# Patient Record
Sex: Female | Born: 1950 | Race: Black or African American | Hispanic: No | Marital: Single | Smoking: Former smoker
Health system: Southern US, Community
[De-identification: ages and names within clinical notes are randomized; demographics above are authoritative.]

## PROBLEM LIST (undated history)

## (undated) DIAGNOSIS — K589 Irritable bowel syndrome without diarrhea: Secondary | ICD-10-CM

## (undated) DIAGNOSIS — I071 Rheumatic tricuspid insufficiency: Secondary | ICD-10-CM

## (undated) DIAGNOSIS — Z923 Personal history of irradiation: Secondary | ICD-10-CM

## (undated) DIAGNOSIS — M1990 Unspecified osteoarthritis, unspecified site: Secondary | ICD-10-CM

## (undated) DIAGNOSIS — Z803 Family history of malignant neoplasm of breast: Secondary | ICD-10-CM

## (undated) DIAGNOSIS — I340 Nonrheumatic mitral (valve) insufficiency: Secondary | ICD-10-CM

## (undated) DIAGNOSIS — K219 Gastro-esophageal reflux disease without esophagitis: Secondary | ICD-10-CM

## (undated) DIAGNOSIS — M1380 Other specified arthritis, unspecified site: Secondary | ICD-10-CM

## (undated) DIAGNOSIS — M48061 Spinal stenosis, lumbar region without neurogenic claudication: Secondary | ICD-10-CM

## (undated) DIAGNOSIS — K224 Dyskinesia of esophagus: Secondary | ICD-10-CM

## (undated) DIAGNOSIS — F419 Anxiety disorder, unspecified: Secondary | ICD-10-CM

## (undated) DIAGNOSIS — Z4651 Encounter for fitting and adjustment of gastric lap band: Secondary | ICD-10-CM

## (undated) DIAGNOSIS — G4730 Sleep apnea, unspecified: Secondary | ICD-10-CM

## (undated) DIAGNOSIS — E669 Obesity, unspecified: Secondary | ICD-10-CM

## (undated) DIAGNOSIS — R42 Dizziness and giddiness: Secondary | ICD-10-CM

## (undated) DIAGNOSIS — M47816 Spondylosis without myelopathy or radiculopathy, lumbar region: Secondary | ICD-10-CM

## (undated) DIAGNOSIS — G4733 Obstructive sleep apnea (adult) (pediatric): Secondary | ICD-10-CM

## (undated) DIAGNOSIS — H269 Unspecified cataract: Secondary | ICD-10-CM

## (undated) HISTORY — DX: Obstructive sleep apnea (adult) (pediatric): G47.33

## (undated) HISTORY — DX: Other specified arthritis, unspecified site: M13.80

## (undated) HISTORY — DX: Spinal stenosis, lumbar region without neurogenic claudication: M48.061

## (undated) HISTORY — DX: Dizziness and giddiness: R42

## (undated) HISTORY — PX: BLEPHAROPLASTY: SUR158

## (undated) HISTORY — PX: CHOLECYSTECTOMY: SHX55

## (undated) HISTORY — DX: Unspecified cataract: H26.9

## (undated) HISTORY — DX: Irritable bowel syndrome, unspecified: K58.9

## (undated) HISTORY — DX: Obesity, unspecified: E66.9

## (undated) HISTORY — PX: UMBILICAL HERNIA REPAIR: SHX196

## (undated) HISTORY — DX: Spondylosis without myelopathy or radiculopathy, lumbar region: M47.816

## (undated) HISTORY — DX: Nonrheumatic mitral (valve) insufficiency: I34.0

## (undated) HISTORY — PX: INGUINAL HERNIA REPAIR: SUR1180

## (undated) HISTORY — DX: Rheumatic tricuspid insufficiency: I07.1

## (undated) HISTORY — PX: EYE SURGERY: SHX253

## (undated) HISTORY — DX: Family history of malignant neoplasm of breast: Z80.3

## (undated) HISTORY — DX: Sleep apnea, unspecified: G47.30

## (undated) HISTORY — DX: Anxiety disorder, unspecified: F41.9

## (undated) HISTORY — PX: COSMETIC SURGERY: SHX468

## (undated) HISTORY — DX: Unspecified osteoarthritis, unspecified site: M19.90

## (undated) HISTORY — DX: Encounter for fitting and adjustment of gastric lap band: Z46.51

---
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
## 1994-12-06 HISTORY — PX: OTHER SURGICAL HISTORY: SHX169

## 2004-11-20 LAB — HM COLONOSCOPY: HM Colonoscopy: NORMAL

## 2005-12-06 HISTORY — PX: LAPAROSCOPIC GASTRIC BANDING: SHX1100

## 2006-01-14 ENCOUNTER — Encounter: Payer: Self-pay | Admitting: Family Medicine

## 2006-08-01 ENCOUNTER — Encounter: Payer: Self-pay | Admitting: Family Medicine

## 2006-09-23 ENCOUNTER — Encounter: Payer: Self-pay | Admitting: Family Medicine

## 2006-09-23 LAB — CONVERTED CEMR LAB: Pap Smear: NORMAL

## 2007-02-09 DIAGNOSIS — M48061 Spinal stenosis, lumbar region without neurogenic claudication: Secondary | ICD-10-CM

## 2007-02-09 DIAGNOSIS — M47816 Spondylosis without myelopathy or radiculopathy, lumbar region: Secondary | ICD-10-CM

## 2007-02-09 HISTORY — DX: Spinal stenosis, lumbar region without neurogenic claudication: M48.061

## 2007-02-09 HISTORY — DX: Spondylosis without myelopathy or radiculopathy, lumbar region: M47.816

## 2007-03-23 DIAGNOSIS — I071 Rheumatic tricuspid insufficiency: Secondary | ICD-10-CM

## 2007-03-23 DIAGNOSIS — I340 Nonrheumatic mitral (valve) insufficiency: Secondary | ICD-10-CM

## 2007-03-23 HISTORY — DX: Rheumatic tricuspid insufficiency: I07.1

## 2007-03-23 HISTORY — DX: Nonrheumatic mitral (valve) insufficiency: I34.0

## 2007-10-16 ENCOUNTER — Encounter: Payer: Self-pay | Admitting: Family Medicine

## 2007-10-16 LAB — CONVERTED CEMR LAB
ALT: 14 units/L
BUN: 14 mg/dL
Glucose, Bld: 82 mg/dL
Potassium: 4.2 meq/L
Sodium: 138 meq/L

## 2007-12-07 HISTORY — PX: BREAST REDUCTION SURGERY: SHX8

## 2008-03-15 ENCOUNTER — Encounter: Payer: Self-pay | Admitting: Family Medicine

## 2008-03-15 LAB — CONVERTED CEMR LAB: Pap Smear: NORMAL

## 2008-06-05 ENCOUNTER — Encounter: Payer: Self-pay | Admitting: Family Medicine

## 2008-06-05 LAB — CONVERTED CEMR LAB
ALT: 29 units/L
Glucose, Bld: 99 mg/dL
HCT: 37.8 %
LDL Cholesterol: 133 mg/dL
Potassium: 5.1 meq/L
Sodium: 145 meq/L

## 2008-12-19 ENCOUNTER — Encounter: Payer: Self-pay | Admitting: Family Medicine

## 2008-12-19 LAB — CONVERTED CEMR LAB: Hgb A1c MFr Bld: 6.6 %

## 2009-03-06 LAB — CONVERTED CEMR LAB

## 2009-04-16 ENCOUNTER — Encounter: Payer: Self-pay | Admitting: Family Medicine

## 2009-04-16 LAB — CONVERTED CEMR LAB
BUN: 15 mg/dL
Glucose, Bld: 94 mg/dL
HDL: 92 mg/dL

## 2010-07-16 ENCOUNTER — Ambulatory Visit: Payer: Self-pay | Admitting: Family Medicine

## 2010-07-16 DIAGNOSIS — R42 Dizziness and giddiness: Secondary | ICD-10-CM | POA: Insufficient documentation

## 2010-07-20 LAB — CONVERTED CEMR LAB
ALT: 14 units/L (ref 0–35)
AST: 20 units/L (ref 0–37)
Albumin: 4.3 g/dL (ref 3.5–5.2)
Alkaline Phosphatase: 80 units/L (ref 39–117)
BUN: 13 mg/dL (ref 6–23)
Bilirubin, Direct: 0.1 mg/dL (ref 0.0–0.3)
CO2: 32 meq/L (ref 19–32)
Calcium: 9.8 mg/dL (ref 8.4–10.5)
Chloride: 107 meq/L (ref 96–112)
Cholesterol: 215 mg/dL — ABNORMAL HIGH (ref 0–200)
Creatinine, Ser: 0.9 mg/dL (ref 0.4–1.2)
Direct LDL: 132.3 mg/dL
GFR calc non Af Amer: 86.1 mL/min (ref >60)
Glucose, Bld: 91 mg/dL (ref 70–99)
HDL: 76.7 mg/dL (ref >39.00)
Potassium: 4.7 meq/L (ref 3.5–5.1)
Sodium: 145 meq/L (ref 135–145)
Total Bilirubin: 0.6 mg/dL (ref 0.3–1.2)
Total CHOL/HDL Ratio: 3
Total Protein: 7.2 g/dL (ref 6.0–8.3)
Triglycerides: 71 mg/dL (ref 0.0–149.0)
VLDL: 14.2 mg/dL (ref 0.0–40.0)

## 2010-07-25 ENCOUNTER — Encounter: Admission: RE | Admit: 2010-07-25 | Discharge: 2010-07-25 | Payer: Self-pay | Admitting: Family Medicine

## 2010-07-27 ENCOUNTER — Telehealth: Payer: Self-pay | Admitting: Family Medicine

## 2010-07-30 ENCOUNTER — Encounter: Payer: Self-pay | Admitting: Family Medicine

## 2010-08-05 ENCOUNTER — Encounter (INDEPENDENT_AMBULATORY_CARE_PROVIDER_SITE_OTHER): Payer: Self-pay | Admitting: *Deleted

## 2010-08-05 ENCOUNTER — Encounter: Payer: Self-pay | Admitting: Family Medicine

## 2010-08-05 DIAGNOSIS — G4733 Obstructive sleep apnea (adult) (pediatric): Secondary | ICD-10-CM | POA: Insufficient documentation

## 2010-11-05 ENCOUNTER — Telehealth: Payer: Self-pay | Admitting: Family Medicine

## 2010-12-04 ENCOUNTER — Encounter: Payer: Self-pay | Admitting: Family Medicine

## 2010-12-09 ENCOUNTER — Encounter: Payer: Self-pay | Admitting: Family Medicine

## 2010-12-09 ENCOUNTER — Encounter (INDEPENDENT_AMBULATORY_CARE_PROVIDER_SITE_OTHER): Payer: Self-pay | Admitting: *Deleted

## 2010-12-21 ENCOUNTER — Encounter: Payer: Self-pay | Admitting: Family Medicine

## 2010-12-21 ENCOUNTER — Ambulatory Visit
Admission: RE | Admit: 2010-12-21 | Discharge: 2010-12-21 | Payer: Self-pay | Source: Home / Self Care | Attending: Family Medicine | Admitting: Family Medicine

## 2010-12-21 ENCOUNTER — Other Ambulatory Visit
Admission: RE | Admit: 2010-12-21 | Discharge: 2010-12-21 | Payer: Self-pay | Source: Home / Self Care | Admitting: Family Medicine

## 2010-12-21 ENCOUNTER — Other Ambulatory Visit: Payer: Self-pay | Admitting: Family Medicine

## 2010-12-28 ENCOUNTER — Ambulatory Visit
Admission: RE | Admit: 2010-12-28 | Discharge: 2010-12-28 | Payer: Self-pay | Source: Home / Self Care | Attending: Family Medicine | Admitting: Family Medicine

## 2010-12-28 ENCOUNTER — Other Ambulatory Visit: Payer: Self-pay | Admitting: Family Medicine

## 2010-12-28 ENCOUNTER — Encounter (INDEPENDENT_AMBULATORY_CARE_PROVIDER_SITE_OTHER): Payer: Self-pay | Admitting: *Deleted

## 2010-12-29 ENCOUNTER — Encounter (INDEPENDENT_AMBULATORY_CARE_PROVIDER_SITE_OTHER): Payer: Self-pay | Admitting: *Deleted

## 2010-12-29 LAB — FECAL OCCULT BLOOD, IMMUNOCHEMICAL: Fecal Occult Bld: NEGATIVE

## 2011-01-05 NOTE — Miscellaneous (Signed)
Clinical Lists Changes  Problems: Added new problem of OBSTRUCTIVE SLEEP APNEA (ICD-327.23) Observations: Added new observation of PAST SURG HX: 1980's   Gall Bladder 1960      Tonsils/Adenoids 1996      Tummy Tuck 2007      Lap Band 2009      Breast Reduction B inguinal hernia repair 2008      Realize Laparoscopic adjustable gastric band. (08/05/2010 16:04) Added new observation of PAST MED HX: Obesity s/p Lab band IBS Anxiety Inflammatory arthritis per rheum 02/09/2007  MRI of Lumbar Spine without IV contrast - Bilateral articular facet hypertrophy and mild foraminal     stenosis at L 4-5 06/18/2008  Lexiscan augmented myocardial perfusion study:  No scintigraphic evidence of reversible ischemia or infarct. Calculated left ventricular ejection fraction of 48% 03/23/2007  Echocardiographic Report:  Borderline LVEF 50%, without segmental wall motion abnormalities.  Mild mitral regurgitation.  Mild to moderate tricuspid regurgitation. OBSTRUCTIVE SLEEP APNEA (ICD-327.23) corrected with use of CPAP at 7 cm. of water pressure FITTING & ADJUSTMENT OF GASTRIC LAP BAND (ICD-V53.51) INTERMITTENT VERTIGO (ICD-780.4) FAMILY HISTORY BREAST CANCER 1ST DEGREE RELATIVE <50 (ICD-V16.3)   (08/05/2010 16:04) Added new observation of MAMMOGRAM: normal (09/23/2009 16:15) Added new observation of PAP SMEAR: normal (04/16/2009 16:06) Added new observation of VIT D 1,25OH: 17.6  (04/16/2009 16:04) Added new observation of CREATININE: 1.05 mg/dL (62/13/0865 78:46) Added new observation of BUN: 15 mg/dL (96/29/5284 13:24) Added new observation of BG RANDOM: 94 mg/dL (40/09/2724 36:64) Added new observation of VLDL: 9 mg/dL (40/34/7425 95:63) Added new observation of LDL: 102 mg/dL (87/56/4332 95:18) Added new observation of HDL: 92 mg/dL (84/16/6063 01:60) Added new observation of TRIGLYC TOT: 46 mg/dL (10/93/2355 73:22) Added new observation of CHOLESTEROL: 203 mg/dL (02/54/2706 23:76) Added new  observation of HGBA1C: 6.6 % (12/19/2008 16:04) Added new observation of HCT: 37.8 % (06/05/2008 16:04) Added new observation of HGB: 12.6 g/dL (28/31/5176 16:07) Added new observation of HGBA1C: 6.2 % (06/05/2008 16:04) Added new observation of PROTEIN, TOT: 7.4 g/dL (37/09/6268 48:54) Added new observation of SGPT (ALT): 29 units/L (06/05/2008 16:04) Added new observation of SGOT (AST): 26 units/L (06/05/2008 16:04) Added new observation of BUN: 13 mg/dL (62/70/3500 93:81) Added new observation of BG RANDOM: 99 mg/dL (82/99/3716 96:78) Added new observation of K SERUM: 5.1 meq/L (06/05/2008 16:04) Added new observation of NA: 145 meq/L (06/05/2008 16:04) Added new observation of VLDL: 14 mg/dL (93/81/0175 10:25) Added new observation of LDL: 133 mg/dL (85/27/7824 23:53) Added new observation of HDL: 70 mg/dL (61/44/3154 00:86) Added new observation of TRIGLYC TOT: 68 mg/dL (76/19/5093 26:71) Added new observation of CHOLESTEROL: 217 mg/dL (24/58/0998 33:82) Added new observation of PAP SMEAR: normal  (03/15/2008 16:05) Added new observation of HGBA1C: 6.1 % (10/16/2007 16:04) Added new observation of PROTEIN, TOT: 6.9 g/dL (50/53/9767 34:19) Added new observation of SGPT (ALT): 14 units/L (10/16/2007 16:04) Added new observation of SGOT (AST): 21 units/L (10/16/2007 16:04) Added new observation of CREATININE: 1.02 mg/dL (37/90/2409 73:53) Added new observation of BUN: 14 mg/dL (29/92/4268 34:19) Added new observation of BG RANDOM: 82 mg/dL (62/22/9798 92:11) Added new observation of K SERUM: 4.2 meq/L (10/16/2007 16:04) Added new observation of NA: 138 meq/L (10/16/2007 16:04) Added new observation of PAP SMEAR: normal  (09/23/2006 16:05) Added new observation of COLONOSCOPY: normal  (11/20/2004 16:18)      Preventive Care Screening  Mammogram:    Date:  09/23/2009    Results:  normal  Pap Smear:    Date:  09/23/2006  Results:  normal   Colonoscopy:    Date:   11/20/2004    Results:  normal      Past History:  Past Medical History: Obesity s/p Lab band IBS Anxiety Inflammatory arthritis per rheum 02/09/2007  MRI of Lumbar Spine without IV contrast - Bilateral articular facet hypertrophy and mild foraminal     stenosis at L 4-5 06/18/2008  Lexiscan augmented myocardial perfusion study:  No scintigraphic evidence of reversible ischemia or infarct. Calculated left ventricular ejection fraction of 48% 03/23/2007  Echocardiographic Report:  Borderline LVEF 50%, without segmental wall motion abnormalities.  Mild mitral regurgitation.  Mild to moderate tricuspid regurgitation. OBSTRUCTIVE SLEEP APNEA (ICD-327.23) corrected with use of CPAP at 7 cm. of water pressure FITTING & ADJUSTMENT OF GASTRIC LAP BAND (ICD-V53.51) INTERMITTENT VERTIGO (ICD-780.4) FAMILY HISTORY BREAST CANCER 1ST DEGREE RELATIVE <50 (ICD-V16.3)    Past Surgical History: 1980's   Gall Bladder 1960      Tonsils/Adenoids 1996      Tummy Tuck 2007      Lap Band 2009      Breast Reduction B inguinal hernia repair 2008      Realize Laparoscopic adjustable gastric band.

## 2011-01-05 NOTE — Miscellaneous (Signed)
  Clinical Lists Changes  Observations: Added new observation of PAST MED HX: Obesity s/p Lab band IBS Anxiety Inflammatory arthritis per rheum 02/09/2007  MRI of Lumbar Spine without IV contrast - Bilateral articular facet hypertrophy and mild foraminal     stenosis at L 4-5 (07/30/2010 10:30)      Past History:  Past Medical History: Obesity s/p Lab band IBS Anxiety Inflammatory arthritis per rheum 02/09/2007  MRI of Lumbar Spine without IV contrast - Bilateral articular facet hypertrophy and mild foraminal     stenosis at L 4-5

## 2011-01-05 NOTE — Letter (Signed)
Summary: Primary Care Consult Scheduled Letter  [place] [facility]  [address] [place], [place] [address]   Phone: [phone]  Fax: [phone]      08/05/2010 MRN: [id number]  [facility] [address] [place], [place]  [address]    Dear Ms. Gow,      We have scheduled an appointment for you.  At the recommendation of Dr.Duncan, we have scheduled you a consult with Dr Jearld Fenton on Friday, September 23rd 2011 at 2:50pm arrival time.  Their address is [facility] [address] [place] The office phone number is [phone].  If this appointment day and time is not convenient for you, please feel free to call the office of the doctor you are being referred to at the number listed above and reschedule the appointment.     It is important for you to keep your scheduled appointments. We are here to make sure you are given good patient care. If you have questions or you have made changes to your appointment, please notify us at  [phone]         , ask for              .    Thank you,  Patient Care Coordinator [place] at [facility]

## 2011-01-05 NOTE — Progress Notes (Signed)
  Phone Note Outgoing Call    Follow-up for Phone Call        LMOVM for patient to call back. MRI/MRA w/o source of symptoms seen.  Will d/w patient [id number] when she calls back.   Follow-up by: Crawford Givens MD,  July 27, 2010 8:59 AM  Additional Follow-up for Phone Call Additional follow up Details #1::        Pt called.  Vertigo is mostly resolved.  Ringing in ears persists.  I think it would be reasonable to have patient see ENT for their opinion.  No other work up in the meantime.  She agrees.  Additional Follow-up by: Crawford Givens MD,  July 28, 2010 8:25 AM       Impression & Recommendations:  Problem # 1:  INTERMITTENT VERTIGO (ICD-780.4)  Orders: ENT Referral (ENT)  Complete Medication List: 1)  Plaquenil 200 Mg Tabs (Hydroxychloroquine sulfate) .... Take 1 tablet by mouth once a day 2)  Hydrocodone-acetaminophen 10-325 Mg Tabs (Hydrocodone-acetaminophen) .... One tablet every 4 to 6 hours as needed 3)  Librax 2.5-5 Mg Caps (Clidinium-chlordiazepoxide) .... As needed 4)  Xanax 0.5 Mg Tabs (Alprazolam) .... As needed 5)  Vitamin D 1000 Unit Tabs (Cholecalciferol) .... Once daily 6)  Vitamin C 500 Mg Tabs (Ascorbic acid) .... Once daily 7)  Co Q-10 30 Mg Caps (Coenzyme q10) .... Once daily 8)  Folic Acid Powd (Folic acid) .... Once daily 9)  Aspir-low 81 Mg Tbec (Aspirin) .Marland Kitchen.. 1 by mouth qday

## 2011-01-05 NOTE — Progress Notes (Signed)
Summary: librax   Phone Note Refill Request Message from:  Patient on November 05, 2010 11:10 AM  Refills Requested: Medication #1:  LIBRAX 2.5-5 MG CAPS as needed [facility] [address]    Follow-up for Phone Call        please call in.  thanks.  Follow-up by: Crawford Givens MD,  November 05, 2010 11:49 AM  Additional Follow-up for Phone Call Additional follow up Details #1::        Medication phoned to pharmacy.  Additional Follow-up by: Delilah Shan CMA (AAMA),  November 05, 2010 12:04 PM    New/Updated Medications: LIBRAX 2.5-5 MG CAPS (CLIDINIUM-CHLORDIAZEPOXIDE) 1 po tid as needed Prescriptions: LIBRAX 2.5-5 MG CAPS (CLIDINIUM-CHLORDIAZEPOXIDE) 1 po tid as needed  #90 x 3   Entered and Authorized by:   Crawford Givens MD   Signed by:   Crawford Givens MD on 11/05/2010   Method used:   Telephoned to ...       [id number]       [address]       [place], [place]  [address]       Ph: [phone] or [phone]       Fax: [phone]   RxID:   [id number]

## 2011-01-05 NOTE — Assessment & Plan Note (Signed)
Summary: NEW PT TO ESTABH/   Vital Signs:  Patient profile:   60 year old female Height:      69 inches Weight:      187.75 pounds BMI:     27.83 Temp:     98.3 degrees F oral Pulse rate:   84 / minute Pulse rhythm:   regular BP sitting:   112 / 72  (left arm) Cuff size:   large  Vitals Entered By: Delilah Shan CMA  Dull) (July 16, 2010 9:27 AM) CC: New Patient to Establish   History of Present Illness: Woke up with room spinning, passed after about 3-4 hours, initial episode was 3 months ago. Temp vision change in L eye, blurry.  Vision sx resolved after a few days.  Vomited with intial episode.  Still with positional changes that induce room spinning w/o vision change now.  Persistent ear ringing at the time of the event.  Ringing in ear seems to be more on the L side.  No speech changes, no motor changes.  No prev hx.  +FH CVA.   Preventive Screening-Counseling & Management  Alcohol-Tobacco     Smoking Status: quit  Caffeine-Diet-Exercise     Does Patient Exercise: no      Drug Use:  no.    Allergies (verified): No Known Drug Allergies  Past History:  Past Medical History: Obesity s/p Lab band IBS Anxiety Inflammatory arthritis per rheum  Past Surgical History: 1980's   Gall Bladder 1960      Tonsils/Adenoids 1996      Tummy Tuck 2007      Lap Band 2009      Breast Reduction B inguinal hernia repair  Family History: Reviewed history and no changes required. Family History Breast cancer 1st degree relative <50, parents Family History of Stroke F 1st degree relative <60, parents F alive, CVA M dead, Breast CA  Social History: Reviewed history and no changes required. 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  Otherwise negative.    Physical Exam  General:  GEN: nad, alert and oriented HEENT: mucous membranes moist NECK: supple w/o LA CV: rrr.  no murmur PULM: ctab, no inc wob ABD: soft, +bs EXT: no edema SKIN: no acute rash  CN 2-12 wnl B, S/S/DTR wnl x4, DHP with mild vertigo symptoms   Impression & Recommendations:  Problem # 1:  INTERMITTENT VERTIGO (ICD-780.4) concern for CVA d/w patient, esp with FH and vision symptoms.  No bruit today.  normal exam except for + DHP.  will image and check basic labs.  She agree with plan.  Orders: TLB-Lipid Panel (80061-LIPID) TLB-BMP (Basic Metabolic Panel-BMET) (80048-METABOL) TLB-Hepatic/Liver Function Pnl (80076-HEPATIC) Radiology Referral (Radiology)  Problem # 2:  FITTING & ADJUSTMENT OF GASTRIC LAP BAND (ICD-V53.51) Refer to [facility] for eval and to establish with them.  Orders: Surgical Referral (Surgery)  Complete Medication List: 1)  Plaquenil 200 Mg Tabs (Hydroxychloroquine sulfate) .... Take 1 tablet by mouth once a day 2)  Hydrocodone-acetaminophen 10-325 Mg Tabs (Hydrocodone-acetaminophen) .... One tablet every 4 to 6 hours as needed 3)  Librax 2.5-5 Mg Caps (Clidinium-chlordiazepoxide) .... As  needed 4)  Xanax 0.5 Mg Tabs (Alprazolam) .... As needed 5)  Vitamin D 1000 Unit Tabs (Cholecalciferol) .... Once daily 6)  Vitamin C 500 Mg Tabs (Ascorbic acid) .... Once daily 7)  Co Q-10 30 Mg Caps (Coenzyme q10) .... Once daily 8)  Folic Acid Powd (Folic acid) .... Once daily 9)  Aspir-low 81 Mg Tbec (Aspirin) .Marland Kitchen.. 1 by mouth qday  Patient Instructions: 1)  See Shirlee Limerick about your referral before your leave today.  We'll contact you with your lab and radiology reports.  Keep taking aspirin 81mg  a day.   Current Allergies (reviewed today): No known allergies    Preventive Care  Screening  Mammogram:    Date:  09/05/2009    Results:  Done   Pap Smear:    Date:  03/06/2009    Results:  Done   Colonoscopy:    Date:  12/06/2002    Results:  Done     Immunization History:  Pneumovax Immunization History:    Pneumovax:  pneumovax (12/06/2008)

## 2011-01-07 NOTE — Letter (Signed)
Summary: Results Follow up Letter  [place] at [facility]  [address] [place], [place] [address]   Phone: [phone]  Fax: [phone]    12/28/2010 MRN: [id number]    [facility] [address] [place], [place]  [address]    Dear Ms. Hiebert,  The following are the results of your recent test(s):  Test         Result    Pap Smear:        Normal __X___  Not Normal _____ Comments: ______________________________________________________ Cholesterol: LDL(Bad cholesterol):         Your goal is less than:         HDL (Good cholesterol):       Your goal is more than: Comments:  ______________________________________________________ Mammogram:        Normal _____  Not Normal _____ Comments:  ___________________________________________________________________ Hemoccult:        Normal _____  Not normal _______ Comments:    _____________________________________________________________________ Other Tests:    We routinely do not discuss normal results over the telephone.  If you desire a copy of the results, or you have any questions about this information we can discuss them at your next office visit.   Sincerely,    Dwana Curd. Para March, M.D.  [facility]

## 2011-01-07 NOTE — Letter (Signed)
Summary: Results Follow up Letter  [place] at [facility]  [address] [place], [place] [address]   Phone: [phone]  Fax: [phone]    12/09/2010 MRN: [id number]    [facility] [address] [place], [place]  [address]    Dear Ms. Piazza,  The following are the results of your recent test(s):  Test         Result    Pap Smear:        Normal _____  Not Normal _____ Comments: ______________________________________________________ Cholesterol: LDL(Bad cholesterol):         Your goal is less than:         HDL (Good cholesterol):       Your goal is more than: Comments:  ______________________________________________________ Mammogram:        Normal __X___  Not Normal _____ Comments:  Yearly follow up is recommended.   ___________________________________________________________________ Hemoccult:        Normal _____  Not normal _______ Comments:    _____________________________________________________________________ Other Tests:    We routinely do not discuss normal results over the telephone.  If you desire a copy of the results, or you have any questions about this information we can discuss them at your next office visit.   Sincerely,    Dwana Curd. Para March, M.D.  [facility]

## 2011-01-07 NOTE — Miscellaneous (Signed)
  Clinical Lists Changes  Observations: Added new observation of MAMMO DUE: 12/05/2011 (12/04/2010 16:59) Added new observation of MAMMOGRAM: Normal (12/04/2010 16:59)

## 2011-01-07 NOTE — Letter (Signed)
Summary: [facility]: Immunoassay Fecal Occult Blood (iFOB) Order Form  [place] at [facility]  [address] [place], [place] [address]   Phone: [phone]  Fax: [phone]      [facility]: Immunoassay Fecal Occult Blood (iFOB) Order Form   December 21, 2010 MRN: [id number]   Dana Harding September 22, 1951   Physicican Name:_____[email or web address]____________________  Diagnosis Code:_______duncan___________________      Crawford Givens MD

## 2011-01-07 NOTE — Assessment & Plan Note (Signed)
Summary: CPX  W/ PAP SMEAR...  CYD   Vital Signs:  Patient profile:   60 year old female Height:      69 inches Weight:      191.25 pounds BMI:     28.34 Temp:     98.4 degrees F oral Pulse rate:   88 / minute Pulse rhythm:   regular BP sitting:   122 / 86  (left arm) Cuff size:   large  Vitals Entered By: Delilah Shan CMA (AAMA) (December 21, 2010 11:30 AM) CC: CPX w/ Pap, Preventive Care   History of Present Illness: CPE- See plan.  Update on other medical problems:  Saw Dr. Jearld Fenton with ENT re: vertigo and did much better in the interval.  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    Has follow up with bariatric clinic re: prev banding (Dr. Adolphus Birchwood).  Had abnormal barium swallow with reflux.  Episodic use librax and xanax with relief.   Seeing Dr. Corliss Skains with rheum.  CPAP not needed now.  Pt w/o sleep changes noted off the CPAP.   Current Medications (verified): 1)  Hydrocodone-Acetaminophen 10-325 Mg Tabs (Hydrocodone-Acetaminophen) .... One Tablet Every 4 To 6 Hours As Needed 2)  Librax 2.5-5 Mg Caps (Clidinium-Chlordiazepoxide) .Marland Kitchen.. 1 Po Tid As Needed 3)  Xanax 0.5 Mg Tabs (Alprazolam) .... As Needed 4)  Vitamin D 1000 Unit  Tabs (Cholecalciferol) .... Once Daily 5)  Vitamin C 500 Mg  Tabs (Ascorbic Acid) .... Once Daily 6)  Co Q-10 30 Mg  Caps (Coenzyme Q10) .... Once Daily 7)  Folic Acid   Powd (Folic Acid) .... Once Daily 8)  Aspir-Low 81 Mg Tbec (Aspirin) .Marland Kitchen.. 1 By Mouth Qday 9)  Robaxin 500 Mg Tabs (Methocarbamol) .... 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 Calculated left ventricular ejection fraction of 48% 03/23/2007  Echocardiographic Report:  Borderline LVEF 50%, without segmental wall motion abnormalities.  Mild mitral regurgitation.  Mild to moderate tricuspid regurgitation. OBSTRUCTIVE SLEEP APNEA (ICD-327.23) corrected with use of CPAP at 7 cm. of water pressure, not needed as of 2012 FITTING & ADJUSTMENT OF GASTRIC LAP BAND (ICD-V53.51) INTERMITTENT VERTIGO (ICD-780.4) FAMILY HISTORY BREAST CANCER 1ST DEGREE RELATIVE <50 (ICD-V16.3)    Past Surgical History: 1980's   Gall Bladder 1960      Tonsils/Adenoids 1996      Tummy Tuck 2007      Lap Band 2009      Breast Reduction B inguinal hernia repair 2008      Realize Laparoscopic adjustable gastric band. 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  Otherwise negative.     Physical Exam  General:  GEN: nad, alert and oriented HEENT: mucous membranes moist, tm w/o erythema x2 NECK: supple w/o LA CV: rrr. PULM: ctab, no inc wob ABD: soft, +bs EXT: no edema SKIN: no acute rash  Breasts:  No mass, nodules, thickening, tenderness, bulging, retraction, inflamation, nipple discharge or skin changes noted.  chaperoned exam Genitalia:  chaperoned exam.  Normal introitus for age, no external lesions, no vaginal discharge, mucosa pink and moist, no vaginal or cervical lesions, no vaginal atrophy, no friaility or hemorrhage, normal uterus size and position, no adnexal masses or tenderness except for small (and prev identified) fibroid on exam- this wasn't tender to palpation.  Pap collected.    Impression & Recommendations:  Problem # 1:  Preventive Health Care (ICD-V70.0) Pap today.  Colonoscopy should be up to date, due for recheck in 2013. Sent home wiht IFOB.  Mammogram up to date (I d/w patient [id number] breast exams) and flu shot encouraged.  Td up to date.  D/w patient [id number] and exericse.  She is going to follow up with bariatric clinic.  Doing well o/w.  I reviewed last set up labs.  She isn't due for repeats now.  Can recheck labs at next cpe, sooner if needed.    Complete Medication List: 1)  Hydrocodone-acetaminophen 10-325 Mg Tabs (Hydrocodone-acetaminophen) .... One tablet every 4 to 6 hours as needed 2)  Librax 2.5-5 Mg Caps (Clidinium-chlordiazepoxide) .Marland Kitchen.. 1 po tid as needed 3)  Xanax 0.5 Mg Tabs (Alprazolam) .... As needed 4)  Vitamin D 1000 Unit Tabs (Cholecalciferol) .... Once daily 5)  Vitamin C 500 Mg Tabs (Ascorbic acid) .... Once daily 6)  Co Q-10 30 Mg Caps (Coenzyme q10) .... Once daily 7)  Folic Acid Powd (Folic acid) .... Once daily 8)  Aspir-low 81 Mg Tbec (Aspirin) .Marland Kitchen.. 1 by mouth qday 9)  Robaxin 500 Mg Tabs (Methocarbamol) .... Take 1 tablet by mouth three times a day as needed  Colorectal Screening:  Current Recommendations:     Hemoccult: Given x 3  PAP Screening:    Last PAP smear:  04/16/2009    Reviewed PAP smear recommendations:  PAP smear done  Mammogram Screening:    Last Mammogram:  12/04/2010  Osteoporosis Risk Assessment:  Risk Factors for Fracture or Low Bone Density:   Smoking status:       quit  Immunization & Chemoprophylaxis:    Tetanus vaccine: Td  (12/07/2007)    Pneumovax: Pneumovax  (12/06/2008)  Patient Instructions: 1)  I would get a flu shot.  Please ask Dr. Adolphus Birchwood to send a copy of the notes along.   2)  We'll contact you with your Pap report.  3)  Take care.  Glad to see you.  4)  I would check your labs a physical next year.  Let me know if you have concerns in the meantime.    Orders Added: 1)  Est. Patient 40-64 years [[id number]]  Immunization History:  Tetanus/Td Immunization History:    Tetanus/Td:  td (12/07/2007)    Tetanus/Td:  td (12/07/2007)   Immunization History:  Tetanus/Td Immunization History:    Tetanus/Td:  Td (12/07/2007)  Current Allergies (reviewed today): No known allergies    Preventive Care Screening  Last Tetanus Booster:    Date:  12/07/2007    Results:  Td

## 2011-01-07 NOTE — Letter (Signed)
Summary: Results Follow up Letter  [place] [facility]  [address] [place], [place] [address]   Phone: [phone]  Fax: [phone]    12/29/2010 MRN: [id number]    [facility] [place] [address] [place], [place]  [address]    Dear Ms. Aicher,  The following are the results of your recent test(s):  Test         Result    Pap Smear:        Normal _____  Not Normal _____ Comments: ______________________________________________________ Cholesterol: LDL(Bad cholesterol):         Your goal is less than:         HDL (Good cholesterol):       Your goal is more than: Comments:  ______________________________________________________ Mammogram:        Normal _____  Not Normal _____ Comments:  ___________________________________________________________________ Hemoccult:        Normal ___X__  Not normal _______ Comments:    Yearly follow up is recommended.   _____________________________________________________________________ Other Tests:    We routinely do not discuss normal results over the telephone.  If you desire a copy of the results, or you have any questions about this information we can discuss them at your next office visit.   Sincerely,    Dwana Curd. Para March, M.D.  [facility]

## 2011-03-31 ENCOUNTER — Telehealth: Payer: Self-pay | Admitting: *Deleted

## 2011-03-31 MED ORDER — PENCICLOVIR 1 % EX CREA: TOPICAL_CREAM | CUTANEOUS | Status: AC

## 2011-03-31 NOTE — Telephone Encounter (Signed)
Done

## 2011-03-31 NOTE — Telephone Encounter (Signed)
Patient says that she has a couple of cold sores and is asking if she could get a rx for denavir cream sent to [address].

## 2011-04-28 ENCOUNTER — Other Ambulatory Visit: Payer: Self-pay | Admitting: *Deleted

## 2011-04-28 MED ORDER — ALPRAZOLAM 0.5 MG PO TABS: ORAL_TABLET | ORAL | Status: DC

## 2011-04-28 NOTE — Telephone Encounter (Signed)
[facility] 

## 2011-04-28 NOTE — Telephone Encounter (Signed)
Please call in

## 2011-07-05 ENCOUNTER — Encounter: Payer: Self-pay | Admitting: Family Medicine

## 2011-07-06 ENCOUNTER — Encounter: Payer: Self-pay | Admitting: Family Medicine

## 2011-07-06 ENCOUNTER — Ambulatory Visit (INDEPENDENT_AMBULATORY_CARE_PROVIDER_SITE_OTHER): Payer: BC Managed Care – PPO | Admitting: Family Medicine

## 2011-07-06 DIAGNOSIS — G4733 Obstructive sleep apnea (adult) (pediatric): Secondary | ICD-10-CM

## 2011-07-06 DIAGNOSIS — M549 Dorsalgia, unspecified: Secondary | ICD-10-CM

## 2011-07-06 DIAGNOSIS — R42 Dizziness and giddiness: Secondary | ICD-10-CM

## 2011-07-06 DIAGNOSIS — R21 Rash and other nonspecific skin eruption: Secondary | ICD-10-CM

## 2011-07-06 DIAGNOSIS — K589 Irritable bowel syndrome without diarrhea: Secondary | ICD-10-CM

## 2011-07-06 DIAGNOSIS — Z9884 Bariatric surgery status: Secondary | ICD-10-CM

## 2011-07-06 MED ORDER — TRIAMCINOLONE ACETONIDE 0.1 % EX CREA: TOPICAL_CREAM | Freq: Two times a day (BID) | CUTANEOUS | Status: AC

## 2011-07-06 NOTE — Progress Notes (Signed)
She's doing well with grandmotherhood.  She's stopped work in [place], now working in [employer].  Less travel is good for her.    She had been seeing Dr. Corliss Skains for a presumed inflammatory arthritis.  She was asking about what was causing the pain.  She'd like to get to he root of the problem and get on the least amount of pain meds.  She prev had elevated ANA, treated with plaquenil w/o sig improvement in pain. She's asking what can be done at this point.  I reviewed recent records from Dr. Corliss Skains.    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   She continues to have aching in the lower back with occ flares of back pain higher up.  She describes pain in greater troch area with walking.  No focal dec in sensation, no weakness and no radicular sx in the legs.  No recent trauma.   No fever, no bowel/bladder dysfunction, other than h/o IBS and esophageal spasm.  She is s/p lap band surgery.  She isn't on NSAIDS.  She is s/p PT w/o sig change in sx.    Rash on L side of neck started 2-3 months ago.  It crossed the midline.  Red and irritated, and then developed similar rash on L forearm.  Still itches and is worse in the heat.  No triggers known o/w.  No prev sx like this.  No h/o zoster.    Past Medical History  Diagnosis Date  . Obesity   . IBS (irritable bowel syndrome)   . Anxiety   . Facet hypertrophy of lumbar region 02/09/07    bilateral articular L4-5; per MRI  . Lumbar foraminal stenosis 02/09/07    mild; L4-5; per MRI  . Mitral regurgitation 03/23/07    mild  . Tricuspid regurgitation 03/23/07    mild to moderate  . Obstructive sleep apnea (adult) (pediatric)     CPAP  . Fitting and adjustment of gastric lap band   . Dizziness and giddiness     intermittent  . Family history of malignant neoplasm of breast   . Inflammatory arthritis      H/o elevated ANA, treated with plaquenil w/o sig change in  back pain   Past Surgical History  Procedure Date  . Cholecystectomy 1980's  . Tonsillectomy and adenoidectomy 1960  . Tummy tuck 1996  . Laparoscopic gastric banding 2007  . Breast reduction surgery 2009  . Inguinal hernia repair     bilateral  . Laparoscopic gastric banding 2008    Realize    Meds, vitals, and allergies reviewed.   ROS: See HPI.  Otherwise, noncontributory.  nad ncat Mmm Neck supple rrr ctab Skin with faint erythematous maculopapular rash on anterior upper chest and L forearm Back w/ midline pain and bilateral T and L spine paraspinal tenderness, worse with spine extension.  No overlaying skin changes in the area. Normal ROM at the hips w/o radicular sx.  Distally nv intact.

## 2011-07-06 NOTE — Patient Instructions (Signed)
Use the cream twice a day as needed for itching and I'll call you about your back pain.  Take care.  Glad to see you.

## 2011-07-07 ENCOUNTER — Telehealth: Payer: Self-pay | Admitting: Family Medicine

## 2011-07-07 ENCOUNTER — Encounter: Payer: Self-pay | Admitting: Family Medicine

## 2011-07-07 DIAGNOSIS — Z9884 Bariatric surgery status: Secondary | ICD-10-CM | POA: Insufficient documentation

## 2011-07-07 DIAGNOSIS — M549 Dorsalgia, unspecified: Secondary | ICD-10-CM | POA: Insufficient documentation

## 2011-07-07 DIAGNOSIS — R21 Rash and other nonspecific skin eruption: Secondary | ICD-10-CM | POA: Insufficient documentation

## 2011-07-07 DIAGNOSIS — R42 Dizziness and giddiness: Secondary | ICD-10-CM | POA: Insufficient documentation

## 2011-07-07 DIAGNOSIS — K589 Irritable bowel syndrome without diarrhea: Secondary | ICD-10-CM | POA: Insufficient documentation

## 2011-07-07 NOTE — Telephone Encounter (Signed)
Please call pt.  The more I think about it (and given what we know about her back), the more I think we should try to set her up with the spine clinic.  I don't know if she'll be a candidate for facet/back injections, but his is likely a better option than trying different oral meds.  I would like their input.  Please call her and see if she's okay with the referral.  Thanks.

## 2011-07-07 NOTE — Assessment & Plan Note (Signed)
I'll ask that the spine clinic eval the patient.  I would like their input on the possibility of injection for pain control.  I would like to avoid NSAIDS due to h/o gastric band and esophageal spasm.  No change in meds for now.  >25 min spent with face to face with patient, >50% counseling and/or coordinating care.

## 2011-07-07 NOTE — Assessment & Plan Note (Signed)
Unclear source.  Use topical steroid for now for itching, with precautions, and then have pt call back as needed.  She agrees.

## 2011-07-08 NOTE — Telephone Encounter (Signed)
Tried calling number in chart and got message stating that the number could not be reached.

## 2011-07-08 NOTE — Telephone Encounter (Signed)
Left message for patient to return my call.

## 2011-07-12 ENCOUNTER — Telehealth: Payer: Self-pay | Admitting: *Deleted

## 2011-07-12 ENCOUNTER — Encounter: Payer: Self-pay | Admitting: *Deleted

## 2011-07-12 NOTE — Telephone Encounter (Signed)
Opened in error

## 2011-07-12 NOTE — Telephone Encounter (Signed)
Tried calling only number in chart and was told that it was not the correct number for patient. Letter mailed to patient.

## 2011-07-13 ENCOUNTER — Telehealth: Payer: Self-pay | Admitting: *Deleted

## 2011-07-13 DIAGNOSIS — M549 Dorsalgia, unspecified: Secondary | ICD-10-CM

## 2011-07-13 NOTE — Telephone Encounter (Signed)
Patient returned called the office. She said that she does agree with referral to the spine clinic. She will wait to here from [facility].

## 2011-07-13 NOTE — Telephone Encounter (Signed)
Referral ordered

## 2011-08-23 ENCOUNTER — Other Ambulatory Visit: Payer: Self-pay | Admitting: *Deleted

## 2011-08-23 NOTE — Telephone Encounter (Signed)
Phoned request from pt for refill on librax, [facility] [address]

## 2011-08-24 MED ORDER — CILIDINIUM-CHLORDIAZEPOXIDE 2.5-5 MG PO CAPS: 1.0000 | ORAL_CAPSULE | Freq: Three times a day (TID) | ORAL | Status: DC | PRN

## 2011-08-24 NOTE — Telephone Encounter (Signed)
Refilled

## 2011-11-29 ENCOUNTER — Ambulatory Visit: Payer: BC Managed Care – PPO | Admitting: Family Medicine

## 2011-12-30 ENCOUNTER — Other Ambulatory Visit: Payer: Self-pay | Admitting: Family Medicine

## 2011-12-30 NOTE — Telephone Encounter (Signed)
Will route to pcp 

## 2011-12-31 NOTE — Telephone Encounter (Signed)
Sent!

## 2012-05-02 ENCOUNTER — Encounter: Payer: Self-pay | Admitting: Family Medicine

## 2012-05-03 ENCOUNTER — Encounter: Payer: Self-pay | Admitting: *Deleted

## 2012-07-24 ENCOUNTER — Telehealth: Payer: Self-pay

## 2012-07-24 MED ORDER — EFLORNITHINE HCL 13.9 % EX CREA: TOPICAL_CREAM | CUTANEOUS | Status: DC

## 2012-07-24 NOTE — Telephone Encounter (Signed)
Pt request Vaniqa for hair removal; pt had been getting Vaniqa while in [place]. Pt scheduled appt on 07/27/12 but wants to know if Dr Para March will give Vaniqa without appt; pt going out of town on 07/28/12 for one week and wants to take med with her on trip.[facility].Please advise.

## 2012-07-24 NOTE — Telephone Encounter (Signed)
Sent!

## 2012-07-27 ENCOUNTER — Ambulatory Visit: Payer: BC Managed Care – PPO | Admitting: Family Medicine

## 2012-10-16 ENCOUNTER — Ambulatory Visit (INDEPENDENT_AMBULATORY_CARE_PROVIDER_SITE_OTHER): Payer: BC Managed Care – PPO | Admitting: Family Medicine

## 2012-10-16 ENCOUNTER — Encounter: Payer: Self-pay | Admitting: Family Medicine

## 2012-10-16 VITALS — BP 114/88 | HR 75 | Temp 98.4°F | Wt 186.0 lb

## 2012-10-16 DIAGNOSIS — Z23 Encounter for immunization: Secondary | ICD-10-CM

## 2012-10-16 DIAGNOSIS — R109 Unspecified abdominal pain: Secondary | ICD-10-CM

## 2012-10-16 DIAGNOSIS — M25519 Pain in unspecified shoulder: Secondary | ICD-10-CM | POA: Insufficient documentation

## 2012-10-16 NOTE — Progress Notes (Signed)
She hasn't need as much pain medicine for her back recently.    Now with L shoulder pain.  Getting worse over last year, longstanding.  R handed.  No specific injury.  Occ pain on L upper lateral arm.  Pain sleeping on L side.  Pain with certain movements.  No popping, clicking. Grip is wnl.  No R sided sx.   Exercising, moving certain ways, occ sitting, will feel LLQ abd pain.  Going episodically for months, since spring 2013.  Not getting worse, "but since I'm here, I'm telling you about it."  No vomiting, diarrhea, blood in stool, fevers, rash.  H/o BIH repair, no h/o hysterectomy.  Better if wearing certain belts around the abd.  Meds, vitals, and allergies reviewed.   ROS: See HPI.  Otherwise, noncontributory.  nad ncat Mmm rrr ctab abd soft, not ttp except for testing LLQ/oblique with abd wall ttp w/o rash L should without arm drop and AC not ttp but pain on int>ext rotation, + impingement, + scap assist.  Distally nv intact

## 2012-10-16 NOTE — Patient Instructions (Addendum)
Use the shoulder exercises and let me know if you don't improve (or the abdominal wall pain gets worse).  Take care. Glad to see you.

## 2012-10-16 NOTE — Assessment & Plan Note (Signed)
Likely cuff strain, no arm drop, going on for months.  Would be reasonable for home exercise program, d/w pt and handout given.  If improved, then no f/u needed. If not improved, she'll call back and we'll refer to ortho. She agrees. Anatomy d/w pt.

## 2012-10-16 NOTE — Assessment & Plan Note (Signed)
Likely benign/chronic strain with episodic exacerbation.  F/u prn.  Reassuring exam w/o red flag sx.

## 2012-10-17 DIAGNOSIS — Z23 Encounter for immunization: Secondary | ICD-10-CM

## 2012-10-17 NOTE — Addendum Note (Signed)
Addended by: Annamarie Major on: 10/17/2012 09:35 AM   Modules accepted: Orders

## 2012-11-15 ENCOUNTER — Other Ambulatory Visit: Payer: Self-pay | Admitting: Family Medicine

## 2012-11-15 NOTE — Telephone Encounter (Signed)
Please call in hydrocodone.  Robaxin sent.

## 2012-11-16 NOTE — Telephone Encounter (Signed)
Rx phoned to pharmacy.  

## 2012-12-26 ENCOUNTER — Telehealth: Payer: Self-pay | Admitting: Family Medicine

## 2012-12-26 DIAGNOSIS — K589 Irritable bowel syndrome without diarrhea: Secondary | ICD-10-CM

## 2012-12-26 NOTE — Telephone Encounter (Signed)
Pt called asking for Dr. Para March to give her a referral for a gastroenterologist.  She says this is the third time she has called.  She would like to be referred to Dr. Loreta Ave, office # [phone].

## 2012-12-26 NOTE — Telephone Encounter (Signed)
Referral is in. Thanks.

## 2013-02-09 ENCOUNTER — Encounter (HOSPITAL_COMMUNITY): Payer: Self-pay | Admitting: Pharmacy Technician

## 2013-02-13 NOTE — Pre-Procedure Instructions (Signed)
Dana Harding  02/13/2013   Your procedure is scheduled on:  Thursday, March 20th  Report to [facility] at 0800 AM.  Call this number if you have problems the morning of surgery: [phone]   Remember:   Do not eat food or drink liquids after midnight.    Take these medicines the morning of surgery with A SIP OF WATER: xanax if needed, vicodin if needed   Do not wear jewelry, make-up or nail polish.  Do not wear lotions, powders, or perfumes,deodorant.  Do not shave 48 hours prior to surgery. .  Do not bring valuables to the hospital.  Contacts, dentures or bridgework may not be worn into surgery.  Leave suitcase in the car. After surgery it may be brought to your room.  For patients admitted to the hospital, checkout time is 11:00 AM the day of discharge.   Patients discharged the day of surgery will not be allowed to drive home.   Special Instructions: Shower using CHG 2 nights before surgery and the night before surgery.  If you shower the day of surgery use CHG.  Use special wash - you have one bottle of CHG for all showers.  You should use approximately 1/3 of the bottle for each shower.   Please read over the following fact sheets that you were given: Pain Booklet, Coughing and Deep Breathing, MRSA Information and Surgical Site Infection Prevention

## 2013-02-14 ENCOUNTER — Encounter (HOSPITAL_COMMUNITY): Payer: Self-pay

## 2013-02-14 ENCOUNTER — Encounter (HOSPITAL_COMMUNITY)
Admission: RE | Admit: 2013-02-14 | Discharge: 2013-02-14 | Disposition: A | Discharge status: Home / Self Care | Payer: BC Managed Care – PPO | Source: Ambulatory Visit | Attending: Orthopedic Surgery | Admitting: Orthopedic Surgery

## 2013-02-14 ENCOUNTER — Encounter (HOSPITAL_COMMUNITY)
Admission: RE | Admit: 2013-02-14 | Discharge: 2013-02-14 | Disposition: A | Discharge status: Home / Self Care | Payer: BC Managed Care – PPO | Source: Ambulatory Visit | Attending: Anesthesiology | Admitting: Anesthesiology

## 2013-02-14 HISTORY — DX: Dyskinesia of esophagus: K22.4

## 2013-02-14 LAB — CBC
MCH: 26.3 pg (ref 26.0–34.0)
MCHC: 33 g/dL (ref 30.0–36.0)
MCV: 79.8 fL (ref 78.0–100.0)
Platelets: 284 10*3/uL (ref 150–400)
RBC: 4.86 MIL/uL (ref 3.87–5.11)

## 2013-02-14 LAB — BASIC METABOLIC PANEL
CO2: 26 mEq/L (ref 19–32)
Calcium: 9.5 mg/dL (ref 8.4–10.5)
Creatinine, Ser: 0.9 mg/dL (ref 0.50–1.10)

## 2013-02-14 NOTE — Progress Notes (Addendum)
Patient originally from [place].  She had lap band surgery and 'was required' to get cardiac workup.  Dr. Darlina Rumpf, of [place]  (PCP)  [phone].  However, in some earlier notes, i noted the phrases "mitral & tricuspid regurge". Pt is not aware of either diagnosis.  While being heavy, she had sleep apnea, but since losing 55 lbs, she no longer needs machine nor does she wear it, and does NOT know the settings.  (tested in ??2007)  Was having some 'chest discomfort' and has seen Dr. Loreta Ave here in [place], and has come up with tentative dx esophageal spasms (suppose to be tested later this month).  Dr.Michael Fidela Salisbury [phone]) was cardio in [place].  We will be calling them (they are 3 hrs behind [place]) to try and obtain any and all info needed.  Pt does, however, deny all cardiac problems.   Dr. Crawford Givens -PCP in [place], [place]  [address]  Received sleep study...Marland KitchenMarland KitchenMarland Kitchenwill check proficient again for cardiac notes...da [phone]  Nothing in proficient for any cardiac noted...da

## 2013-02-16 LAB — HM COLONOSCOPY
HM Colonoscopy: NORMAL
HM Colonoscopy: NORMAL

## 2013-02-16 NOTE — Progress Notes (Signed)
Anesthesia Chart Review:  Patient is a 62 year old female scheduled for left shoulder arthroscopy, subacromial decompression, distal clavicle resection, possible rotator cuff repair on 02/22/13 by Dr. Rennis Chris.  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  Her history includes former smoker, IBS, anxiety, obesity s/p laparoscopic gastric banding, OSA without need for CPAP following 55 lb weight loss, inflammatory arthritis, esophageal spasm, T&A, breast reduction, cholecystectomy, inguinal hernia repair.  She reported that prior to her lap band procedure she was required to undergo a cardiac work-up.  Records were requested from cardiologist Dr. Laqueta Linden in [place], but according to Dr. Joaquim Nam (PCP) office note on 12/21/10: 06/18/2008 Lexiscan augmented myocardial perfusion study: No scintigraphic evidence of reversible ischemia or infarct. Calculated left ventricular ejection fraction of 48%. 03/23/2007 Echocardiographic Report: Borderline LVEF 50%, without segmental wall motion abnormalities. Mild mitral regurgitation. Mild to moderate tricuspid regurgitation.  EKG on 02/14/13 showed NSR, RSR1 or QR pattern in V1 suggesting right ventricular conduction delay.  CXR report on 02/14/13 showed no acute cardiopulmonary abnormalities.  Preoperative labs noted.  She will be evaluated by her assigned anesthesiologist on the day of surgery, but if no acute changes then would anticipate she could proceed as planned.  Velna Ochs [facility]Anesthesiology Phone [phone] 02/16/2013 11:08 AM

## 2013-02-19 ENCOUNTER — Other Ambulatory Visit: Payer: Self-pay | Admitting: Gastroenterology

## 2013-02-19 DIAGNOSIS — R079 Chest pain, unspecified: Secondary | ICD-10-CM

## 2013-02-19 DIAGNOSIS — K219 Gastro-esophageal reflux disease without esophagitis: Secondary | ICD-10-CM

## 2013-02-21 MED ORDER — DEXTROSE 5 % IV SOLN
3.0000 g | INTRAVENOUS | Status: AC
  Administered 2013-02-22: 3 g via INTRAVENOUS
  Filled 2013-02-21: qty 3000

## 2013-02-22 ENCOUNTER — Encounter (HOSPITAL_COMMUNITY): Payer: Self-pay | Admitting: Vascular Surgery

## 2013-02-22 ENCOUNTER — Ambulatory Visit (HOSPITAL_COMMUNITY)
Admission: RE | Admit: 2013-02-22 | Discharge: 2013-02-22 | Disposition: A | Discharge status: Home / Self Care | Payer: BC Managed Care – PPO | Source: Ambulatory Visit | Attending: Orthopedic Surgery | Admitting: Orthopedic Surgery

## 2013-02-22 ENCOUNTER — Encounter (HOSPITAL_COMMUNITY)
Admission: RE | Disposition: A | Discharge status: Home / Self Care | Payer: Self-pay | Source: Ambulatory Visit | Attending: Orthopedic Surgery

## 2013-02-22 ENCOUNTER — Ambulatory Visit (HOSPITAL_COMMUNITY): Payer: BC Managed Care – PPO | Admitting: Anesthesiology

## 2013-02-22 ENCOUNTER — Encounter (HOSPITAL_COMMUNITY): Payer: Self-pay | Admitting: *Deleted

## 2013-02-22 DIAGNOSIS — M24119 Other articular cartilage disorders, unspecified shoulder: Secondary | ICD-10-CM | POA: Insufficient documentation

## 2013-02-22 DIAGNOSIS — M67919 Unspecified disorder of synovium and tendon, unspecified shoulder: Secondary | ICD-10-CM | POA: Insufficient documentation

## 2013-02-22 DIAGNOSIS — Z01818 Encounter for other preprocedural examination: Secondary | ICD-10-CM | POA: Insufficient documentation

## 2013-02-22 DIAGNOSIS — M19019 Primary osteoarthritis, unspecified shoulder: Secondary | ICD-10-CM | POA: Insufficient documentation

## 2013-02-22 DIAGNOSIS — M25819 Other specified joint disorders, unspecified shoulder: Secondary | ICD-10-CM | POA: Insufficient documentation

## 2013-02-22 DIAGNOSIS — M7500 Adhesive capsulitis of unspecified shoulder: Secondary | ICD-10-CM | POA: Insufficient documentation

## 2013-02-22 DIAGNOSIS — Z01812 Encounter for preprocedural laboratory examination: Secondary | ICD-10-CM | POA: Insufficient documentation

## 2013-02-22 DIAGNOSIS — Z01810 Encounter for preprocedural cardiovascular examination: Secondary | ICD-10-CM | POA: Insufficient documentation

## 2013-02-22 DIAGNOSIS — M719 Bursopathy, unspecified: Secondary | ICD-10-CM | POA: Insufficient documentation

## 2013-02-22 HISTORY — PX: SHOULDER ARTHROSCOPY WITH ROTATOR CUFF REPAIR AND SUBACROMIAL DECOMPRESSION: SHX5686

## 2013-02-22 SURGERY — SHOULDER ARTHROSCOPY WITH ROTATOR CUFF REPAIR AND SUBACROMIAL DECOMPRESSION
Anesthesia: General | Site: Shoulder | Laterality: Left | Wound class: Clean

## 2013-02-22 MED ORDER — FENTANYL CITRATE 0.05 MG/ML IJ SOLN
INTRAMUSCULAR | Status: AC
  Administered 2013-02-22: 100 ug
  Filled 2013-02-22: qty 2

## 2013-02-22 MED ORDER — METOCLOPRAMIDE HCL 5 MG/ML IJ SOLN
INTRAMUSCULAR | Status: DC | PRN
  Administered 2013-02-22: 10 mg via INTRAVENOUS

## 2013-02-22 MED ORDER — NEOSTIGMINE METHYLSULFATE 1 MG/ML IJ SOLN
INTRAMUSCULAR | Status: DC | PRN
  Administered 2013-02-22: 2.5 mg via INTRAVENOUS

## 2013-02-22 MED ORDER — HYDROMORPHONE HCL PF 1 MG/ML IJ SOLN
0.2500 mg | INTRAMUSCULAR | Status: DC | PRN
  Administered 2013-02-22 (×2): 0.5 mg via INTRAVENOUS

## 2013-02-22 MED ORDER — HYDROMORPHONE HCL PF 1 MG/ML IJ SOLN
INTRAMUSCULAR | Status: AC
  Filled 2013-02-22: qty 1

## 2013-02-22 MED ORDER — SODIUM CHLORIDE 0.9 % IR SOLN
Status: DC | PRN
  Administered 2013-02-22: 6000 mL

## 2013-02-22 MED ORDER — METOCLOPRAMIDE HCL 5 MG/ML IJ SOLN: 10.0000 mg | Freq: Once | INTRAMUSCULAR | Status: DC | PRN

## 2013-02-22 MED ORDER — ONDANSETRON HCL 4 MG/2ML IJ SOLN
INTRAMUSCULAR | Status: DC | PRN
  Administered 2013-02-22: 4 mg via INTRAVENOUS

## 2013-02-22 MED ORDER — BUPIVACAINE-EPINEPHRINE PF 0.5-1:200000 % IJ SOLN
INTRAMUSCULAR | Status: DC | PRN
  Administered 2013-02-22: 18 mL

## 2013-02-22 MED ORDER — CHLORHEXIDINE GLUCONATE 4 % EX LIQD: 60.0000 mL | Freq: Once | CUTANEOUS | Status: DC

## 2013-02-22 MED ORDER — ROCURONIUM BROMIDE 100 MG/10ML IV SOLN
INTRAVENOUS | Status: DC | PRN
  Administered 2013-02-22: 25 mg via INTRAVENOUS

## 2013-02-22 MED ORDER — MIDAZOLAM HCL 2 MG/2ML IJ SOLN: 1.0000 mg | INTRAMUSCULAR | Status: DC | PRN

## 2013-02-22 MED ORDER — LIDOCAINE HCL (CARDIAC) 20 MG/ML IV SOLN
INTRAVENOUS | Status: DC | PRN
  Administered 2013-02-22: 40 mg via INTRAVENOUS

## 2013-02-22 MED ORDER — OXYCODONE HCL 5 MG PO TABS
5.0000 mg | ORAL_TABLET | Freq: Once | ORAL | Status: AC | PRN
  Administered 2013-02-22: 5 mg via ORAL

## 2013-02-22 MED ORDER — OXYCODONE HCL 5 MG PO TABS
ORAL_TABLET | ORAL | Status: AC
  Filled 2013-02-22: qty 1

## 2013-02-22 MED ORDER — PROPOFOL 10 MG/ML IV BOLUS
INTRAVENOUS | Status: DC | PRN
  Administered 2013-02-22: 200 mg via INTRAVENOUS

## 2013-02-22 MED ORDER — FENTANYL CITRATE 0.05 MG/ML IJ SOLN
INTRAMUSCULAR | Status: DC | PRN
  Administered 2013-02-22 (×3): 50 ug via INTRAVENOUS

## 2013-02-22 MED ORDER — FENTANYL CITRATE 0.05 MG/ML IJ SOLN: 50.0000 ug | INTRAMUSCULAR | Status: DC | PRN

## 2013-02-22 MED ORDER — LACTATED RINGERS IV SOLN
INTRAVENOUS | Status: DC
  Administered 2013-02-22 (×2): via INTRAVENOUS

## 2013-02-22 MED ORDER — OXYCODONE HCL 5 MG/5ML PO SOLN: 5.0000 mg | Freq: Once | ORAL | Status: AC | PRN

## 2013-02-22 MED ORDER — MIDAZOLAM HCL 2 MG/2ML IJ SOLN
INTRAMUSCULAR | Status: AC
  Administered 2013-02-22: 2 mg
  Filled 2013-02-22: qty 2

## 2013-02-22 MED ORDER — GLYCOPYRROLATE 0.2 MG/ML IJ SOLN
INTRAMUSCULAR | Status: DC | PRN
  Administered 2013-02-22: .5 mg via INTRAVENOUS

## 2013-02-22 MED ORDER — DEXAMETHASONE SODIUM PHOSPHATE 4 MG/ML IJ SOLN
INTRAMUSCULAR | Status: DC | PRN
  Administered 2013-02-22: 8 mg via INTRAVENOUS

## 2013-02-22 SURGICAL SUPPLY — 60 items
ANCHOR PEEK 4.75X19.1 SWLK C (Anchor) ×4 IMPLANT
ANCHOR PEEK CORKSCREW 4.5 (Anchor) ×2 IMPLANT
BLADE CUTTER GATOR 3.5 (BLADE) ×2 IMPLANT
BLADE GREAT WHITE 4.2 (BLADE) ×2 IMPLANT
BLADE SURG 11 STRL SS (BLADE) ×2 IMPLANT
BOOTCOVER CLEANROOM LRG (PROTECTIVE WEAR) ×4 IMPLANT
BUR 3.5 LG SPHERICAL (BURR) IMPLANT
BUR OVAL 4.0 (BURR) ×2 IMPLANT
BURR 3.5 LG SPHERICAL (BURR)
CANISTER SUCT LVC 12 LTR MEDI- (MISCELLANEOUS) ×2 IMPLANT
CANNULA ACUFLEX KIT 5X76 (CANNULA) ×2 IMPLANT
CANNULA DRILOCK 5.0X75 (CANNULA) IMPLANT
CLOSURE STERI-STRIP 1/4X4 (GAUZE/BANDAGES/DRESSINGS) ×2 IMPLANT
CLOTH BEACON ORANGE TIMEOUT ST (SAFETY) ×2 IMPLANT
CONNECTOR 5 IN 1 STRAIGHT STRL (MISCELLANEOUS) ×2 IMPLANT
DRAPE INCISE 23X17 IOBAN STRL (DRAPES) ×1
DRAPE INCISE IOBAN 23X17 STRL (DRAPES) ×1 IMPLANT
DRAPE INCISE IOBAN 66X45 STRL (DRAPES) ×2 IMPLANT
DRAPE STERI 35X30 U-POUCH (DRAPES) ×2 IMPLANT
DRAPE SURG 17X11 SM STRL (DRAPES) ×2 IMPLANT
DRAPE U-SHAPE 47X51 STRL (DRAPES) ×2 IMPLANT
DRSG PAD ABDOMINAL 8X10 ST (GAUZE/BANDAGES/DRESSINGS) ×2 IMPLANT
DURAPREP 26ML APPLICATOR (WOUND CARE) ×4 IMPLANT
ELECT REM PT RETURN 9FT ADLT (ELECTROSURGICAL) ×2
ELECTRODE REM PT RTRN 9FT ADLT (ELECTROSURGICAL) ×1 IMPLANT
GAUZE XEROFORM 1X8 LF (GAUZE/BANDAGES/DRESSINGS) ×2 IMPLANT
GLOVE BIO SURGEON STRL SZ7.5 (GLOVE) ×2 IMPLANT
GLOVE BIO SURGEON STRL SZ8 (GLOVE) ×2 IMPLANT
GLOVE EUDERMIC 7 POWDERFREE (GLOVE) ×2 IMPLANT
GLOVE SS BIOGEL STRL SZ 7.5 (GLOVE) ×1 IMPLANT
GLOVE SUPERSENSE BIOGEL SZ 7.5 (GLOVE) ×1
GOWN STRL NON-REIN LRG LVL3 (GOWN DISPOSABLE) ×2 IMPLANT
GOWN STRL REIN XL XLG (GOWN DISPOSABLE) ×8 IMPLANT
KIT BASIN OR (CUSTOM PROCEDURE TRAY) ×2 IMPLANT
KIT ROOM TURNOVER OR (KITS) ×2 IMPLANT
KIT SHOULDER TRACTION (DRAPES) ×2 IMPLANT
MANIFOLD NEPTUNE II (INSTRUMENTS) ×2 IMPLANT
NDL SUT 6 .5 CRC .975X.05 MAYO (NEEDLE) IMPLANT
NEEDLE MAYO TAPER (NEEDLE)
NEEDLE SPNL 18GX3.5 QUINCKE PK (NEEDLE) ×2 IMPLANT
NS IRRIG 1000ML POUR BTL (IV SOLUTION) ×2 IMPLANT
PACK SHOULDER (CUSTOM PROCEDURE TRAY) ×2 IMPLANT
PAD ARMBOARD 7.5X6 YLW CONV (MISCELLANEOUS) ×4 IMPLANT
SET ARTHROSCOPY TUBING (MISCELLANEOUS) ×1
SET ARTHROSCOPY TUBING LN (MISCELLANEOUS) ×1 IMPLANT
SLING ARM FOAM STRAP LRG (SOFTGOODS) IMPLANT
SLING ARM FOAM STRAP MED (SOFTGOODS) ×2 IMPLANT
SPONGE GAUZE 4X4 12PLY (GAUZE/BANDAGES/DRESSINGS) ×2 IMPLANT
SPONGE LAP 4X18 X RAY DECT (DISPOSABLE) ×2 IMPLANT
STRIP CLOSURE SKIN 1/2X4 (GAUZE/BANDAGES/DRESSINGS) ×2 IMPLANT
SUT MNCRL AB 3-0 PS2 18 (SUTURE) ×2 IMPLANT
SUT PDS AB 0 CT 36 (SUTURE) IMPLANT
SUT RETRIEVER GRASP 30 DEG (SUTURE) IMPLANT
SYR 20CC LL (SYRINGE) ×2 IMPLANT
TAPE CLOTH 4X10 WHT NS (GAUZE/BANDAGES/DRESSINGS) ×2 IMPLANT
TAPE PAPER 3X10 WHT MICROPORE (GAUZE/BANDAGES/DRESSINGS) ×2 IMPLANT
TOWEL OR 17X24 6PK STRL BLUE (TOWEL DISPOSABLE) ×2 IMPLANT
TOWEL OR 17X26 10 PK STRL BLUE (TOWEL DISPOSABLE) ×2 IMPLANT
WAND SUCTION MAX 4MM 90S (SURGICAL WAND) ×2 IMPLANT
WATER STERILE IRR 1000ML POUR (IV SOLUTION) ×2 IMPLANT

## 2013-02-22 NOTE — Anesthesia Preprocedure Evaluation (Signed)
Anesthesia Evaluation  Patient identified by MRN, date of birth, ID band Patient awake    Reviewed: Allergy & Precautions, H&P , NPO status , Patient's Chart, lab work & pertinent test results, reviewed documented beta blocker date and time   Airway Mallampati: II TM Distance: >3 FB Neck ROM: full    Dental   Pulmonary neg pulmonary ROS, sleep apnea ,  breath sounds clear to auscultation        Cardiovascular negative cardio ROS  Rhythm:regular     Neuro/Psych  Neuromuscular disease negative psych ROS   GI/Hepatic negative GI ROS, Neg liver ROS,   Endo/Other  negative endocrine ROS  Renal/GU negative Renal ROS  negative genitourinary   Musculoskeletal   Abdominal   Peds  Hematology negative hematology ROS (+)   Anesthesia Other Findings See surgeon's H&P   Reproductive/Obstetrics negative OB ROS                           Anesthesia Physical Anesthesia Plan  ASA: II  Anesthesia Plan: General   Post-op Pain Management:    Induction: Intravenous  Airway Management Planned: Oral ETT  Additional Equipment:   Intra-op Plan:   Post-operative Plan: Extubation in OR  Informed Consent: I have reviewed the patients History and Physical, chart, labs and discussed the procedure including the risks, benefits and alternatives for the proposed anesthesia with the patient or authorized representative who has indicated his/her understanding and acceptance.   Dental Advisory Given  Plan Discussed with: CRNA and Surgeon  Anesthesia Plan Comments:         Anesthesia Quick Evaluation

## 2013-02-22 NOTE — Transfer of Care (Signed)
Immediate Anesthesia Transfer of Care Note  Patient: Dana Harding  Procedure(s) Performed: Procedure(s): LEFT SHOULDER ARTHROSCOPY WITH SUBACROMIAL DECOMPRESSION DISTAL CLAVICLE RESECTION with ROTATOR CUFF REPAIR   (Left)  Patient Location: PACU  Anesthesia Type:General  Level of Consciousness: awake, alert  and oriented  Airway & Oxygen Therapy: Patient Spontanous Breathing and Patient connected to nasal cannula oxygen  Post-op Assessment: Report given to PACU RN, Post -op Vital signs reviewed and stable and Patient moving all extremities  Post vital signs: Reviewed and stable  Complications: No apparent anesthesia complications

## 2013-02-22 NOTE — H&P (Signed)
Page Spiro    Chief Complaint: left shoulder chronic impingement HPI: The patient is a 62 y.o. female with chronic left shoulder impingement refractory to conservative Rx  Past Medical History  Diagnosis Date  . Obesity   . IBS (irritable bowel syndrome)   . Anxiety   . Facet hypertrophy of lumbar region 02/09/07    bilateral articular L4-5; per MRI  . Lumbar foraminal stenosis 02/09/07    mild; L4-5; per MRI  . Mitral regurgitation 03/23/07    mild  . Tricuspid regurgitation 03/23/07    mild to moderate  . Obstructive sleep apnea (adult) (pediatric)     CPAP  . Fitting and adjustment of gastric lap band   . Dizziness and giddiness     intermittent  . Family history of malignant neoplasm of breast   . Inflammatory arthritis     H/o elevated ANA, treated with plaquenil w/o sig change in  back pain  . Esophageal spasm     Past Surgical History  Procedure Laterality Date  . Cholecystectomy  1980's  . Tonsillectomy and adenoidectomy  1960  . Tummy tuck  1996  . Laparoscopic gastric banding  2007  . Breast reduction surgery  2009  . Inguinal hernia repair      bilateral  . Laparoscopic gastric banding  2008    Realize    Family History  Problem Relation Age of Onset  . Breast cancer Mother     < 50  . Stroke Father     < 18    Social History:  reports that she quit smoking about 34 years ago. She has never used smokeless tobacco. She reports that  drinks alcohol. She reports that she does not use illicit drugs.  Allergies:  Allergies  Allergen Reactions  . Latex Itching    Medications Prior to Admission  Medication Sig Dispense Refill  . ALPRAZolam (XANAX) 0.5 MG tablet Take 0.5 mg by mouth daily as needed for anxiety. 1 by mouth daily as needed      . B Complex Vitamins (VITAMIN B COMPLEX PO) Take 1 tablet by mouth daily.      Marland Kitchen BIOTIN PO Take 1 tablet by mouth daily.      . clidinium-chlordiazePOXIDE (LIBRAX) 2.5-5 MG per capsule Take 1 capsule by mouth 3  (three) times daily as needed (irritable bowel syndrome).      Marland Kitchen co-enzyme Q-10 30 MG capsule Take 30 mg by mouth daily.        . Eflornithine HCl (VANIQA) 13.9 % cream Apply a small amount BID  45 g  5  . Folic Acid POWD Take 400 mcg by mouth daily.       Marland Kitchen HYDROcodone-acetaminophen (NORCO/VICODIN) 5-325 MG per tablet Take 1 tablet by mouth every 4 (four) hours as needed for pain.      . methocarbamol (ROBAXIN) 500 MG tablet Take 500 mg by mouth 3 (three) times daily as needed (muscle relaxer.).      Marland Kitchen vitamin C (ASCORBIC ACID) 500 MG tablet Take 500 mg by mouth daily.       Marland Kitchen aspirin 81 MG tablet Take 81 mg by mouth daily.        . cholecalciferol (VITAMIN D) 1000 UNITS tablet Take 1,000 Units by mouth daily.        Marland Kitchen lidocaine (LIDODERM) 5 % Place 1 patch onto the skin daily as needed (pain). Remove & Discard patch within 12 hours or as directed by MD as needed  Physical Exam: left shoulder with painful and restricted motion as noted at recent office visit  Vitals  Temp:  [98.5 F (36.9 C)] 98.5 F (36.9 C) (03/20 0828) Pulse Rate:  [66-74] 74 (03/20 0915) Resp:  [18] 18 (03/20 0915) BP: (100-105)/(70-71) 100/70 mmHg (03/20 0915) SpO2:  [100 %] 100 % (03/20 0915)  Assessment/Plan  Impression: left shoulder chronic impingement  Plan of Action: Procedure(s): LEFT SHOULDER ARTHROSCOPY WITH SUBACROMIAL DECOMPRESSION DISTAL CLAVICLE RESECTION POSSIBLE ROTATOR CUFF REPAIR    Amaiya Scruton M 02/22/2013, 9:26 AM

## 2013-02-22 NOTE — Anesthesia Postprocedure Evaluation (Signed)
  Anesthesia Post-op Note  Patient: Dana Harding  Procedure(s) Performed: Procedure(s): LEFT SHOULDER ARTHROSCOPY WITH SUBACROMIAL DECOMPRESSION DISTAL CLAVICLE RESECTION with ROTATOR CUFF REPAIR   (Left)  Patient Location: PACU  Anesthesia Type:General and GA combined with regional for post-op pain  Level of Consciousness: awake  Airway and Oxygen Therapy: Patient Spontanous Breathing  Post-op Pain: mild  Post-op Assessment: Post-op Vital signs reviewed  Post-op Vital Signs: stable  Complications: No apparent anesthesia complications

## 2013-02-22 NOTE — Preoperative (Signed)
Beta Blockers   Reason not to administer Beta Blockers:Not Applicable 

## 2013-02-22 NOTE — Anesthesia Procedure Notes (Addendum)
Anesthesia Regional Block:  Interscalene brachial plexus block  Pre-Anesthetic Checklist: ,, timeout performed, Correct Patient, Correct Site, Correct Laterality, Correct Procedure, Correct Position, site marked, Risks and benefits discussed,  Surgical consent,  Pre-op evaluation,  At surgeon's request and post-op pain management  Laterality: Left  Prep: chloraprep       Needles:   Needle Type: Other     Needle Length: 9cm  Needle Gauge: 21    Additional Needles:  Procedures: ultrasound guided (picture in chart) Interscalene brachial plexus block Narrative:  Start time: 02/22/2013 9:18 AM End time: 02/22/2013 9:25 AM Injection made incrementally with aspirations every 5 mL.  Performed by: Personally  Anesthesiologist: Aldona Lento, MD  Additional Notes: Ultrasound guidance used to: id relevant anatomy, confirm needle position, local anesthetic spread, avoidance of vascular puncture. Picture saved. No complications. Block performed personally by Janetta Hora. Gelene Mink, MD    Interscalene brachial plexus block Procedure Name: Intubation Date/Time: 02/22/2013 10:20 AM Performed by: Sharlene Dory E Pre-anesthesia Checklist: Patient identified, Emergency Drugs available, Suction available, Patient being monitored and Timeout performed Patient Re-evaluated:Patient Re-evaluated prior to inductionOxygen Delivery Method: Circle system utilized Preoxygenation: Pre-oxygenation with 100% oxygen Intubation Type: IV induction Ventilation: Mask ventilation without difficulty Laryngoscope Size: Mac and 3 Grade View: Grade I Tube type: Oral Tube size: 7.0 mm Number of attempts: 1 Airway Equipment and Method: Stylet Placement Confirmation: ETT inserted through vocal cords under direct vision,  positive ETCO2 and breath sounds checked- equal and bilateral Secured at: 21 cm Tube secured with: Tape Dental Injury: Teeth and Oropharynx as per pre-operative assessment

## 2013-02-22 NOTE — Op Note (Signed)
02/22/2013  11:39 AM  PATIENT:   Dana Harding  62 y.o. female  PRE-OPERATIVE DIAGNOSIS:  left shoulder chronic impingement  POST-OPERATIVE DIAGNOSIS:  Same with labral tear, rotator cuff tear, and ac joint OA  PROCEDURE:  LSA, labral debridement, SAD, DCR, RCR  SURGEON:  Aiken Withem, Vania Rea M.D.  ASSISTANTS: Shuford pac   ANESTHESIA:   GET + ISB  EBL: min  SPECIMEN:  none  Drains: noine   PATIENT DISPOSITION:  PACU - hemodynamically stable.    PLAN OF CARE: Discharge to home after PACU  Dictation# [id number]

## 2013-02-22 NOTE — Progress Notes (Signed)
Have not received any records from cardiovascular [facility].

## 2013-02-23 ENCOUNTER — Encounter (HOSPITAL_COMMUNITY): Payer: Self-pay | Admitting: Orthopedic Surgery

## 2013-02-23 NOTE — Op Note (Signed)
Dana Harding, Dana Harding                ACCOUNT NO.:  [id number]  MEDICAL RECORD NO.:  [id number]  LOCATION:  [facility]                         FACILITY:  [facility]  PHYSICIAN:  Vania Rea. Teauna Dubach, M.D.  DATE OF BIRTH:  06-01-1951  DATE OF PROCEDURE:  02/22/2013 DATE OF DISCHARGE:  02/22/2013                              OPERATIVE REPORT   PREOPERATIVE DIAGNOSES: 1. Chronic left shoulder pain with impingement syndrome and possible     full-thickness rotator cuff tear. 2. Left shoulder acromioclavicular joint arthropathy.  POSTOPERATIVE DIAGNOSES: 1. Chronic left shoulder impingement syndrome. 2. Left shoulder rotator cuff tear. 3. Left shoulder labral tear. 4. Left shoulder acromioclavicular joint arthrosis. 5. Mild adhesive capsulitis.  PROCEDURE: 1. Left shoulder examination under anesthesia. 2. Left shoulder manipulation under anesthesia. 3. Left shoulder diagnostic arthroscopy. 4. Debridement of degenerative labral tear. 5. Arthroscopic subacromial decompression bursectomy. 6. Arthroscopic distal clavicle resection. 7. Arthroscopic rotator cuff repair using a double row suture bridge     repair construct.  SURGEON:  Vania Rea. Jaylissa Felty, M.D.  Threasa HeadsFrench Ana A. Shuford, PA-C.  ANESTHESIA:  General endotracheal as well as an interscalene block.  ESTIMATED BLOOD LOSS:  Minimal.  DRAINS:  None.  HISTORY:  Dana Harding is a 62 year old female who has had chronic and progressive increasing left shoulder pain with symptoms that have been refractory to prolonged attempts at conservative management.  Due to her ongoing pain and functional limitation, she is brought to the operating room at this time for planned left shoulder arthroscopy as described below.  We counseled Ms. Gilliand on treatment options as well as risks versus benefits thereof.  Possible surgical complications were reviewed including potential for bleeding, infection, neurovascular injury, persistent pain, loss of motion,  anesthetic complication, recurrence of rotator cuff tear, and possible need for additional surgery.  She understands and accepts and agrees with our planned procedure.  PROCEDURE IN DETAIL:  After undergoing routine preop evaluation, the patient received prophylactic antibiotics and an interscalene block was established in the holding room by the Anesthesia Department.  Placed supine on the operating table, underwent smooth induction of general endotracheal anesthesia.  Turned to the right lateral decubitus position on a beanbag and appropriately padded and protected.  Left shoulder examination under anesthesia showed some mild restriction in mobility approximately 120 degrees forward elevation, gentle manipulation was performed with palpable and audible release of adhesions to 180 degrees of forward elevation, 90 degrees of rotation.  Left arm was then suspended at 70 degrees of abduction with 10 pounds of traction and left shoulder girdle region was sterilely prepped and draped in standard fashion.  Time-out was called.  Posterior portal sites __________ the anterior portal sites under direct visualization.  The articular surfaces of the glenohumeral joint were in good condition.  There was diffuse synovitis noted and we did perform a limited synovectomy and obtained hemostasis with a Stryker wand.  There was also degenerative tearing of the superior labrum, which was debrided with a shaver to a stable margin.  The rotator cuff was inspected and found to have an area of attenuation and at least partial articular sided tearing noted involving the distal supraspinatus and we did pass  the tag suture of 0- PDS at this point.  We then completed the synovectomy and obtained hemostasis in the glenohumeral joint.  Additional inspection showed no further pathologies in the joint.  Fluid and instruments were then removed.  The arm was dropped down to 30 degrees abduction with the arthroscope in  the subacromial space __________ subacromial space. Abundant dense bursal tissue and multiple adhesions were encountered and these were divided and excised with a combination of shaver and Stryker wand.  Wand was then used to remove the periosteum from the undersurface of the anterior half of the acromion and then a subacromial depression was performed with a bur creating type 1 morphology.  Of note, there was a very prominent anterior-inferior acromial "hook," which was nicely decompressed.  I then established a portal directly anterior to the distal clavicle and a distal clavicle resection was performed with a bur.  Care was taken to confirm visualization of the entire circumference of the distal clavicle to ensure adequate removal of bone. We then completed the subacromial/subdeltoid bursectomy.  The bursal surface of the rotator cuff was inspected and it indeed find an area of full-thickness defect at the level of the tag suture and the tag suture was removed.  We went ahead and debrided the rotator cuff.  __________ tuberosity at the site of the area of the rotator cuff tear and __________ a defect of approximately 2 cm in width.  The free margin of rotator cuff was debrided back to healthy tissue with a shaver.  We then prepared the tuberosity, we gently abraded the bone to bleeding bed. Accessory portal device was established and through a __________ lateral margin of the acromion, we placed an Arthrex PEEK 4.5 corkscrew suture anchor.  The limbs of the suture anchor were then shuttled across __________ margin of the rotator cuff tear __________ followed by multiple overhand throws and alternating posts.  We then created "suture bridge" with 2 swivel lock suture anchors compressing the free margin of the rotator cuff __________ tuberosity and the overall construct was much to our satisfaction.  Suture limbs were all clipped.  Final inspection and irrigation was completed.  Fluid and  instruments was removed.  Ralene Bathe, PA-C was used as Geophysicist/field seismologist throughout this case, essential for help with positioning the extremity, management with arthroscopic equipment, tissue manipulation, suture management, wound closure, and intraoperative decision making.  The wound was closed with Monocryl and Steri-Strips.  A dry dressing taped to the left shoulder. Left arm was placed in a sling.  The patient was awakened, extubated, and taken to recovery room in stable condition.    Vania Rea. Reace Breshears, M.D.    KMS/MEDQ  D:  02/22/2013  T:  02/23/2013  Job:  [id number]

## 2013-05-01 ENCOUNTER — Other Ambulatory Visit: Payer: Self-pay | Admitting: Family Medicine

## 2013-05-15 ENCOUNTER — Encounter: Payer: Self-pay | Admitting: Family Medicine

## 2013-05-16 ENCOUNTER — Encounter: Payer: Self-pay | Admitting: Family Medicine

## 2013-08-27 ENCOUNTER — Other Ambulatory Visit: Payer: Self-pay | Admitting: Family Medicine

## 2013-08-27 NOTE — Telephone Encounter (Signed)
Electronic refill request.  Please advise. 

## 2013-08-27 NOTE — Telephone Encounter (Signed)
Sent!

## 2013-10-11 ENCOUNTER — Other Ambulatory Visit: Payer: Self-pay

## 2014-01-02 ENCOUNTER — Other Ambulatory Visit: Payer: Self-pay | Admitting: Family Medicine

## 2014-01-02 NOTE — Telephone Encounter (Signed)
Please schedule her for a CPE then send as is.  Thanks.

## 2014-01-02 NOTE — Telephone Encounter (Signed)
Received refill request electronically. Last office visit 10/16/12,, last refill message sent that patient needed office visit for further refills. No appointments scheduled.

## 2014-01-03 MED ORDER — CILIDINIUM-CHLORDIAZEPOXIDE 2.5-5 MG PO CAPS: ORAL_CAPSULE | ORAL | Status: DC

## 2014-01-03 NOTE — Addendum Note (Signed)
Addended by: Josetta Huddle on: 01/03/2014 10:52 AM   Modules accepted: Orders

## 2014-01-03 NOTE — Telephone Encounter (Signed)
Medication phoned to pharmacy.  

## 2014-01-03 NOTE — Telephone Encounter (Signed)
VM from [place] That [facility] required hand written rx. Spoke with Santiago Glad at [facility] and she said did not require written rx and in process of preparing med for pt; nothing further needed.

## 2014-01-25 DIAGNOSIS — L720 Epidermal cyst: Secondary | ICD-10-CM | POA: Insufficient documentation

## 2014-02-08 ENCOUNTER — Other Ambulatory Visit: Payer: BC Managed Care – PPO

## 2014-02-22 ENCOUNTER — Other Ambulatory Visit: Payer: BC Managed Care – PPO

## 2014-03-01 ENCOUNTER — Encounter: Payer: BC Managed Care – PPO | Admitting: Family Medicine

## 2014-03-11 ENCOUNTER — Other Ambulatory Visit: Payer: Self-pay | Admitting: Family Medicine

## 2014-03-11 DIAGNOSIS — Z823 Family history of stroke: Secondary | ICD-10-CM

## 2014-03-19 ENCOUNTER — Other Ambulatory Visit (INDEPENDENT_AMBULATORY_CARE_PROVIDER_SITE_OTHER): Payer: BC Managed Care – PPO

## 2014-03-19 DIAGNOSIS — Z823 Family history of stroke: Secondary | ICD-10-CM

## 2014-03-19 LAB — LIPID PANEL
CHOL/HDL RATIO: 3
CHOLESTEROL: 212 mg/dL — AB (ref 0–200)
HDL: 76.6 mg/dL (ref >39.00)
LDL CALC: 123 mg/dL — AB (ref 0–99)
TRIGLYCERIDES: 64 mg/dL (ref 0.0–149.0)
VLDL: 12.8 mg/dL (ref 0.0–40.0)

## 2014-03-19 LAB — COMPREHENSIVE METABOLIC PANEL
ALK PHOS: 72 U/L (ref 39–117)
ALT: 14 U/L (ref 0–35)
AST: 22 U/L (ref 0–37)
Albumin: 4 g/dL (ref 3.5–5.2)
BUN: 13 mg/dL (ref 6–23)
CO2: 28 mEq/L (ref 19–32)
CREATININE: 0.9 mg/dL (ref 0.4–1.2)
Calcium: 9.4 mg/dL (ref 8.4–10.5)
Chloride: 106 mEq/L (ref 96–112)
GFR: 86.17 mL/min (ref >60.00)
GLUCOSE: 94 mg/dL (ref 70–99)
Potassium: 4 mEq/L (ref 3.5–5.1)
Sodium: 141 mEq/L (ref 135–145)
Total Bilirubin: 0.7 mg/dL (ref 0.3–1.2)
Total Protein: 7.4 g/dL (ref 6.0–8.3)

## 2014-03-26 ENCOUNTER — Ambulatory Visit (INDEPENDENT_AMBULATORY_CARE_PROVIDER_SITE_OTHER): Payer: BC Managed Care – PPO | Admitting: Family Medicine

## 2014-03-26 ENCOUNTER — Other Ambulatory Visit (HOSPITAL_COMMUNITY)
Admission: RE | Admit: 2014-03-26 | Discharge: 2014-03-26 | Disposition: A | Discharge status: Home / Self Care | Payer: BC Managed Care – PPO | Source: Ambulatory Visit | Attending: Family Medicine | Admitting: Family Medicine

## 2014-03-26 ENCOUNTER — Encounter: Payer: Self-pay | Admitting: Family Medicine

## 2014-03-26 VITALS — BP 122/82 | HR 85 | Temp 98.1°F | Ht 68.0 in | Wt 178.5 lb

## 2014-03-26 DIAGNOSIS — M25579 Pain in unspecified ankle and joints of unspecified foot: Secondary | ICD-10-CM | POA: Insufficient documentation

## 2014-03-26 DIAGNOSIS — K589 Irritable bowel syndrome without diarrhea: Secondary | ICD-10-CM

## 2014-03-26 DIAGNOSIS — Z1151 Encounter for screening for human papillomavirus (HPV): Secondary | ICD-10-CM | POA: Insufficient documentation

## 2014-03-26 DIAGNOSIS — Z01419 Encounter for gynecological examination (general) (routine) without abnormal findings: Secondary | ICD-10-CM | POA: Insufficient documentation

## 2014-03-26 DIAGNOSIS — Z23 Encounter for immunization: Secondary | ICD-10-CM

## 2014-03-26 DIAGNOSIS — Z0000 Encounter for general adult medical examination without abnormal findings: Secondary | ICD-10-CM

## 2014-03-26 DIAGNOSIS — Z2911 Encounter for prophylactic immunotherapy for respiratory syncytial virus (RSV): Secondary | ICD-10-CM

## 2014-03-26 MED ORDER — NAPROXEN 500 MG PO TABS: 500.0000 mg | ORAL_TABLET | Freq: Two times a day (BID) | ORAL | Status: DC

## 2014-03-26 NOTE — Progress Notes (Signed)
Pre visit review using our clinic review tool, if applicable. No additional management support is needed unless otherwise documented below in the visit note.  CPE- See plan.  Routine anticipatory guidance given to patient.  See health maintenance. Tetanus 2009 Flu shot prev done.  PNA vaccine 2010.  Shingles shot d/w pt.   Colonoscopy prev done by Dr. Collene Mares, 02/2013 Mammogram 05/2013.  She'll f/u on that.   DXA not due yet.   Diet and exercise d/w pt.  She's working on both.  Intentional weight loss.  Pap d/w pt.  Due.  Living will d/w pt.  Encouraged.  Daughter designated if patient were incapacitated.    Had seen GI prev.  Still with occ esophageal spasm. Usually controlled with meds.  No red flag sx, no vomiting blood.  Requesting records.    R shoulder pain.  Was compensating for the L shoulder. She took some left over antiinflammatories from her shoulder pain.    R ankle pain, had seen ortho prev.  Topical nsaid didn't help.  We discussed that she has been able to tolerate naproxen occ with ADE.  Better in a shoe with a heel.  R sided eye discomfort.  Going on for about 1 week.  Neither better or worse.  No trauma.  No FB.  Vision is at baseline.  No L sided sx.  No discharge.    Doing part time law work.    PMH and SH reviewed  Meds, vitals, and allergies reviewed.   ROS: See HPI.  Otherwise negative.    GEN: nad, alert and oriented HEENT: mucous membranes moist, PERRL, EOMI, fundus and conjunctiva wnl B NECK: supple w/o LA CV: rrr. PULM: ctab, no inc wob ABD: soft, +bs EXT: no edema SKIN: no acute rash Normal introitus for age, no external lesions, no vaginal discharge, mucosa pink and moist, no vaginal or cervical lesions, no vaginal atrophy, no friaility or hemorrhage, normal uterus size and position, no adnexal masses or tenderness.  Chaperoned exam.  Pap collected.  R ankle tender B posteriorly but only lateral to the achilles, not on the achilles itself, ie just  posterior to the med/lat mal B.

## 2014-03-26 NOTE — Assessment & Plan Note (Signed)
Requesting colonoscopy records.  Continue baseline meds.

## 2014-03-26 NOTE — Patient Instructions (Addendum)
Use a heel wedge along with a lace up ankle brace (as needed). Take naproxen sparingly with food. That should help.  Take care. Keep exercising and working on your weight.  We'll request your records from Dr. Collene Mares. Call Dr. Rachael Fee clinic about a follow up appointment.   We'll contact you with your lab report.

## 2014-03-26 NOTE — Assessment & Plan Note (Signed)
Routine anticipatory guidance given to patient.  See health maintenance. Tetanus 2009 Flu shot prev done.  PNA vaccine 2010.  Shingles shot d/w pt.   Colonoscopy prev done by Dr. Collene Mares, 02/2013 Mammogram 05/2013.  She'll f/u on that.   DXA not due yet.   Diet and exercise d/w pt.  She's working on both.  Intentional weight loss.  Pap d/w pt.  Due.  Living will d/w pt.  Encouraged.  Daughter designated if patient were incapacitated.   Benign eye exam.  She'll f/u with eye clinic.

## 2014-03-26 NOTE — Assessment & Plan Note (Signed)
Likely tendonitis, d/w pt about heel lift vs OTC ASO.  Should resolve.

## 2014-03-29 ENCOUNTER — Encounter: Payer: Self-pay | Admitting: *Deleted

## 2014-05-01 ENCOUNTER — Telehealth: Payer: Self-pay

## 2014-05-01 MED ORDER — AZELASTINE HCL 0.1 % NA SOLN: 1.0000 | Freq: Two times a day (BID) | NASAL | Status: DC | PRN

## 2014-05-01 NOTE — Telephone Encounter (Signed)
Pt left v/m requesting rx for Azelastine HCL to [place]. Not on pts current or hx med list. Pt uses Azelastine HCL nasal spray for non allergic rhinitis. Unable to reach pt to see who prescribed med or directions pt has been using.

## 2014-05-01 NOTE — Telephone Encounter (Signed)
Sent!

## 2014-05-17 ENCOUNTER — Encounter: Payer: Self-pay | Admitting: Family Medicine

## 2014-05-20 ENCOUNTER — Encounter: Payer: Self-pay | Admitting: Family Medicine

## 2014-05-22 ENCOUNTER — Telehealth: Payer: Self-pay

## 2014-05-22 DIAGNOSIS — R519 Headache, unspecified: Secondary | ICD-10-CM

## 2014-05-22 DIAGNOSIS — R51 Headache: Principal | ICD-10-CM

## 2014-05-22 NOTE — Telephone Encounter (Signed)
Thanks

## 2014-05-22 NOTE — Telephone Encounter (Signed)
They should be able to give her a slip to get this drawn in [place] and have the results sent directly back to them.  That is preferred.   If they can't do that, then I put in the orders.  Can be done at [facility] or at [facility] if needed.  Thanks.  She would likely only need a CBC and sed rate, not the CRP also.

## 2014-05-22 NOTE — Telephone Encounter (Signed)
Shirleen Schirmer PA with [facility] saw pt today for yearly dilation eye appt; pt complained with rt temporal pain and h/a on and off; Gwenlyn Perking did not know when temporal pain and h/a on rt side had started.  Gwenlyn Perking [place] will fax notes to Dr Damita Dunnings at [phone]. Gwenlyn Perking gave pt a note needing CBC, sedrate and CRP. [facility] is not [facility] and Gwenlyn Perking PA request Dr Damita Dunnings to order CBC, Sed rate and CRP and any other testing Dr Damita Dunnings feels indicated for r/o temporal arthritis. Albert request lab results faxed to his attention at [phone]. Please advise.

## 2014-05-22 NOTE — Telephone Encounter (Signed)
Albert (PA) notified as instructed by telephone. Gwenlyn Perking stated that he was not sure as to how to handle this. Gwenlyn Perking stated that he had given the patient a written script for the lab work and will have his office call and have her get the lab work done at the lab office next to his office. Gwenlyn Perking stated that if the sed rate comes back abnormal he will let Dr. Damita Dunnings know and will refer the patient back to him.

## 2014-05-26 ENCOUNTER — Telehealth: Payer: Self-pay | Admitting: Family Medicine

## 2014-05-26 NOTE — Telephone Encounter (Signed)
Please get update from patient re: labs done via eye clinic and her headaches.  Thanks.

## 2014-05-27 NOTE — Telephone Encounter (Signed)
Left message on voice mail  to call back

## 2014-05-28 NOTE — Telephone Encounter (Signed)
Thanks

## 2014-05-28 NOTE — Telephone Encounter (Signed)
Pt left v/m returning call and requesting cb [phone].

## 2014-05-28 NOTE — Telephone Encounter (Signed)
Patient had lab work done Friday, but has not heard the results and she will make sure that you get a copy.  Patient stated that the pain in right eye has subsided some and she is not having headaches now. Patient stated that she will call if symptoms return.

## 2014-06-06 ENCOUNTER — Telehealth: Payer: Self-pay | Admitting: *Deleted

## 2014-06-06 NOTE — Telephone Encounter (Signed)
Please call back for me.  Pull me if needed.  Labs reviewed. If she is feeling better, with no other sx, then temporal arteritis is not likely with the normal sed rate.  I would advise them to recheck the CRP at the next visit with the eye clinic.  If she has other symptoms (or if the repeat CRP is still up), then I would like to see her back in the clinic here.  If they can't recheck it, then we can get her set for an OV here to do it after I see her.  Thanks.

## 2014-06-06 NOTE — Telephone Encounter (Signed)
Patient was in for routine eye exam and he had some labs drawn to rule out temporal arteritis.   That office faxed the labs over to this office yesterday.  The C-reactive protein was elevated and he wants your input on this.  Mr. Dana Harding will be seeing the patient back in the office in 1-2 weeks.  He can be reached at the number above.

## 2014-06-06 NOTE — Telephone Encounter (Signed)
Theodosia Quay, PA advised.  He says he will repeat the CRP at next OV and proceed as needed.

## 2014-06-25 ENCOUNTER — Encounter: Payer: Self-pay | Admitting: Family Medicine

## 2014-08-28 ENCOUNTER — Telehealth: Payer: Self-pay

## 2014-08-28 MED ORDER — OSELTAMIVIR PHOSPHATE 75 MG PO CAPS: 75.0000 mg | ORAL_CAPSULE | Freq: Every day | ORAL | Status: DC

## 2014-08-28 NOTE — Telephone Encounter (Signed)
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 Pt request rx of tamiflu [facility] [address]. Pt request cb.

## 2014-08-28 NOTE — Telephone Encounter (Signed)
Sent, take once daily, get a flu shot if not already done.   Thanks.

## 2014-08-28 NOTE — Telephone Encounter (Signed)
Patient notified as instructed by telephone. Patient stated that she will call back for an appointment to get the flu shot.

## 2014-11-14 ENCOUNTER — Ambulatory Visit (INDEPENDENT_AMBULATORY_CARE_PROVIDER_SITE_OTHER): Payer: BC Managed Care – PPO

## 2014-11-14 ENCOUNTER — Ambulatory Visit: Payer: BC Managed Care – PPO | Admitting: Family Medicine

## 2014-11-14 DIAGNOSIS — Z23 Encounter for immunization: Secondary | ICD-10-CM

## 2014-11-18 ENCOUNTER — Ambulatory Visit: Payer: BC Managed Care – PPO | Admitting: Family Medicine

## 2015-03-15 ENCOUNTER — Other Ambulatory Visit: Payer: Self-pay | Admitting: Family Medicine

## 2015-03-17 NOTE — Telephone Encounter (Signed)
Electronic refill request.   Patient due for CPE.  Last Filled:   08/27/13   Please advise.

## 2015-03-18 NOTE — Telephone Encounter (Signed)
Left detailed message on voicemail.  

## 2015-03-18 NOTE — Telephone Encounter (Signed)
Pt missed call and called back; pt notified as instructed and pt voiced understanding and will cb for CPX.

## 2015-03-18 NOTE — Telephone Encounter (Signed)
Sent, please schedule CPE.  Thanks.

## 2015-03-25 ENCOUNTER — Other Ambulatory Visit: Payer: Self-pay | Admitting: Family Medicine

## 2015-03-25 DIAGNOSIS — E785 Hyperlipidemia, unspecified: Secondary | ICD-10-CM

## 2015-03-27 ENCOUNTER — Other Ambulatory Visit (INDEPENDENT_AMBULATORY_CARE_PROVIDER_SITE_OTHER): Payer: BLUE CROSS/BLUE SHIELD

## 2015-03-27 DIAGNOSIS — E785 Hyperlipidemia, unspecified: Secondary | ICD-10-CM

## 2015-03-27 DIAGNOSIS — R51 Headache: Secondary | ICD-10-CM

## 2015-03-27 DIAGNOSIS — R519 Headache, unspecified: Secondary | ICD-10-CM

## 2015-03-27 LAB — CBC WITH DIFFERENTIAL/PLATELET
BASOS ABS: 0 10*3/uL (ref 0.0–0.1)
Basophils Relative: 0.6 % (ref 0.0–3.0)
EOS PCT: 5.1 % — AB (ref 0.0–5.0)
Eosinophils Absolute: 0.3 10*3/uL (ref 0.0–0.7)
HCT: 38.4 % (ref 36.0–46.0)
Hemoglobin: 12.8 g/dL (ref 12.0–15.0)
Lymphocytes Relative: 36.8 % (ref 12.0–46.0)
Lymphs Abs: 2.1 10*3/uL (ref 0.7–4.0)
MCHC: 33.3 g/dL (ref 30.0–36.0)
MCV: 81.2 fl (ref 78.0–100.0)
MONOS PCT: 6 % (ref 3.0–12.0)
Monocytes Absolute: 0.3 10*3/uL (ref 0.1–1.0)
Neutro Abs: 3 10*3/uL (ref 1.4–7.7)
Neutrophils Relative %: 51.5 % (ref 43.0–77.0)
PLATELETS: 302 10*3/uL (ref 150.0–400.0)
RBC: 4.73 Mil/uL (ref 3.87–5.11)
RDW: 15.5 % (ref 11.5–15.5)
WBC: 5.7 10*3/uL (ref 4.0–10.5)

## 2015-03-27 LAB — COMPREHENSIVE METABOLIC PANEL
ALBUMIN: 4.1 g/dL (ref 3.5–5.2)
ALT: 13 U/L (ref 0–35)
AST: 19 U/L (ref 0–37)
Alkaline Phosphatase: 91 U/L (ref 39–117)
BUN: 13 mg/dL (ref 6–23)
CHLORIDE: 107 meq/L (ref 96–112)
CO2: 29 meq/L (ref 19–32)
Calcium: 9.5 mg/dL (ref 8.4–10.5)
Creatinine, Ser: 0.99 mg/dL (ref 0.40–1.20)
GFR: 73.01 mL/min (ref >60.00)
GLUCOSE: 99 mg/dL (ref 70–99)
POTASSIUM: 4.5 meq/L (ref 3.5–5.1)
SODIUM: 141 meq/L (ref 135–145)
TOTAL PROTEIN: 7 g/dL (ref 6.0–8.3)
Total Bilirubin: 0.5 mg/dL (ref 0.2–1.2)

## 2015-03-27 LAB — LIPID PANEL
CHOLESTEROL: 216 mg/dL — AB (ref 0–200)
HDL: 63.8 mg/dL (ref >39.00)
LDL CALC: 138 mg/dL — AB (ref 0–99)
NonHDL: 152.2
Total CHOL/HDL Ratio: 3
Triglycerides: 69 mg/dL (ref 0.0–149.0)
VLDL: 13.8 mg/dL (ref 0.0–40.0)

## 2015-03-27 LAB — SEDIMENTATION RATE: Sed Rate: 11 mm/hr (ref 0–22)

## 2015-04-04 ENCOUNTER — Encounter: Payer: Self-pay | Admitting: Family Medicine

## 2015-04-04 ENCOUNTER — Ambulatory Visit (INDEPENDENT_AMBULATORY_CARE_PROVIDER_SITE_OTHER): Payer: BLUE CROSS/BLUE SHIELD | Admitting: Family Medicine

## 2015-04-04 VITALS — BP 118/78 | HR 82 | Temp 98.5°F | Ht 68.0 in | Wt 184.0 lb

## 2015-04-04 DIAGNOSIS — Z0000 Encounter for general adult medical examination without abnormal findings: Secondary | ICD-10-CM

## 2015-04-04 DIAGNOSIS — Z7189 Other specified counseling: Secondary | ICD-10-CM | POA: Insufficient documentation

## 2015-04-04 NOTE — Assessment & Plan Note (Signed)
Routine anticipatory guidance given to patient.  See health maintenance. Tetanus 2009 zostavax 2015 Flu 2015 PNA 2010 Pap 2015 DXA not due now Mammogram 2015 Colonoscopy 2014 Living will- daughter designated if patient were incapacitated.  Diet and exercise d/w pt.  She is working on both.   Temporal pain resolved, was likely due to dry eyes.  F/u ESR and CBC unremarkable.

## 2015-04-04 NOTE — Patient Instructions (Addendum)
You'll be due for a mammogram in late 05/2015.   Take care.  Glad to see you.   Let me know if you have concerns.

## 2015-04-04 NOTE — Progress Notes (Signed)
Pre visit review using our clinic review tool, if applicable. No additional management support is needed unless otherwise documented below in the visit note.  CPE- See plan.  Routine anticipatory guidance given to patient.  See health maintenance. Tetanus 2009 zostavax 2015 Flu 2015 PNA 2010 Pap 2015 DXA not due now Mammogram 2015 Colonoscopy 2014 Living will- daughter designated if patient were incapacitated.  Diet and exercise d/w pt.  She is working on both.   Temporal pain resolved, was likely due to dry eyes.  F/u ESR and CBC unremarkable.   PMH and SH reviewed  Meds, vitals, and allergies reviewed.   ROS: See HPI.  Otherwise negative.    GEN: nad, alert and oriented HEENT: mucous membranes moist NECK: supple w/o LA CV: rrr. PULM: ctab, no inc wob ABD: soft, +bs EXT: no edema SKIN: no acute rash

## 2015-05-20 LAB — HM MAMMOGRAPHY: HM Mammogram: NORMAL

## 2015-05-27 ENCOUNTER — Encounter: Payer: Self-pay | Admitting: Family Medicine

## 2015-11-04 ENCOUNTER — Ambulatory Visit (INDEPENDENT_AMBULATORY_CARE_PROVIDER_SITE_OTHER): Payer: BLUE CROSS/BLUE SHIELD | Admitting: Family Medicine

## 2015-11-04 ENCOUNTER — Encounter: Payer: Self-pay | Admitting: Family Medicine

## 2015-11-04 VITALS — BP 142/84 | HR 85 | Temp 98.7°F | Wt 183.2 lb

## 2015-11-04 DIAGNOSIS — M7062 Trochanteric bursitis, left hip: Secondary | ICD-10-CM

## 2015-11-04 NOTE — Patient Instructions (Signed)
Likely bursitis.  Ice not heat.  Use the exercises.  Aleve with food for short duration.  Update me if not better.  Take care.  Glad to see you.

## 2015-11-04 NOTE — Progress Notes (Signed)
Pre visit review using our clinic review tool, if applicable. No additional management support is needed unless otherwise documented below in the visit note.  L hip pain.  ttp along the lateral hip.  No trauma.  Pain walking.  No clear trigger.  Going on for about 10 days.  No R sided pain.  No medial L sided pain.  No rash.  No bruising.  Had used heat, not ice, with some relief.  No pop or snap heard.    Will be in town until 11/13/15.    Meds, vitals, and allergies reviewed.   ROS: See HPI.  Otherwise, noncontributory.  nad ncat L hip with normal ROM and not weak but painful/ttp at the greater troch area

## 2015-11-05 DIAGNOSIS — M7060 Trochanteric bursitis, unspecified hip: Secondary | ICD-10-CM | POA: Insufficient documentation

## 2015-11-05 NOTE — Assessment & Plan Note (Signed)
dw pt.  Use ice not heat.  Use the exercises given on handout, anatomy d/w pt.  Aleve with food for short duration.  Update me if not better.  We can likely arrange injection if needed but would be reasonable to defer it for now.  She agrees.

## 2015-11-11 ENCOUNTER — Telehealth: Payer: Self-pay

## 2015-11-11 MED ORDER — HYDROCODONE-ACETAMINOPHEN 5-325 MG PO TABS: 1.0000 | ORAL_TABLET | Freq: Three times a day (TID) | ORAL | Status: DC | PRN

## 2015-11-11 NOTE — Telephone Encounter (Signed)
Patient advised.  Rx left at front desk for pick up. 

## 2015-11-11 NOTE — Telephone Encounter (Signed)
Pt left /vm; pt was seen 11/04/15 with hip pain; the exercises,ice and OTC anti inflammatory is helping but needs pain med to help pt sleep at night due to hip pain; pt going out of country on 11/13/15. Pt request hydrocodone apap 5/325 mg. Pt request cb when rx done.

## 2015-11-11 NOTE — Telephone Encounter (Signed)
Printed.  Thanks.  

## 2016-04-26 ENCOUNTER — Telehealth: Payer: Self-pay

## 2016-04-26 NOTE — Telephone Encounter (Signed)
Please call pt.  If severe pain, then ER.  If not, then okay to add on tomorrow.   Thanks.

## 2016-04-26 NOTE — Telephone Encounter (Signed)
Pain is not severe.  Patient stated she only wanted an appt for Tuesday but nurse line insisted on ER.  Appointment scheduled.

## 2016-04-26 NOTE — Telephone Encounter (Signed)
PLEASE NOTE: All timestamps contained within this report are represented as [place] Standard Time. CONFIDENTIALTY NOTICE: This fax transmission is intended only for the addressee. It contains information that is legally privileged, confidential or otherwise protected from use or disclosure. If you are not the intended recipient, you are strictly prohibited from reviewing, disclosing, copying using or disseminating any of this information or taking any action in reliance on or regarding this information. If you have received this fax in error, please notify us immediately by telephone so that we can arrange for its return to [place]. Phone: [phone], Toll-Free: [phone], Fax: [phone] Page: 1 of 2 Call Id: [id number] [place] Patient Name: Dana Harding Gender: Female DOB: Jan 12, 1951 Age: 65 Y 79 M 13 D Return Phone Number: [phone] (Primary) Address: City/State/Zip: [place] - Client Client Site [place] Physician Renford Dills - MD Contact Type Call Who Is Calling Patient / Member / Family / Caregiver Call Type Triage / Clinical Relationship To Patient Self Return Phone Number [phone] (Primary) Chief Complaint Abdominal Pain Reason for Call Symptomatic / Request for [place] states has been experiencing pain on the left side in the area where the lap band is. Appointment Disposition EMR Appointment Not Necessary Info pasted into Epic No PreDisposition Home Care Translation No Nurse Assessment Nurse: Denyse Amass, RN, Benjamine Mola Date/Time (Eastern Time): 04/26/2016 4:04:22 PM Confirm and document reason for call. If symptomatic, describe symptoms. You must click the next button to save text entered. ---Patient states she has pain on the left side of her abdomen. States she has  had a Lap Band for about 10 years. Has the patient traveled out of the country within the last 30 days? ---Not Applicable Does the patient have any new or worsening symptoms? ---Yes Will a triage be completed? ---Yes Related visit to physician within the last 2 weeks? ---No Does the PT have any chronic conditions? (i.e. diabetes, asthma, etc.) ---No Is this a behavioral health or substance abuse call? ---No Guidelines Guideline Title Affirmed Question Affirmed Notes Nurse Date/Time Eilene Ghazi Time) Abdominal Pain - Upper [1] Pain lasts > 10 minutes AND [2] age > 18 [place], RN, Benjamine Mola 04/26/2016 4:06:41 PM Disp. Time Eilene Ghazi Time) Disposition Final User 04/26/2016 4:10:10 PM Go to ED Now Yes [place], RN, Benjamine Mola PLEASE NOTE: All timestamps contained within this report are represented as [place] Standard Time. CONFIDENTIALTY NOTICE: This fax transmission is intended only for the addressee. It contains information that is legally privileged, confidential or otherwise protected from use or disclosure. If you are not the intended recipient, you are strictly prohibited from reviewing, disclosing, copying using or disseminating any of this information or taking any action in reliance on or regarding this information. If you have received this fax in error, please notify us immediately by telephone so that we can arrange for its return to [place]. Phone: [phone], Toll-Free: [phone], Fax: [phone] Page: 2 of 2 Call Id: [id number] Caller Understands: Yes Disagree/Comply: Comply Care Advice Given Per Guideline GO TO ED NOW: You need to be seen in the Emergency Department. Go to the ER at ___________ [place] now. Drive carefully. DRIVING: Another adult should drive. Do not delay going to the Emergency Department. If immediate transportation is not available via car or taxi, then the patient should be instructed to call [id number]. Referrals [facility] - ED

## 2016-04-27 ENCOUNTER — Ambulatory Visit (INDEPENDENT_AMBULATORY_CARE_PROVIDER_SITE_OTHER): Payer: BLUE CROSS/BLUE SHIELD | Admitting: Family Medicine

## 2016-04-27 ENCOUNTER — Ambulatory Visit (INDEPENDENT_AMBULATORY_CARE_PROVIDER_SITE_OTHER)
Admission: RE | Admit: 2016-04-27 | Discharge: 2016-04-27 | Disposition: A | Discharge status: Home / Self Care | Payer: BLUE CROSS/BLUE SHIELD | Source: Ambulatory Visit | Attending: Family Medicine | Admitting: Family Medicine

## 2016-04-27 ENCOUNTER — Encounter: Payer: Self-pay | Admitting: Family Medicine

## 2016-04-27 VITALS — BP 118/80 | HR 62 | Temp 98.5°F | Wt 186.0 lb

## 2016-04-27 DIAGNOSIS — R1012 Left upper quadrant pain: Secondary | ICD-10-CM

## 2016-04-27 DIAGNOSIS — R109 Unspecified abdominal pain: Secondary | ICD-10-CM | POA: Diagnosis not present

## 2016-04-27 NOTE — Patient Instructions (Signed)
Go to the lab on the way out.  We'll contact you with your xray report. I'll check with Dr. Evorn Gong in the meantime.

## 2016-04-27 NOTE — Progress Notes (Signed)
Pre visit review using our clinic review tool, if applicable. No additional management support is needed unless otherwise documented below in the visit note.  H/o lab band surgery.  L sided abd pain.  'Aching' and she can make it ease some with repositioning, local massage.  Going on for a few months, intermittently.  LUQ pain.  No vomiting.  Indigestion with dietary indiscretion.  Last talked to surgen about lap band a few years ago and didn't have troubles at that point.   She has a different LLQ pain, sharper pain, clearly feels different from the LUQ pain.  Intermittent, with position change.  No bowel changes. No blood in stool.  H/o tummy tuck and h/o inguinal repair.  Pain is brief when it happens.    Meds, vitals, and allergies reviewed.   ROS: Per HPI unless specifically indicated in ROS section   nad ncat Neck supple, no LA rrr ctab I can feel the port from the lab band on the LUQ- that is the area that is tender but no bruising or rebound.  Minimally ttp LLQ, no rebound Normal BS No rash.

## 2016-04-28 NOTE — Assessment & Plan Note (Signed)
Unclear- but possibly related to prev lap band surgery.  I'll check with Dr. Evorn Gong in the meantime.  Check plain films today.  It's not that I think she has incorrect hardware placement or emergent issues (ie perf/ulceration), but I question if she could have scar tissue/irritation locally.  D/w pt.  See notes on imaging.    The LLQ pain seems to be limited to the abd wall, either a strain or scar tissue from prev surgeries.  This doesn't appear to be intraabdominal.  D/w pt.  She agrees.  Monitor for now.

## 2016-04-29 ENCOUNTER — Telehealth: Payer: Self-pay | Admitting: Family Medicine

## 2016-04-29 NOTE — Telephone Encounter (Signed)
Requested records faxed.  Patient advised.

## 2016-04-29 NOTE — Telephone Encounter (Signed)
Call pt.  I talked with Dr. Evorn Gong.   They'll call about seeing her in clinic.  Please send last OV note and xrays to   Heron Nay, MD Bariatric Surgery  General Surgery [facility] - [place] [phone]  Thanks.

## 2016-05-24 ENCOUNTER — Other Ambulatory Visit: Payer: Self-pay | Admitting: Family Medicine

## 2016-05-24 NOTE — Telephone Encounter (Signed)
Rx called in as directed.   

## 2016-05-24 NOTE — Telephone Encounter (Signed)
Noted.  Prn use prev.  Okay to fill, thank for calling in.

## 2016-05-24 NOTE — Telephone Encounter (Signed)
Dr. Damita Dunnings out of the office, last filled on 01/03/14 #60 with 0 refill, please advise

## 2016-05-24 NOTE — Telephone Encounter (Signed)
plz phone in. Will cc pcp as fyi

## 2016-05-27 ENCOUNTER — Encounter: Payer: Self-pay | Admitting: Family Medicine

## 2016-05-28 ENCOUNTER — Encounter: Payer: Self-pay | Admitting: Family Medicine

## 2016-06-01 ENCOUNTER — Telehealth: Payer: Self-pay | Admitting: Family Medicine

## 2016-06-01 NOTE — Telephone Encounter (Signed)
Pt returned you call regarding mammo  [phone]

## 2016-10-19 ENCOUNTER — Encounter: Payer: Self-pay | Admitting: Family Medicine

## 2016-11-18 LAB — HM DEXA SCAN

## 2016-11-18 LAB — HM MAMMOGRAPHY

## 2016-11-24 ENCOUNTER — Encounter: Payer: Self-pay | Admitting: Family Medicine

## 2016-11-24 DIAGNOSIS — M8580 Other specified disorders of bone density and structure, unspecified site: Secondary | ICD-10-CM | POA: Insufficient documentation

## 2016-11-30 ENCOUNTER — Encounter: Payer: Self-pay | Admitting: Family Medicine

## 2016-11-30 ENCOUNTER — Encounter: Payer: Self-pay | Admitting: *Deleted

## 2016-12-31 ENCOUNTER — Ambulatory Visit (INDEPENDENT_AMBULATORY_CARE_PROVIDER_SITE_OTHER): Payer: BLUE CROSS/BLUE SHIELD | Admitting: Family Medicine

## 2016-12-31 ENCOUNTER — Encounter: Payer: Self-pay | Admitting: Family Medicine

## 2016-12-31 VITALS — BP 120/78 | HR 84 | Temp 98.3°F | Wt 173.0 lb

## 2016-12-31 DIAGNOSIS — N760 Acute vaginitis: Secondary | ICD-10-CM

## 2016-12-31 DIAGNOSIS — L298 Other pruritus: Secondary | ICD-10-CM | POA: Diagnosis not present

## 2016-12-31 DIAGNOSIS — R202 Paresthesia of skin: Secondary | ICD-10-CM

## 2016-12-31 DIAGNOSIS — R21 Rash and other nonspecific skin eruption: Secondary | ICD-10-CM

## 2016-12-31 DIAGNOSIS — N898 Other specified noninflammatory disorders of vagina: Secondary | ICD-10-CM

## 2016-12-31 LAB — COMPREHENSIVE METABOLIC PANEL
ALK PHOS: 83 U/L (ref 39–117)
ALT: 13 U/L (ref 0–35)
AST: 17 U/L (ref 0–37)
Albumin: 4.3 g/dL (ref 3.5–5.2)
BUN: 14 mg/dL (ref 6–23)
CALCIUM: 9.8 mg/dL (ref 8.4–10.5)
CO2: 32 mEq/L (ref 19–32)
Chloride: 103 mEq/L (ref 96–112)
Creatinine, Ser: 0.92 mg/dL (ref 0.40–1.20)
GFR: 79.01 mL/min (ref >60.00)
Glucose, Bld: 106 mg/dL — ABNORMAL HIGH (ref 70–99)
POTASSIUM: 4.2 meq/L (ref 3.5–5.1)
Sodium: 141 mEq/L (ref 135–145)
TOTAL PROTEIN: 7.6 g/dL (ref 6.0–8.3)
Total Bilirubin: 0.7 mg/dL (ref 0.2–1.2)

## 2016-12-31 LAB — CBC WITH DIFFERENTIAL/PLATELET
Basophils Absolute: 0 10*3/uL (ref 0.0–0.1)
Basophils Relative: 0.6 % (ref 0.0–3.0)
EOS PCT: 3.1 % (ref 0.0–5.0)
Eosinophils Absolute: 0.2 10*3/uL (ref 0.0–0.7)
HCT: 40.4 % (ref 36.0–46.0)
HEMOGLOBIN: 13.4 g/dL (ref 12.0–15.0)
LYMPHS PCT: 33.2 % (ref 12.0–46.0)
Lymphs Abs: 2.1 10*3/uL (ref 0.7–4.0)
MCHC: 33.2 g/dL (ref 30.0–36.0)
MCV: 82.9 fl (ref 78.0–100.0)
MONOS PCT: 6.9 % (ref 3.0–12.0)
Monocytes Absolute: 0.4 10*3/uL (ref 0.1–1.0)
Neutro Abs: 3.5 10*3/uL (ref 1.4–7.7)
Neutrophils Relative %: 56.2 % (ref 43.0–77.0)
Platelets: 344 10*3/uL (ref 150.0–400.0)
RBC: 4.87 Mil/uL (ref 3.87–5.11)
RDW: 15.6 % — ABNORMAL HIGH (ref 11.5–15.5)
WBC: 6.2 10*3/uL (ref 4.0–10.5)

## 2016-12-31 LAB — POC URINALSYSI DIPSTICK (AUTOMATED)
Bilirubin, UA: NEGATIVE
GLUCOSE UA: NEGATIVE
KETONES UA: NEGATIVE
Leukocytes, UA: NEGATIVE
Nitrite, UA: POSITIVE
PROTEIN UA: NEGATIVE
Urobilinogen, UA: NEGATIVE
pH, UA: 6

## 2016-12-31 LAB — VITAMIN B12: VITAMIN B 12: 586 pg/mL (ref 211–911)

## 2016-12-31 LAB — TSH: TSH: 0.89 u[IU]/mL (ref 0.35–4.50)

## 2016-12-31 MED ORDER — CLOTRIMAZOLE-BETAMETHASONE 1-0.05 % EX CREA: 1.0000 "application " | TOPICAL_CREAM | Freq: Two times a day (BID) | CUTANEOUS | 1 refills | Status: DC

## 2016-12-31 MED ORDER — FLUCONAZOLE 150 MG PO TABS: 150.0000 mg | ORAL_TABLET | Freq: Once | ORAL | 0 refills | Status: DC

## 2016-12-31 NOTE — Progress Notes (Signed)
Rare use of most meds (pain meds, muscle relaxers, etc).  Has used librax prn with relief of esophageal spasm.    Skin complaint.  Patches of itchy and scaly skin in the scalp.  Some lesion on the legs.  Not lifelong, recently noted.  No meds tried other than otc lotion.  Lesions are itchy, scalp more than leg lesion.  Lesions present for about 3 weeks.   No new hair treatment.  Hair dresser notes the lesions.   She doesn't fell unwell o/w.   She hasn't had clumps of hair coming out in her comb.    Prev biopsy for similar lesions years ago, bx neg for sarcoid, per patient report.    Numbness. Intermittent in the hands and L foot, not R foot.  Going on intermittently for months.  No weakness.  Altered sensation, not total loss of sensation, has pins and needles sensation.    Vaginal itching.  External burning with urination.  Started about 4 weeks ago 'and I thought it would go away.'.  Some discharge, thick and whitish.  No VB.  No FCNAVD.    Meds, vitals, and allergies reviewed.   ROS: Per HPI unless specifically indicated in ROS section   GEN: nad, alert and oriented HEENT: mucous membranes moist NECK: supple w/o LA CV: rrr.  no murmur PULM: ctab, no inc wob EXT: no edema SKIN: dry small scaly patches with some local hair thinning noted on the scalp, in irregular distribution B.   Small hyperpigmented mildly irritated macules on the lower legs B, not ttp, no red or hot.   CN 2-12 wnl B, S/S wnl x4, sensation intact on the ext for sensation and monofilament B.  Tinel neg at the wrists B  U/a d/w pt.  ucx pending.

## 2016-12-31 NOTE — Assessment & Plan Note (Signed)
Unclear if she could have compressive cause (B wrists, L heel), would check basic labs today.  Sensation intact today on exam, no weakness, CN wnl, d/w pt.  Okay for outpatient f/u.

## 2016-12-31 NOTE — Patient Instructions (Signed)
Go to the lab on the way out.  We'll contact you with your lab report. Use the cream and lotrisone.   If not better, then let me know.  Take care.  Glad to see you.

## 2016-12-31 NOTE — Assessment & Plan Note (Signed)
Legs and scalp, unclear if either or both could be fungal or inflammatory, d/w pt.  Would proceed with Lotrisone and she'll update me.

## 2016-12-31 NOTE — Assessment & Plan Note (Signed)
Presumed vaginitis, likely fungal, even if WP neg, I would still treat, so defer WP and pelvic exam for now.  D/w pt.  She understood rationale and agreed.  I think the external irritation likely caused the u/a to be positive, will check ucx.  She agrees.  Start diflucan.   >25 minutes spent in face to face time with patient, >50% spent in counselling or coordination of care.

## 2017-01-03 LAB — URINE CULTURE

## 2017-01-17 ENCOUNTER — Encounter: Payer: Self-pay | Admitting: Family Medicine

## 2017-01-18 ENCOUNTER — Other Ambulatory Visit: Payer: Self-pay | Admitting: Family Medicine

## 2017-01-18 MED ORDER — FLUCONAZOLE 150 MG PO TABS
150.0000 mg | ORAL_TABLET | Freq: Once | ORAL | 0 refills | Status: AC
Start: 2017-01-18 — End: 2017-01-18

## 2017-02-27 ENCOUNTER — Other Ambulatory Visit: Payer: Self-pay | Admitting: Family Medicine

## 2017-02-27 ENCOUNTER — Encounter: Payer: Self-pay | Admitting: Family Medicine

## 2017-02-27 MED ORDER — FLUCONAZOLE 150 MG PO TABS: 150.0000 mg | ORAL_TABLET | Freq: Once | ORAL | 0 refills | Status: DC

## 2017-03-15 ENCOUNTER — Other Ambulatory Visit: Payer: Self-pay | Admitting: Family Medicine

## 2017-03-15 DIAGNOSIS — N898 Other specified noninflammatory disorders of vagina: Secondary | ICD-10-CM

## 2017-03-15 NOTE — Telephone Encounter (Signed)
Received refill electronically Last office visit 12/31/16 Medication is no longer on med list

## 2017-03-16 NOTE — Telephone Encounter (Signed)
Patient notified as instructed by telephone and verbalized understanding. Patient stated that she will go with gynecology. Advised patient that Dr. Damita Dunnings will put the referral in and one of the referral coordinators will be in touch to get this set up for her.

## 2017-03-16 NOTE — Telephone Encounter (Signed)
Sent. We can either get her back in for further evaluation here or get her set up with gynecology. Let me know which way she wants to go. I'm okay with either. Thanks.

## 2017-03-16 NOTE — Telephone Encounter (Signed)
Ordered. Thanks

## 2017-03-16 NOTE — Telephone Encounter (Signed)
Spoke to patient and was advised that she is doing better, but took the medication again last Tuesday. Patient stated that it seems like when she takes the medication she gets better and then the symptoms come back with the itching and tenderness after she if is off the medication. .  Patient stated that she wants to keep the medication on hand because of the ongoing symptoms. Patient stated that she does not know what is going on, but continues to have the problem.

## 2017-03-16 NOTE — Telephone Encounter (Signed)
Please get update from patient. Let me know how she is doing. Thanks.

## 2017-03-24 DIAGNOSIS — L300 Nummular dermatitis: Secondary | ICD-10-CM | POA: Insufficient documentation

## 2017-04-20 LAB — HM PAP SMEAR: HM PAP: NEGATIVE

## 2017-05-19 ENCOUNTER — Encounter: Payer: Self-pay | Admitting: Family Medicine

## 2017-08-01 ENCOUNTER — Encounter: Payer: Self-pay | Admitting: Family Medicine

## 2017-11-11 ENCOUNTER — Other Ambulatory Visit: Payer: Self-pay | Admitting: Family Medicine

## 2017-11-11 NOTE — Telephone Encounter (Signed)
Copied from [place] [id number]. Topic: Quick Communication - Rx Refill/Question >> Nov 10, 2017 10:33 AM Arletha Grippe wrote: Has the patient contacted their pharmacy? No. Says no refills, uses sparingly    (Agent: If no, request that the patient contact the pharmacy for the refill.)   Preferred Pharmacy (with phone number or street name): ALPRAZolam (XANAX) 0.5 MG tablet, [facility] [address] . Pt cb number is [phone]    Agent: Please be advised that RX refills may take up to 3 business days. We ask that you follow-up with your pharmacy. /  Refill request for Xanax / LOV 12/31/16 with Dr. Damita Dunnings  /

## 2017-11-11 NOTE — Telephone Encounter (Signed)
Lm on pt vm and advised to schedule an office visit. Pts last OV 12/2016 and last Xanax Rx 2012

## 2017-11-22 ENCOUNTER — Ambulatory Visit (INDEPENDENT_AMBULATORY_CARE_PROVIDER_SITE_OTHER): Payer: Medicare Other | Admitting: Family Medicine

## 2017-11-22 ENCOUNTER — Encounter: Payer: Self-pay | Admitting: Family Medicine

## 2017-11-22 VITALS — BP 102/62 | HR 84 | Temp 98.9°F | Wt 174.5 lb

## 2017-11-22 DIAGNOSIS — J069 Acute upper respiratory infection, unspecified: Secondary | ICD-10-CM

## 2017-11-22 DIAGNOSIS — Z23 Encounter for immunization: Secondary | ICD-10-CM

## 2017-11-22 DIAGNOSIS — F419 Anxiety disorder, unspecified: Secondary | ICD-10-CM

## 2017-11-22 MED ORDER — ALPRAZOLAM 0.5 MG PO TABS: 0.2500 mg | ORAL_TABLET | Freq: Every day | ORAL | 0 refills | Status: DC | PRN

## 2017-11-22 NOTE — Progress Notes (Signed)
Rare use of hydrocodone.  She has used librax prn with relief.  No ADE on either med.  D/w pt.    Cough and congestion.  Was going on for about 2 weeks.  She is some better now.  Rhinorrhea getting some better.  No fevers.  No one else is sick except for grandkids with colds episodically.    She hadn't had a flu shot yet.   D/w pt.  Okay for vaccination today.   Her sister has bladder cancer.  She is going to go to [place] to help care for her sister.  D/w pt.  She has used xanax rarely, prn.  Stressor noted, d/w pt.    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  She has raynaud's sx x4 at baseline, hand>feet.  She is tolerating current symptoms.  Meds, vitals, and allergies reviewed.   ROS: Per HPI unless specifically indicated in ROS section   GEN: nad, alert and oriented HEENT: mucous membranes moist, tm w/o erythema, nasal exam w/o erythema, clear discharge noted,  OP with cobblestoning, sinuses nontender to palpation x4  NECK: supple w/o LA CV: rrr.   PULM: ctab, no inc wob EXT: no edema

## 2017-11-22 NOTE — Patient Instructions (Signed)
Take care. Glad to see you.  Rest and fluids, use nasal saline, gargle with saline.   Update me as needed.

## 2017-11-23 DIAGNOSIS — J069 Acute upper respiratory infection, unspecified: Secondary | ICD-10-CM | POA: Insufficient documentation

## 2017-11-23 DIAGNOSIS — F419 Anxiety disorder, unspecified: Secondary | ICD-10-CM | POA: Insufficient documentation

## 2017-11-23 NOTE — Assessment & Plan Note (Signed)
Significant stressor noted with her sister's illness.  She is use Xanax rarely.  Routine cautions given.  Use as needed.  Update me as needed.  She agrees.

## 2017-11-23 NOTE — Assessment & Plan Note (Signed)
Likely viral.  Nontoxic.  She is some better in the meantime.  Not likely to benefit from antibiotic treatment.  Discussed with patient, understood.  Update me as needed.  Okay to vaccinated for flu today.  Lungs are clear.

## 2018-07-06 DIAGNOSIS — H524 Presbyopia: Secondary | ICD-10-CM | POA: Diagnosis not present

## 2018-07-06 DIAGNOSIS — H04123 Dry eye syndrome of bilateral lacrimal glands: Secondary | ICD-10-CM | POA: Diagnosis not present

## 2018-07-06 DIAGNOSIS — H5203 Hypermetropia, bilateral: Secondary | ICD-10-CM | POA: Diagnosis not present

## 2018-07-06 DIAGNOSIS — H52223 Regular astigmatism, bilateral: Secondary | ICD-10-CM | POA: Diagnosis not present

## 2018-07-06 DIAGNOSIS — H353132 Nonexudative age-related macular degeneration, bilateral, intermediate dry stage: Secondary | ICD-10-CM | POA: Diagnosis not present

## 2018-07-06 DIAGNOSIS — H2513 Age-related nuclear cataract, bilateral: Secondary | ICD-10-CM | POA: Diagnosis not present

## 2018-07-07 ENCOUNTER — Encounter: Payer: Self-pay | Admitting: Family Medicine

## 2018-07-07 ENCOUNTER — Ambulatory Visit (INDEPENDENT_AMBULATORY_CARE_PROVIDER_SITE_OTHER): Payer: Medicare Other | Admitting: Family Medicine

## 2018-07-07 VITALS — BP 120/90 | HR 70 | Temp 98.7°F | Ht 68.0 in | Wt 168.5 lb

## 2018-07-07 DIAGNOSIS — Z1231 Encounter for screening mammogram for malignant neoplasm of breast: Secondary | ICD-10-CM | POA: Diagnosis not present

## 2018-07-07 DIAGNOSIS — Z803 Family history of malignant neoplasm of breast: Secondary | ICD-10-CM | POA: Diagnosis not present

## 2018-07-07 DIAGNOSIS — J392 Other diseases of pharynx: Secondary | ICD-10-CM

## 2018-07-07 DIAGNOSIS — R350 Frequency of micturition: Secondary | ICD-10-CM | POA: Diagnosis not present

## 2018-07-07 LAB — HM MAMMOGRAPHY

## 2018-07-07 LAB — POC URINALSYSI DIPSTICK (AUTOMATED)
Bilirubin, UA: NEGATIVE
Glucose, UA: NEGATIVE
Ketones, UA: NEGATIVE
LEUKOCYTES UA: NEGATIVE
PROTEIN UA: NEGATIVE
UROBILINOGEN UA: 0.2 U/dL
pH, UA: 6 (ref 5.0–8.0)

## 2018-07-07 LAB — URINALYSIS, MICROSCOPIC ONLY

## 2018-07-07 MED ORDER — OMEPRAZOLE 20 MG PO CPDR: 20.0000 mg | DELAYED_RELEASE_CAPSULE | Freq: Every day | ORAL | 3 refills | Status: DC

## 2018-07-07 MED ORDER — OXYBUTYNIN CHLORIDE ER 5 MG PO TB24: 5.0000 mg | ORAL_TABLET | Freq: Every day | ORAL | 3 refills | Status: DC

## 2018-07-07 NOTE — Progress Notes (Signed)
She has been back and forth to [place] seeing her sister who had sig abd surgery with bladder and neuro/endocrine cancer in the small intestine.  Her sister is in ALF in [place] in the meantime.  D/w pt.    Scratchy throat.  Started when she was travelling to [facility].  Persistent/intermittent in the meantime.  She can have sensation on either side of the throat, for about 4 months.  No fevers.  No vomiting.  No heartburn.  No dysphagia.  Some rhinorrhea today but that was a recent issue.  No wheeze.  No throat clearing.  She has some hoarseness in the last few days and that is a recent issue, not longstanding.  She didn't think this was from post nasal gtt.    Rare nsaid use, about once a week.    She has OAB sx with urgency.  Having to wear a pad. No trouble with cough/laugh/sneeze.  No blood or pain with urination.  She had a tendency for urgency for years but worse recently.  She feels well o/w.  Intentional weight loss with diet and exercise.    Meds, vitals, and allergies reviewed.   ROS: Per HPI unless specifically indicated in ROS section   GEN: nad, alert and oriented HEENT: mucous membranes moist, Tm wnl, nasal exam not stuffy or erythematous. OP without exudates.  No ulceration. NECK: supple w/o LA CV: rrr PULM: ctab, no inc wob ABD: soft, +bs EXT: no edema SKIN: well perfused.

## 2018-07-07 NOTE — Patient Instructions (Addendum)
Schedule a physical/yearly visit when possible.  Try taking prilosec 20mg  a day and update me if not better in the next 2 weeks or if worse in the meantime.   If not better we can set you up with ENT.  Try taking 1-2 tabs of ditropan and update me as needed.  Take care.  Glad to see you.

## 2018-07-08 ENCOUNTER — Encounter: Payer: Self-pay | Admitting: Family Medicine

## 2018-07-08 DIAGNOSIS — J392 Other diseases of pharynx: Secondary | ICD-10-CM | POA: Insufficient documentation

## 2018-07-08 DIAGNOSIS — R350 Frequency of micturition: Secondary | ICD-10-CM | POA: Insufficient documentation

## 2018-07-08 NOTE — Assessment & Plan Note (Addendum)
She does not have stridor.  No lymphadenopathy.  No alarming symptoms.  This does not feel like typical postnasal drip symptoms for her.  Going on for a few months.  He could be that she has occult gastroesophageal reflux disease or LPR.  Discussed with patient.  I see several options.  She could start empiric PPI, she could see ENT, or she could do both.  She wanted to try omeprazole therapy and then update me as needed.  Rationale discussed with patient.  She agrees.  Okay for outpatient follow-up.  If her symptoms do not improve then we can set her up with ear nose and throat.  >25 minutes spent in face to face time with patient, >50% spent in counselling or coordination of care.

## 2018-07-08 NOTE — Assessment & Plan Note (Signed)
Likely from overactive bladder.  She does not have typical stress urinary incontinence symptoms.  Discussed with patient.  Her sister did have history of bladder cancer.  Reasonable to check UA today.  It is possible that she has false positive hematuria on the dip.  Check micro and urine culture.  Discussed with patient.  She understood.  Okay for outpatient follow-up. She can try taking 1-2 tabs of ditropan and update me as needed.

## 2018-07-09 LAB — URINE CULTURE
MICRO NUMBER:: 90916335
SPECIMEN QUALITY: ADEQUATE

## 2018-07-10 ENCOUNTER — Other Ambulatory Visit: Payer: Self-pay | Admitting: Family Medicine

## 2018-07-10 MED ORDER — SULFAMETHOXAZOLE-TRIMETHOPRIM 800-160 MG PO TABS: 1.0000 | ORAL_TABLET | Freq: Two times a day (BID) | ORAL | 0 refills | Status: DC

## 2018-07-13 ENCOUNTER — Encounter: Payer: Self-pay | Admitting: Family Medicine

## 2018-07-31 ENCOUNTER — Other Ambulatory Visit: Payer: Self-pay | Admitting: Family Medicine

## 2018-07-31 NOTE — Telephone Encounter (Signed)
Copied from [place] [id number]. Topic: Quick Communication - Rx Refill/Question >> Jul 31, 2018  9:33 AM Marin Olp L wrote: Medication: clidinium-chlordiazePOXIDE (LIBRAX) 5-2.5 MG capsule   Has the patient contacted their pharmacy? Yes.   (Agent: If no, request that the patient contact the pharmacy for the refill.) (Agent: If yes, when and what did the pharmacy advise?)  Preferred Pharmacy (with phone number or street name[id number] [place] [address] [place] [address] Phone: [phone] Fax: [phone]  Agent: Please be advised that RX refills may take up to 3 business days. We ask that you follow-up with your pharmacy.

## 2018-07-31 NOTE — Telephone Encounter (Signed)
Refill of Librax  LRF 05/24/16  #60  0 refillls  LOV 07/07/18 Dr. Damita Dunnings  [id number] [place] [place] - [address] [place] [address] Phone: [phone] Fax: [phone]

## 2018-08-01 MED ORDER — CILIDINIUM-CHLORDIAZEPOXIDE 2.5-5 MG PO CAPS: 1.0000 | ORAL_CAPSULE | Freq: Three times a day (TID) | ORAL | 3 refills | Status: DC

## 2018-08-01 NOTE — Telephone Encounter (Signed)
Please call in.  Thanks.   

## 2018-08-01 NOTE — Telephone Encounter (Signed)
Medication phoned to pharmacy.  

## 2018-10-31 ENCOUNTER — Telehealth: Payer: Self-pay | Admitting: Family Medicine

## 2018-10-31 MED ORDER — OXYBUTYNIN CHLORIDE ER 10 MG PO TB24: 10.0000 mg | ORAL_TABLET | Freq: Every day | ORAL | 12 refills | Status: DC

## 2018-10-31 NOTE — Telephone Encounter (Signed)
Changed, sent.  Thanks.

## 2018-10-31 NOTE — Telephone Encounter (Signed)
Pt called office about the Rx she was prescribed, Oxybutynin. Pt says her insurance only covers a 30 day supply at 1 tablet per day. Pt says she was prescribed 1-2 tablets per day 5mg . She is requesting the Rx to be changed to 10mg  so that she can do 1 tablet per day. [facility] [address], [place].

## 2018-11-08 ENCOUNTER — Telehealth: Payer: Self-pay

## 2018-11-08 NOTE — Telephone Encounter (Signed)
For one week pt has had dull continuous H/A on rt side of head. Pt questions if there is feeling of pressure on rt eye but pt is not sure. No vision changes and no dizziness. Pain level is 2 - 3. Offered pt a soon appt with provider at [facility] but pt only wants to see Dr Damita Dunnings. Pt scheduled appt with Dr Damita Dunnings 11/10/18 at 10:30 AM. If pt condition changes or worsens prior to appt pt will go to [facility] or ED. FYI to Dr Damita Dunnings.

## 2018-11-09 NOTE — Telephone Encounter (Signed)
Noted. Thanks. Agreed.  

## 2018-11-10 ENCOUNTER — Ambulatory Visit: Payer: Medicare Other | Admitting: Family Medicine

## 2018-11-13 ENCOUNTER — Ambulatory Visit (INDEPENDENT_AMBULATORY_CARE_PROVIDER_SITE_OTHER): Payer: Medicare Other | Admitting: Family Medicine

## 2018-11-13 ENCOUNTER — Encounter: Payer: Self-pay | Admitting: Family Medicine

## 2018-11-13 VITALS — BP 142/84 | HR 67 | Temp 98.9°F | Ht 68.0 in | Wt 164.5 lb

## 2018-11-13 DIAGNOSIS — R51 Headache: Secondary | ICD-10-CM

## 2018-11-13 DIAGNOSIS — R519 Headache, unspecified: Secondary | ICD-10-CM

## 2018-11-13 LAB — SEDIMENTATION RATE: SED RATE: 7 mm/h (ref 0–30)

## 2018-11-13 NOTE — Patient Instructions (Signed)
Go to the lab on the way out.  We'll contact you with your lab report. If you have more trouble then let me know.  If you have more episodes of heart racing the let me know.  Call about an eye clinic appointment.  Take care.  Glad to see you.

## 2018-11-13 NOTE — Progress Notes (Signed)
Oxybutynin 10mg  helped, d/w pt.  No ADE on med other than minimal dry mouth but that is tolerable.    Headaches.  Started last month.  Dull R sided HA.  She was attributing it to stress and was trying to ignore it.  She has cataracts.  She wears glasses.  She has f/u pending with the eye clinic.  Still with dull HA on the R side. No L sided sx.  No FCNAVD except for 2 episodes of sporadic nausea.  No vision changes except for known h/o ocular migraines and she has one about two weeks prior to HA onset.  That isn't a new or changed issue.  No vision loss o/w.    No other focal neuro sx.  No weakness.    She had episodic episodes of regular elevated heart rate that would self resolve.  Lasted about 15-20 seconds.  No syncope.  No exertional CP.  She had noted that since the HA started.   Meds, vitals, and allergies reviewed.   ROS: Per HPI unless specifically indicated in ROS section   GEN: nad, alert and oriented HEENT: mucous membranes moist NECK: supple w/o LA CV: rrr. PULM: ctab, no inc wob ABD: soft, +bs EXT: no edema SKIN: no acute rash CN 2-12 wnl B, S/S/DTR wnl x4

## 2018-11-15 DIAGNOSIS — N3281 Overactive bladder: Secondary | ICD-10-CM | POA: Diagnosis not present

## 2018-11-15 DIAGNOSIS — Z6825 Body mass index (BMI) 25.0-25.9, adult: Secondary | ICD-10-CM | POA: Diagnosis not present

## 2018-11-15 DIAGNOSIS — Z01419 Encounter for gynecological examination (general) (routine) without abnormal findings: Secondary | ICD-10-CM | POA: Diagnosis not present

## 2018-11-15 DIAGNOSIS — N958 Other specified menopausal and perimenopausal disorders: Secondary | ICD-10-CM | POA: Diagnosis not present

## 2018-11-15 LAB — HM DEXA SCAN: HM DEXA SCAN: NORMAL

## 2018-11-16 DIAGNOSIS — H52223 Regular astigmatism, bilateral: Secondary | ICD-10-CM | POA: Diagnosis not present

## 2018-11-16 DIAGNOSIS — R51 Headache: Secondary | ICD-10-CM | POA: Diagnosis not present

## 2018-11-16 DIAGNOSIS — R519 Headache, unspecified: Secondary | ICD-10-CM | POA: Insufficient documentation

## 2018-11-16 DIAGNOSIS — H353132 Nonexudative age-related macular degeneration, bilateral, intermediate dry stage: Secondary | ICD-10-CM | POA: Diagnosis not present

## 2018-11-16 DIAGNOSIS — H2513 Age-related nuclear cataract, bilateral: Secondary | ICD-10-CM | POA: Diagnosis not present

## 2018-11-16 DIAGNOSIS — H04123 Dry eye syndrome of bilateral lacrimal glands: Secondary | ICD-10-CM | POA: Diagnosis not present

## 2018-11-16 DIAGNOSIS — H5203 Hypermetropia, bilateral: Secondary | ICD-10-CM | POA: Diagnosis not present

## 2018-11-16 DIAGNOSIS — H524 Presbyopia: Secondary | ICD-10-CM | POA: Diagnosis not present

## 2018-11-16 NOTE — Assessment & Plan Note (Signed)
Normal neurologic exam.  Reasonable to check sed rate today.  I do not suspect temporal arteritis but it would be reasonable to get sed rate to exclude this.  Okay for outpatient follow-up.  If her sed rate is normal I want her to follow-up with the eye clinic.  It may be that she needs a change in her prescription lenses.  I also talked her about her heart rate.  She has episodes of slightly elevated heart rate that are still regular, not irregular.  If she continues to have trouble with that then I want her to let me know.

## 2018-11-22 DIAGNOSIS — Z803 Family history of malignant neoplasm of breast: Secondary | ICD-10-CM | POA: Diagnosis not present

## 2018-12-04 DIAGNOSIS — H2511 Age-related nuclear cataract, right eye: Secondary | ICD-10-CM | POA: Diagnosis not present

## 2018-12-04 DIAGNOSIS — H2513 Age-related nuclear cataract, bilateral: Secondary | ICD-10-CM | POA: Diagnosis not present

## 2018-12-06 DIAGNOSIS — H269 Unspecified cataract: Secondary | ICD-10-CM

## 2018-12-06 HISTORY — DX: Unspecified cataract: H26.9

## 2018-12-11 DIAGNOSIS — H2511 Age-related nuclear cataract, right eye: Secondary | ICD-10-CM | POA: Diagnosis not present

## 2018-12-11 DIAGNOSIS — H2512 Age-related nuclear cataract, left eye: Secondary | ICD-10-CM | POA: Diagnosis not present

## 2018-12-11 DIAGNOSIS — H25811 Combined forms of age-related cataract, right eye: Secondary | ICD-10-CM | POA: Diagnosis not present

## 2018-12-11 HISTORY — PX: CATARACT EXTRACTION W/ INTRAOCULAR LENS IMPLANT: SHX1309

## 2018-12-27 ENCOUNTER — Ambulatory Visit (INDEPENDENT_AMBULATORY_CARE_PROVIDER_SITE_OTHER): Payer: Medicare Other

## 2018-12-27 VITALS — BP 122/78 | HR 67 | Temp 98.3°F | Ht 68.25 in | Wt 162.2 lb

## 2018-12-27 DIAGNOSIS — Z13220 Encounter for screening for lipoid disorders: Secondary | ICD-10-CM

## 2018-12-27 DIAGNOSIS — Z1159 Encounter for screening for other viral diseases: Secondary | ICD-10-CM | POA: Diagnosis not present

## 2018-12-27 DIAGNOSIS — G4733 Obstructive sleep apnea (adult) (pediatric): Secondary | ICD-10-CM | POA: Diagnosis not present

## 2018-12-27 DIAGNOSIS — R202 Paresthesia of skin: Secondary | ICD-10-CM | POA: Diagnosis not present

## 2018-12-27 DIAGNOSIS — Z1389 Encounter for screening for other disorder: Secondary | ICD-10-CM | POA: Diagnosis not present

## 2018-12-27 DIAGNOSIS — Z0000 Encounter for general adult medical examination without abnormal findings: Secondary | ICD-10-CM | POA: Diagnosis not present

## 2018-12-27 LAB — TSH: TSH: 1.06 u[IU]/mL (ref 0.35–4.50)

## 2018-12-27 LAB — COMPREHENSIVE METABOLIC PANEL
ALBUMIN: 4.2 g/dL (ref 3.5–5.2)
ALK PHOS: 95 U/L (ref 39–117)
ALT: 15 U/L (ref 0–35)
AST: 19 U/L (ref 0–37)
BUN: 11 mg/dL (ref 6–23)
CO2: 29 mEq/L (ref 19–32)
Calcium: 10 mg/dL (ref 8.4–10.5)
Chloride: 105 mEq/L (ref 96–112)
Creatinine, Ser: 1 mg/dL (ref 0.40–1.20)
GFR: 67.1 mL/min (ref >60.00)
Glucose, Bld: 94 mg/dL (ref 70–99)
POTASSIUM: 4.4 meq/L (ref 3.5–5.1)
Sodium: 141 mEq/L (ref 135–145)
TOTAL PROTEIN: 7.3 g/dL (ref 6.0–8.3)
Total Bilirubin: 0.9 mg/dL (ref 0.2–1.2)

## 2018-12-27 LAB — CBC WITH DIFFERENTIAL/PLATELET
Basophils Absolute: 0 10*3/uL (ref 0.0–0.1)
Basophils Relative: 0.8 % (ref 0.0–3.0)
Eosinophils Absolute: 0.2 10*3/uL (ref 0.0–0.7)
Eosinophils Relative: 4.2 % (ref 0.0–5.0)
HEMATOCRIT: 39.7 % (ref 36.0–46.0)
HEMOGLOBIN: 13.1 g/dL (ref 12.0–15.0)
LYMPHS PCT: 32 % (ref 12.0–46.0)
Lymphs Abs: 1.7 10*3/uL (ref 0.7–4.0)
MCHC: 33.1 g/dL (ref 30.0–36.0)
MCV: 85.6 fl (ref 78.0–100.0)
MONO ABS: 0.4 10*3/uL (ref 0.1–1.0)
MONOS PCT: 6.8 % (ref 3.0–12.0)
Neutro Abs: 3 10*3/uL (ref 1.4–7.7)
Neutrophils Relative %: 56.2 % (ref 43.0–77.0)
Platelets: 280 10*3/uL (ref 150.0–400.0)
RBC: 4.64 Mil/uL (ref 3.87–5.11)
RDW: 14.2 % (ref 11.5–15.5)
WBC: 5.3 10*3/uL (ref 4.0–10.5)

## 2018-12-27 LAB — LIPID PANEL
Cholesterol: 203 mg/dL — ABNORMAL HIGH (ref 0–200)
HDL: 60.8 mg/dL (ref >39.00)
LDL Cholesterol: 127 mg/dL — ABNORMAL HIGH (ref 0–99)
NonHDL: 142.12
Total CHOL/HDL Ratio: 3
Triglycerides: 75 mg/dL (ref 0.0–149.0)
VLDL: 15 mg/dL (ref 0.0–40.0)

## 2018-12-27 NOTE — Progress Notes (Signed)
Subjective:   Dana Harding is a 68 y.o. female who presents for an Initial Medicare Annual Wellness Visit.  Review of Systems    N/A  Cardiac Risk Factors include: (P) advanced age (>75men, >37 women)     Objective:    Today's Vitals   12/27/18 0840 12/27/18 0916  BP: 122/78   Pulse: 67   Temp: 98.3 F (36.8 C)   TempSrc: Oral   SpO2: 97%   Weight: 162 lb 4 oz (73.6 kg)   Height: 5' 8.25" (1.734 m)   PainSc: 3  3   PainLoc: Back    Body mass index is 24.49 kg/m.  Advanced Directives 12/27/2018 02/14/2013  Does Patient Have a Medical Advance Directive? Yes Patient would not like information  Type of Advance Directive [place];Living will -  Copy of [place] in Chart? No - copy requested -    Current Medications (verified) Outpatient Encounter Medications as of 12/27/2018  Medication Sig  . ALPRAZolam (XANAX) 0.5 MG tablet Take 0.5-1 tablets (0.25-0.5 mg total) by mouth daily as needed for anxiety.  Marland Kitchen aspirin 81 MG tablet Take 81 mg by mouth daily.    Marland Kitchen azelastine (ASTELIN) 0.1 % nasal spray Place 1-2 sprays into both nostrils 2 (two) times daily as needed for rhinitis.  . B Complex Vitamins (VITAMIN B COMPLEX PO) Take 1 tablet by mouth daily.  Marland Kitchen BIOTIN PO Take 1 tablet by mouth daily.  . clidinium-chlordiazePOXIDE (LIBRAX) 5-2.5 MG capsule Take 1 capsule by mouth 3 (three) times daily. As needed.  . clotrimazole-betamethasone (LOTRISONE) cream Apply 1 application topically 2 (two) times daily.  Marland Kitchen co-enzyme Q-10 30 MG capsule Take 30 mg by mouth daily.    . Folic Acid POWD Take 419 mcg by mouth daily.   Marland Kitchen HYDROcodone-acetaminophen (NORCO/VICODIN) 5-325 MG tablet Take 1 tablet by mouth 3 (three) times daily as needed for moderate pain (sedation caution).  . Multiple Vitamins-Minerals (PRESERVISION AREDS 2 PO) Take by mouth.  . naproxen (NAPROSYN) 500 MG tablet Take 1 tablet (500 mg total) by mouth 2 (two) times daily with a meal.    . Omega-3 Fatty Acids (FISH OIL) 1000 MG CAPS Take 1 capsule by mouth daily.  Marland Kitchen omeprazole (PRILOSEC) 20 MG capsule Take 1 capsule (20 mg total) by mouth daily.  Marland Kitchen oxybutynin (DITROPAN-XL) 10 MG 24 hr tablet Take 1 tablet (10 mg total) by mouth daily.  . vitamin C (ASCORBIC ACID) 500 MG tablet Take 500 mg by mouth daily.    No facility-administered encounter medications on file as of 12/27/2018.     Allergies (verified) Latex   History: Past Medical History:  Diagnosis Date  . Anxiety   . Cataract 2020   correcting with surgery  . Dizziness and giddiness    intermittent  . Esophageal spasm   . 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 Family history of malignant neoplasm of breast   . Fitting and adjustment of gastric lap band   . IBS (irritable bowel syndrome)   . Inflammatory arthritis    H/o elevated ANA, treated with plaquenil w/o sig change in back pain  . Lumbar foraminal stenosis 02/09/07   mild; L4-5; per MRI  . Mitral regurgitation 03/23/07   mild  . Obesity   . Obstructive sleep apnea (adult) (pediatric)    prev used CPAP, resolved and off CPAP as of 2016  . Tricuspid regurgitation 03/23/07  mild to moderate   Past Surgical History:  Procedure Laterality Date  . BREAST REDUCTION SURGERY  2009  . CATARACT EXTRACTION W/ INTRAOCULAR LENS IMPLANT Right 12/11/2018  . CHOLECYSTECTOMY  1980's  . INGUINAL HERNIA REPAIR     bilateral  . LAPAROSCOPIC GASTRIC BANDING  2007  . LAPAROSCOPIC GASTRIC BANDING  2008   Realize  . SHOULDER ARTHROSCOPY WITH ROTATOR CUFF REPAIR AND SUBACROMIAL DECOMPRESSION Left 02/22/2013   Procedure: LEFT SHOULDER ARTHROSCOPY WITH SUBACROMIAL DECOMPRESSION DISTAL CLAVICLE RESECTION with ROTATOR CUFF REPAIR  ;  Surgeon: Marin Shutter, MD;  Location: [facility];  Service: Orthopedics;  Laterality: Left;  . TONSILLECTOMY AND ADENOIDECTOMY  1960  . tummy tuck  1996   Family History  Problem Relation Age of Onset   . Breast cancer Mother        < 75  . Stroke Father        < 82  . Bladder Cancer Sister   . Cancer Sister        Neuroendocrine tumor found incidentally in the small intestine  . Colon cancer Neg Hx    Social History   Socioeconomic History  . Marital status: Single    Spouse name: Not on file  . Number of children: 1  . Years of education: Not on file  . Highest education level: Not on file  Occupational History  . Not on file  Social Needs  . Financial resource strain: Not on file  . Food insecurity:    Worry: Not on file    Inability: Not on file  . Transportation needs:    Medical: Not on file    Non-medical: Not on file  Tobacco Use  . Smoking status: Former Smoker    Last attempt to quit: 12/06/1978    Years since quitting: 40.0  . Smokeless tobacco: Never Used  Substance and Sexual Activity  . Alcohol use: Yes    Comment: Occasional-weekend  . Drug use: No  . Sexual activity: Not on file  Lifestyle  . Physical activity:    Days per week: Not on file    Minutes per session: Not on file  . Stress: Not on file  Relationships  . Social connections:    Talks on phone: Not on file    Gets together: Not on file    Attends religious service: Not on file    Active member of club or organization: Not on file    Attends meetings of clubs or organizations: Not on file    Relationship status: Not on file  Other Topics Concern  . 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   Working a Sports coach firm part-time as of 2016    Tobacco Counseling Counseling given: No   Clinical Intake:  Pre-visit preparation completed: Yes  Pain Score: 3      Nutritional Status: BMI of 19-24  Normal Nutritional Risks: None Diabetes: No  How often do you need to have someone help you when you read instructions, pamphlets, or other written materials from your  doctor or pharmacy?: 1 - Never What is the last grade level you completed in school?: Master degree  Interpreter Needed?: No  Comments: pt is divorced Information entered by :: LPinson, LPN   Activities of Daily Living In your present state of health, do you have  any difficulty performing the following activities: 12/27/2018  Hearing? N  Vision? N  Difficulty concentrating or making decisions? N  Walking or climbing stairs? N  Dressing or bathing? N  Doing errands, shopping? N  Preparing Food and eating ? N  Using the Toilet? N  In the past six months, have you accidently leaked urine? N  Do you have problems with loss of bowel control? N  Managing your Medications? N  Managing your Finances? N  Housekeeping or managing your Housekeeping? N  Some recent data might be hidden     Immunizations and Health Maintenance Immunization History  Administered Date(s) Administered  . Influenza Split 10/17/2012  . Influenza,inj,Quad PF,6+ Mos 11/14/2014, 11/22/2017  . Influenza-Unspecified 09/06/2015  . Pneumococcal Polysaccharide-23 12/06/2008  . Td 12/07/2007  . Zoster 03/26/2014   There are no preventive care reminders to display for this patient.  Patient Care Team: Tonia Ghent, MD as PCP - General Dasher, Jeneen Rinks, MD (Surgery)  Indicate any recent Medical Services you may have received from other than [facility] providers in the past year (date may be approximate).     Assessment:   This is a routine wellness examination for [place].  Hearing/Vision screen  Hearing Screening   125Hz  250Hz  500Hz  1000Hz  2000Hz  3000Hz  4000Hz  6000Hz  8000Hz   Right ear:   40 40 40  40    Left ear:   40 40 40  40    Vision Screening Comments: Vision exam in Jan 2020  Dietary issues and exercise activities discussed: Current Exercise Habits: The patient does not participate in regular exercise at present, Exercise limited by: None identified  Goals    . Patient Stated     Starting 12/27/2018, I  will continue to take medications as prescribed.        Depression Screen PHQ 2/9 Scores 12/27/2018  PHQ - 2 Score 0  PHQ- 9 Score 0    Fall Risk Fall Risk  12/27/2018  Falls in the past year? 0   Cognitive Function: MMSE - Mini Mental State Exam 12/27/2018  Orientation to time 5  Orientation to Place 5  Registration 3  Attention/ Calculation 0  Recall 3  Language- name 2 objects 0  Language- repeat 1  Language- follow 3 step command 3  Language- read & follow direction 0  Write a sentence 0  Copy design 0  Total score 20   PLEASE NOTE: A Mini-Cog screen was completed. Maximum score is 20. A value of 0 denotes this part of Folstein MMSE was not completed or the patient failed this part of the Mini-Cog screening.   Mini-Cog Screening Orientation to Time - Max 5 pts Orientation to Place - Max 5 pts Registration - Max 3 pts Recall - Max 3 pts Language Repeat - Max 1 pts Language Follow 3 Step Command - Max 3 pts     Screening Tests Health Maintenance  Topic Date Due  . PNA vac Low Risk Adult (1 of 2 - PCV13) 01/02/2019 (Originally 11/13/2017)  . INFLUENZA VACCINE  03/06/2019 (Originally 07/06/2018)  . TETANUS/TDAP  12/05/2020 (Originally 12/06/2017)  . MAMMOGRAM  07/08/2019  . COLONOSCOPY  02/17/2023  . DEXA SCAN  Completed  . Hepatitis C Screening  Completed      Plan:     I have personally reviewed, addressed, and noted the following in the patient's chart:  A. Medical and social history B. Use of alcohol, tobacco or illicit drugs  C. Current medications and supplements D. Functional  ability and status E.  Nutritional status F.  Physical activity G. Advance directives H. List of other physicians I.  Hospitalizations, surgeries, and ER visits in previous 12 months J.  [place] to include hearing, vision, cognitive, depression L. Referrals and appointments - none  In addition, I have reviewed and discussed with patient certain preventive  protocols, quality metrics, and best practice recommendations. A written personalized care plan for preventive services as well as general preventive health recommendations were provided to patient.  See attached scanned questionnaire for additional information.   Signed,   Lindell Noe, MHA, BS, LPN Health Coach

## 2018-12-27 NOTE — Progress Notes (Signed)
   Subjective:    Patient ID: Dana Harding, female    DOB: [birth year], 68 y.o.   MRN: [id number]  HPI I reviewed health advisor's note, was available for consultation, and agree with documentation and plan.    Review of Systems     Objective:   Physical Exam         Assessment & Plan:

## 2018-12-27 NOTE — Progress Notes (Signed)
PCP notes:   Health maintenance:  Flu vaccine - postponed/surgery PNA vaccine - postponed/surgery Tetanus vaccine - postponed/insurance Hep C screening - completed  Abnormal screenings:   None  Patient concerns:   Right upper back pain - 3/10  Nurse concerns:  None  Next PCP appt:   01/02/19 @ 0845

## 2018-12-27 NOTE — Patient Instructions (Signed)
Dana Harding , Thank you for taking time to come for your Medicare Wellness Visit. I appreciate your ongoing commitment to your health goals. Please review the following plan we discussed and let me know if I can assist you in the future.   These are the goals we discussed: Goals    . Patient Stated     Starting 12/27/2018, I will continue to take medications as prescribed.         This is a list of the screening recommended for you and due dates:  Health Maintenance  Topic Date Due  . Pneumonia vaccines (1 of 2 - PCV13) 01/02/2019*  . Flu Shot  03/06/2019*  . Tetanus Vaccine  12/05/2020*  . Mammogram  07/08/2019  . Colon Cancer Screening  02/17/2023  . DEXA scan (bone density measurement)  Completed  .  Hepatitis C: One time screening is recommended by Center for Disease Control  (CDC) for  adults born from [birth year] through 1964.   Completed  *Topic was postponed. The date shown is not the original due date.   Preventive Care for Adults  A healthy lifestyle and preventive care can promote health and wellness. Preventive health guidelines for adults include the following key practices.  . A routine yearly physical is a good way to check with your health care provider about your health and preventive screening. It is a chance to share any concerns and updates on your health and to receive a thorough exam.  . Visit your dentist for a routine exam and preventive care every 6 months. Brush your teeth twice a day and floss once a day. Good oral hygiene prevents tooth decay and gum disease.  . The frequency of eye exams is based on your age, health, family medical history, use  of contact lenses, and other factors. Follow your health care provider's recommendations for frequency of eye exams.  . Eat a healthy diet. Foods like vegetables, fruits, whole grains, low-fat dairy products, and lean protein foods contain the nutrients you need without too many calories. Decrease your intake of foods  high in solid fats, added sugars, and salt. Eat the right amount of calories for you. Get information about a proper diet from your health care provider, if necessary.  . Regular physical exercise is one of the most important things you can do for your health. Most adults should get at least 150 minutes of moderate-intensity exercise (any activity that increases your heart rate and causes you to sweat) each week. In addition, most adults need muscle-strengthening exercises on 2 or more days a week.  Silver Sneakers may be a benefit available to you. To determine eligibility, you may visit the website: www.silversneakers.com or contact program at 939-215-4104 Mon-Fri between 8AM-8PM.   . Maintain a healthy weight. The body mass index (BMI) is a screening tool to identify possible weight problems. It provides an estimate of body fat based on height and weight. Your health care provider can find your BMI and can help you achieve or maintain a healthy weight.   For adults 20 years and older: ? A BMI below 18.5 is considered underweight. ? A BMI of 18.5 to 24.9 is normal. ? A BMI of 25 to 29.9 is considered overweight. ? A BMI of 30 and above is considered obese.   . Maintain normal blood lipids and cholesterol levels by exercising and minimizing your intake of saturated fat. Eat a balanced diet with plenty of fruit and vegetables. Blood tests for lipids  and cholesterol should begin at age 68 and be repeated every 5 years. If your lipid or cholesterol levels are high, you are over 50, or you are at high risk for heart disease, you may need your cholesterol levels checked more frequently. Ongoing high lipid and cholesterol levels should be treated with medicines if diet and exercise are not working.  . If you smoke, find out from your health care provider how to quit. If you do not use tobacco, please do not start.  . If you choose to drink alcohol, please do not consume more than 2 drinks per day.  One drink is considered to be 12 ounces (355 mL) of beer, 5 ounces (148 mL) of wine, or 1.5 ounces (44 mL) of liquor.  . If you are 60-25 years old, ask your health care provider if you should take aspirin to prevent strokes.  . Use sunscreen. Apply sunscreen liberally and repeatedly throughout the day. You should seek shade when your shadow is shorter than you. Protect yourself by wearing long sleeves, pants, a wide-brimmed hat, and sunglasses year round, whenever you are outdoors.  . Once a month, do a whole body skin exam, using a mirror to look at the skin on your back. Tell your health care provider of new moles, moles that have irregular borders, moles that are larger than a pencil eraser, or moles that have changed in shape or color.

## 2018-12-28 LAB — HEPATITIS C ANTIBODY
Hepatitis C Ab: NONREACTIVE
SIGNAL TO CUT-OFF: 0.04 (ref <1.00)

## 2018-12-28 LAB — VITAMIN B12: VITAMIN B 12: 788 pg/mL (ref 211–911)

## 2019-01-01 DIAGNOSIS — H25812 Combined forms of age-related cataract, left eye: Secondary | ICD-10-CM | POA: Diagnosis not present

## 2019-01-01 DIAGNOSIS — H2512 Age-related nuclear cataract, left eye: Secondary | ICD-10-CM | POA: Diagnosis not present

## 2019-01-01 HISTORY — PX: CATARACT EXTRACTION: SUR2

## 2019-01-02 ENCOUNTER — Encounter: Payer: Self-pay | Admitting: Family Medicine

## 2019-01-02 ENCOUNTER — Telehealth: Payer: Self-pay

## 2019-01-02 ENCOUNTER — Ambulatory Visit (INDEPENDENT_AMBULATORY_CARE_PROVIDER_SITE_OTHER): Payer: Medicare Other | Admitting: Family Medicine

## 2019-01-02 VITALS — BP 110/80 | HR 81 | Temp 98.7°F | Ht 68.25 in | Wt 162.2 lb

## 2019-01-02 DIAGNOSIS — M791 Myalgia, unspecified site: Secondary | ICD-10-CM

## 2019-01-02 DIAGNOSIS — Z0000 Encounter for general adult medical examination without abnormal findings: Secondary | ICD-10-CM

## 2019-01-02 DIAGNOSIS — R238 Other skin changes: Secondary | ICD-10-CM | POA: Diagnosis not present

## 2019-01-02 DIAGNOSIS — R350 Frequency of micturition: Secondary | ICD-10-CM | POA: Diagnosis not present

## 2019-01-02 DIAGNOSIS — F419 Anxiety disorder, unspecified: Secondary | ICD-10-CM

## 2019-01-02 DIAGNOSIS — M549 Dorsalgia, unspecified: Secondary | ICD-10-CM

## 2019-01-02 DIAGNOSIS — Z23 Encounter for immunization: Secondary | ICD-10-CM

## 2019-01-02 DIAGNOSIS — K589 Irritable bowel syndrome without diarrhea: Secondary | ICD-10-CM

## 2019-01-02 DIAGNOSIS — Z7189 Other specified counseling: Secondary | ICD-10-CM

## 2019-01-02 MED ORDER — HYDROCODONE-ACETAMINOPHEN 5-325 MG PO TABS: 1.0000 | ORAL_TABLET | Freq: Three times a day (TID) | ORAL | 0 refills | Status: DC | PRN

## 2019-01-02 MED ORDER — NAPROXEN 500 MG PO TABS: 500.0000 mg | ORAL_TABLET | Freq: Two times a day (BID) | ORAL | 2 refills | Status: DC

## 2019-01-02 MED ORDER — CLOBETASOL PROPIONATE 0.05 % EX SOLN: 1.0000 "application " | Freq: Two times a day (BID) | CUTANEOUS | 0 refills | Status: DC | PRN

## 2019-01-02 NOTE — Progress Notes (Signed)
Tetanus 2009 Shingles d/w pt.   Flu 2020 PNA 2020 Pap not due.   DXA 2019 per Dr. Radene Knee Mammogram 2019 Colonoscopy 2014 Living will- daughter designated if patient were incapacitated.  Diet and exercise d/w pt.  She is working on both.   HCV neg.    She has used clobetasol 0.05% soln for dry scalp.  Rare use.  The medication helped.  No adverse effect.  She had B cataract surgery.  Medical history updated.  Rare use of BZD.  No adverse effect on medication.  Occ use of librax for GI sx, with some relief.   No new symptoms.  Oxybutynin helped with bladder sx and she can tolerate some drying of her mouth.  Discussed with patient.  Rare/distant use of hydrocodone.  Has used naproxen occ for back pain.  Also with pain at the lower R shoulder blade.  Some pain sitting, twisting.  Going on for a few weeks.  No clear trauma.  No rash.  No bruising.  She has had similar in the past.  Heat clearly helps.    PMH and SH reviewed  Meds, vitals, and allergies reviewed.   ROS: Per HPI.  Unless specifically indicated otherwise in HPI, the patient denies:  General: fever. Eyes: acute vision changes ENT: sore throat Cardiovascular: chest pain Respiratory: SOB GI: vomiting GU: dysuria Musculoskeletal: acute back pain Derm: acute rash Neuro: acute motor dysfunction Psych: worsening mood Endocrine: polydipsia Heme: bleeding Allergy: hayfever  GEN: nad, alert and oriented HEENT: mucous membranes moist NECK: supple w/o LA CV: rrr. PULM: ctab, no inc wob ABD: soft, +bs EXT: no edema SKIN: no acute rash Back not tender in the midline but she has some tenderness of the muscles just inferior to the right scapula without bruising or rash.

## 2019-01-02 NOTE — Patient Instructions (Addendum)
The tetanus and new shingles shots may be cheaper at the pharmacy.   Keep using heat and check your desk position, etc.   Flu and PNA shot today.  Update me as needed.  Take care.  Glad to see you.

## 2019-01-02 NOTE — Telephone Encounter (Signed)
Pt is at [place] and was advised cannot get shingrix; I called [facility] [address] and was told do not have shingrix in at this time. Not sure when will get in new shipment. Pt has part D medicare and will need to get at a pharmacy or if gets at [facility] cost for 2 shingrix injections and admin between $400 - $500. Out of pocket. Pt voiced understanding and will call to other pharmacies to get shingrix vaccine. Nothing further needed.

## 2019-01-03 DIAGNOSIS — R238 Other skin changes: Secondary | ICD-10-CM | POA: Insufficient documentation

## 2019-01-03 DIAGNOSIS — M791 Myalgia, unspecified site: Secondary | ICD-10-CM | POA: Insufficient documentation

## 2019-01-03 DIAGNOSIS — Z0000 Encounter for general adult medical examination without abnormal findings: Secondary | ICD-10-CM | POA: Insufficient documentation

## 2019-01-03 NOTE — Assessment & Plan Note (Signed)
Occ use of librax for GI sx, with some relief.   Continue as is, with as needed use.

## 2019-01-03 NOTE — Assessment & Plan Note (Signed)
Rare use of BZD.  No adverse effect on medication.  Used as needed.

## 2019-01-03 NOTE — Assessment & Plan Note (Signed)
Oxybutynin helped with bladder sx and she can tolerate some drying of her mouth.  Discussed with patient.

## 2019-01-03 NOTE — Assessment & Plan Note (Signed)
Living will- daughter designated if patient were incapacitated.

## 2019-01-03 NOTE — Assessment & Plan Note (Signed)
Rare/distant use of hydrocodone.  Has used naproxen occ for back pain.   Continue as is.  Update me as needed.  She agrees.

## 2019-01-03 NOTE — Assessment & Plan Note (Addendum)
This could be exacerbated by positional stress, sitting at desk, using a mouse, etc.  She can check her desk ergonomics and use heat and stretching and update me as needed. >25 minutes spent in face to face time with patient, >50% spent in counselling or coordination of care.

## 2019-01-03 NOTE — Assessment & Plan Note (Signed)
She has used clobetasol 0.05% soln for dry scalp.  Rare use.  The medication helped.  No adverse effect.

## 2019-01-03 NOTE — Assessment & Plan Note (Signed)
Tetanus 2009 Shingles d/w pt.   Flu 2020 PNA 2020 Pap not due.   DXA 2019 per Dr. Radene Knee Mammogram 2019 Colonoscopy 2014 Living will- daughter designated if patient were incapacitated.  Diet and exercise d/w pt.  She is working on both.   HCV neg.

## 2019-01-26 ENCOUNTER — Other Ambulatory Visit: Payer: Self-pay | Admitting: Family Medicine

## 2019-01-30 ENCOUNTER — Encounter: Payer: Self-pay | Admitting: Family Medicine

## 2019-02-20 ENCOUNTER — Other Ambulatory Visit: Payer: Self-pay | Admitting: Family Medicine

## 2019-02-20 NOTE — Telephone Encounter (Signed)
Electronic refill request. Clobetasol Last office visit:   01/02/2019 Last Filled:    50 mL 0 01/02/2019  Please advise.

## 2019-02-21 NOTE — Telephone Encounter (Signed)
Sent. Thanks.   

## 2019-03-29 ENCOUNTER — Other Ambulatory Visit: Payer: Self-pay

## 2019-03-29 NOTE — Telephone Encounter (Signed)
Name of Medication: alprazolam 0.5 mg Name of Pharmacy: [facility] or Written Date and Quantity: # 30 on 11/22/17 Last Office Visit and Type: 01/02/19 annual Next Office Visit and Type:01/14/20 annual   [place] requesting refill alprazolam.

## 2019-03-30 MED ORDER — ALPRAZOLAM 0.5 MG PO TABS: 0.2500 mg | ORAL_TABLET | Freq: Every day | ORAL | 0 refills | Status: DC | PRN

## 2019-03-30 NOTE — Telephone Encounter (Signed)
Sent. Thanks.   

## 2019-03-30 NOTE — Telephone Encounter (Signed)
Patient advised.

## 2019-04-28 ENCOUNTER — Encounter: Payer: Self-pay | Admitting: Family Medicine

## 2019-05-02 ENCOUNTER — Telehealth: Payer: Self-pay | Admitting: Family Medicine

## 2019-05-02 DIAGNOSIS — R0789 Other chest pain: Secondary | ICD-10-CM

## 2019-05-02 NOTE — Telephone Encounter (Signed)
See my chart message that I sent to patient.  It sounds like she is having recurrent esophageal spasms.  I think it makes sense to get her set up with GI but please triage her in the meantime.  Please see if she can tolerate nitroglycerin at all.  That may be useful as an as needed medication for esophageal spasms.  Thanks.  I did not yet put in the referral, I can do that when I get the triage note.

## 2019-05-03 MED ORDER — NITROGLYCERIN 0.4 MG SL SUBL: 0.4000 mg | SUBLINGUAL_TABLET | SUBLINGUAL | 1 refills | Status: DC | PRN

## 2019-05-03 NOTE — Addendum Note (Signed)
Addended by: Tonia Ghent on: 05/03/2019 01:30 PM   Modules accepted: Orders

## 2019-05-03 NOTE — Telephone Encounter (Signed)
I spoke with pt; pt has never tried ntg before but is willing to try. [facility] [address] Pt said yesterday she had no pain at all; this morning earlier had short episode of CP. During the past 4 wks pt having daily mid CP,pain in back,pressure feeling in chest and pain radiates to rt jaw. Pt said been eval several times for heart issues and always determined spasms. On 05/02/19 no pain at all. This morning earlier short episode of CP. No CP now.pt said last EKG 3-4 yrs ago with rotator cuff surgery. No fever, chills,S/T,cough, SOB, muscle pain, diarrhea, H/A and no loss of taste or smell. Pt request cb after reviewed by Dr Damita Dunnings.Please advise.

## 2019-05-03 NOTE — Telephone Encounter (Signed)
I would use NTG if needed.  It can cause a headache and lightheadedness.  I sent the rx. It may help with the events when they happen but it wouldn't prevent them.   I think it makes sense to get her to see the GI clinic and I put in the referral.   I think it makes sense to get basic labs and a CXR and EKG done.  Can she some in for a visit to get this all done? Please schedule when possible.  I put in the orders for all of that except for the EKG, and that order can be placed at the [facility].    I would continue omeprazole but limit nsaids (aleve, iburpofen) in the meantime.   Thanks.

## 2019-05-03 NOTE — Telephone Encounter (Signed)
Best number [phone]  Pt returned call

## 2019-05-03 NOTE — Telephone Encounter (Signed)
Called and LMOVM that I had sent rx. Please call pt and tell her about the message below, please schedule.  Thanks.

## 2019-05-04 ENCOUNTER — Other Ambulatory Visit: Payer: Self-pay

## 2019-05-04 ENCOUNTER — Ambulatory Visit (INDEPENDENT_AMBULATORY_CARE_PROVIDER_SITE_OTHER)
Admission: RE | Admit: 2019-05-04 | Discharge: 2019-05-04 | Disposition: A | Discharge status: Home / Self Care | Payer: Medicare Other | Source: Ambulatory Visit | Attending: Family Medicine | Admitting: Family Medicine

## 2019-05-04 ENCOUNTER — Other Ambulatory Visit (INDEPENDENT_AMBULATORY_CARE_PROVIDER_SITE_OTHER): Payer: Medicare Other

## 2019-05-04 DIAGNOSIS — R0789 Other chest pain: Secondary | ICD-10-CM

## 2019-05-04 DIAGNOSIS — R079 Chest pain, unspecified: Secondary | ICD-10-CM | POA: Diagnosis not present

## 2019-05-04 LAB — COMPREHENSIVE METABOLIC PANEL
ALT: 16 U/L (ref 0–35)
AST: 24 U/L (ref 0–37)
Albumin: 4.2 g/dL (ref 3.5–5.2)
Alkaline Phosphatase: 86 U/L (ref 39–117)
BUN: 11 mg/dL (ref 6–23)
CO2: 29 mEq/L (ref 19–32)
Calcium: 10.1 mg/dL (ref 8.4–10.5)
Chloride: 105 mEq/L (ref 96–112)
Creatinine, Ser: 1.02 mg/dL (ref 0.40–1.20)
GFR: 65.51 mL/min (ref >60.00)
Glucose, Bld: 91 mg/dL (ref 70–99)
Potassium: 4.4 mEq/L (ref 3.5–5.1)
Sodium: 142 mEq/L (ref 135–145)
Total Bilirubin: 0.6 mg/dL (ref 0.2–1.2)
Total Protein: 7.3 g/dL (ref 6.0–8.3)

## 2019-05-04 LAB — CBC WITH DIFFERENTIAL/PLATELET
Basophils Absolute: 0 10*3/uL (ref 0.0–0.1)
Basophils Relative: 0.9 % (ref 0.0–3.0)
Eosinophils Absolute: 0.3 10*3/uL (ref 0.0–0.7)
Eosinophils Relative: 5.4 % — ABNORMAL HIGH (ref 0.0–5.0)
HCT: 39.7 % (ref 36.0–46.0)
Hemoglobin: 13.3 g/dL (ref 12.0–15.0)
Lymphocytes Relative: 41 % (ref 12.0–46.0)
Lymphs Abs: 2.1 10*3/uL (ref 0.7–4.0)
MCHC: 33.5 g/dL (ref 30.0–36.0)
MCV: 83.1 fl (ref 78.0–100.0)
Monocytes Absolute: 0.4 10*3/uL (ref 0.1–1.0)
Monocytes Relative: 7.5 % (ref 3.0–12.0)
Neutro Abs: 2.4 10*3/uL (ref 1.4–7.7)
Neutrophils Relative %: 45.2 % (ref 43.0–77.0)
Platelets: 319 10*3/uL (ref 150.0–400.0)
RBC: 4.78 Mil/uL (ref 3.87–5.11)
RDW: 14 % (ref 11.5–15.5)
WBC: 5.2 10*3/uL (ref 4.0–10.5)

## 2019-05-04 LAB — LIPASE: Lipase: 6 U/L — ABNORMAL LOW (ref 11.0–59.0)

## 2019-05-04 NOTE — Telephone Encounter (Signed)
Spoke to pt. She got his message and already came for labs this morning.

## 2019-05-04 NOTE — Telephone Encounter (Signed)
I spoke with pt and explained that Dr Damita Dunnings was requesting an EKG to be completed during an office visit but this msg was seen after Rosaria Ferries, the referral coordinator, called to get her GI referral scheduled and scheduled her labs. She was very gracious and said it was not a problem at all and she does not mind driving to see Dr Damita Dunnings. I scheduled her for Mon at 12:30pm.

## 2019-05-06 NOTE — Telephone Encounter (Signed)
Will see then. Thanks. 

## 2019-05-07 ENCOUNTER — Encounter: Payer: Self-pay | Admitting: Family Medicine

## 2019-05-07 ENCOUNTER — Other Ambulatory Visit: Payer: Self-pay

## 2019-05-07 ENCOUNTER — Ambulatory Visit (INDEPENDENT_AMBULATORY_CARE_PROVIDER_SITE_OTHER): Payer: Medicare Other | Admitting: Family Medicine

## 2019-05-07 VITALS — BP 124/82 | HR 69 | Temp 98.2°F | Ht 68.25 in | Wt 162.0 lb

## 2019-05-07 DIAGNOSIS — K224 Dyskinesia of esophagus: Secondary | ICD-10-CM

## 2019-05-07 DIAGNOSIS — R0789 Other chest pain: Secondary | ICD-10-CM

## 2019-05-07 NOTE — Progress Notes (Signed)
She was having recurrent sx that she thought were due to esophageal spasms.  She had prev used librax and would drink water and lay down.  It would resolve.  She would have episodic sx.    She was on vacation with family for about 2 months recently.  Then she had more sx recently, in the last two months.  She wasn't able to rest/relax and have the sx resolve.  She was putting up with it until she came back from the beach.    Since coming back from the beach, she has had dec in sx.  Her situation is better, ie her schedule is more relaxing.  Her diet was clearly different when she was at the beach for 2 months and that may have been a trigger.  She has NTG but hasn't had to use it yet.  She doesn't have exertional sx.  She can walk and exert now w/o troubles.  No blood in stool.  No vomiting blood.  Had vomited occ.    Prev labs CXR and EKG d/w pt. no acute changes on EKG.  When she had episodes, she had to spit out her secretions.  She didn't have wheeze or SOB.    She has GI f/u pending with Dr. Collene Mares.   Meds, vitals, and allergies reviewed.   ROS: Per HPI unless specifically indicated in ROS section   GEN: nad, alert and oriented HEENT: NCAT NECK: supple w/o LA CV: rrr PULM: ctab, no inc wob ABD: soft, +bs EXT: no edema SKIN: well perfused.

## 2019-05-07 NOTE — Patient Instructions (Addendum)
If needed, use the nitroglycerin.   I'll await the notes from Dr. Collene Mares.  Take care.  Glad to see you.   No charge for visit.

## 2019-05-09 DIAGNOSIS — K224 Dyskinesia of esophagus: Secondary | ICD-10-CM | POA: Insufficient documentation

## 2019-05-09 NOTE — Assessment & Plan Note (Signed)
Seems to been worse when her diet was different and she had a change in schedule when she was out of town.  Improved in the meantime.  She has nitroglycerin prescription but has not used it yet.  Previous results discussed with patient.  EKG without acute changes today.  She is going to follow-up with GI.  She will use nitroglycerin if needed.  Routine cautions given.  She agrees with plan.  I will await the GI notes.  No charge for visit as the plan was to get this all done with one visit and she had to come back for the EKG.  Discussed with patient.  Okay for outpatient follow-up.

## 2019-05-10 DIAGNOSIS — K219 Gastro-esophageal reflux disease without esophagitis: Secondary | ICD-10-CM | POA: Diagnosis not present

## 2019-05-10 DIAGNOSIS — K224 Dyskinesia of esophagus: Secondary | ICD-10-CM | POA: Diagnosis not present

## 2019-05-10 DIAGNOSIS — R1310 Dysphagia, unspecified: Secondary | ICD-10-CM | POA: Diagnosis not present

## 2019-06-13 DIAGNOSIS — R1310 Dysphagia, unspecified: Secondary | ICD-10-CM | POA: Diagnosis not present

## 2019-06-13 DIAGNOSIS — R1013 Epigastric pain: Secondary | ICD-10-CM | POA: Diagnosis not present

## 2019-06-13 DIAGNOSIS — K219 Gastro-esophageal reflux disease without esophagitis: Secondary | ICD-10-CM | POA: Diagnosis not present

## 2019-06-14 ENCOUNTER — Other Ambulatory Visit: Payer: Self-pay | Admitting: Gastroenterology

## 2019-06-14 DIAGNOSIS — R198 Other specified symptoms and signs involving the digestive system and abdomen: Secondary | ICD-10-CM

## 2019-06-14 DIAGNOSIS — R1013 Epigastric pain: Secondary | ICD-10-CM

## 2019-06-18 ENCOUNTER — Ambulatory Visit
Admission: RE | Admit: 2019-06-18 | Discharge: 2019-06-18 | Disposition: A | Discharge status: Home / Self Care | Payer: Medicare Other | Source: Ambulatory Visit | Attending: Gastroenterology | Admitting: Gastroenterology

## 2019-06-18 ENCOUNTER — Other Ambulatory Visit: Payer: Self-pay | Admitting: Gastroenterology

## 2019-06-18 DIAGNOSIS — K224 Dyskinesia of esophagus: Secondary | ICD-10-CM | POA: Diagnosis not present

## 2019-06-18 DIAGNOSIS — R1013 Epigastric pain: Secondary | ICD-10-CM

## 2019-06-18 DIAGNOSIS — R198 Other specified symptoms and signs involving the digestive system and abdomen: Secondary | ICD-10-CM

## 2019-06-27 DIAGNOSIS — Z4651 Encounter for fitting and adjustment of gastric lap band: Secondary | ICD-10-CM | POA: Diagnosis not present

## 2019-07-20 DIAGNOSIS — Z1231 Encounter for screening mammogram for malignant neoplasm of breast: Secondary | ICD-10-CM | POA: Diagnosis not present

## 2019-07-20 DIAGNOSIS — Z803 Family history of malignant neoplasm of breast: Secondary | ICD-10-CM | POA: Diagnosis not present

## 2019-07-20 LAB — HM MAMMOGRAPHY

## 2019-08-01 DIAGNOSIS — Z4651 Encounter for fitting and adjustment of gastric lap band: Secondary | ICD-10-CM | POA: Diagnosis not present

## 2019-08-01 DIAGNOSIS — K224 Dyskinesia of esophagus: Secondary | ICD-10-CM | POA: Diagnosis not present

## 2019-08-09 ENCOUNTER — Ambulatory Visit (INDEPENDENT_AMBULATORY_CARE_PROVIDER_SITE_OTHER): Payer: Medicare Other | Admitting: Family Medicine

## 2019-08-09 ENCOUNTER — Other Ambulatory Visit: Payer: Self-pay

## 2019-08-09 ENCOUNTER — Encounter: Payer: Self-pay | Admitting: Family Medicine

## 2019-08-09 VITALS — BP 146/90 | HR 74 | Temp 98.2°F | Ht 68.25 in | Wt 165.6 lb

## 2019-08-09 DIAGNOSIS — R42 Dizziness and giddiness: Secondary | ICD-10-CM

## 2019-08-09 DIAGNOSIS — Z23 Encounter for immunization: Secondary | ICD-10-CM

## 2019-08-09 MED ORDER — MECLIZINE HCL 25 MG PO TABS: 12.5000 mg | ORAL_TABLET | Freq: Three times a day (TID) | ORAL | 0 refills | Status: DC | PRN

## 2019-08-09 NOTE — Patient Instructions (Signed)
Flu shot today.  Use meclizine if needed.  Let me see about options in the meantime.  Take care.  Glad to see you.

## 2019-08-09 NOTE — Progress Notes (Signed)
Ditropan helps with urinary symptoms but caffeine blunts the effect, d/w pt.    A few years ago she had h/o vertigo.  She had ENT eval and Epley maneuver.  She did well for a long period of time.  In the last 3 weeks she has episodes, not constant. Night before last she had gotten up and had clearly worse sx.  "the whole room was spinning" with quick onset.  No syncope.    She didn't fall, she was able to sit down.  Nauseated at the time.  Didn't vomit.  Was able to sit for a few minutes and then sx gradually got better.  Then she was a little lightheaded after the event.  She isn't queasy today.  She doesn't feel orthostatic on standing but she doesn't feel fully back to baseline.   She is clearly better than the worst episode of symptoms recently.  Meds, vitals, and allergies reviewed.   ROS: Per HPI unless specifically indicated in ROS section   nad ncat TM wnl EOMI Neck supple, no LA rrr ctab abd soft, not ttp CN 2-12 wnl B, S/S/DTR wnl x4 DHP neg B

## 2019-08-10 ENCOUNTER — Telehealth: Payer: Self-pay | Admitting: Family Medicine

## 2019-08-10 NOTE — Telephone Encounter (Signed)
Please call patient.  I have been considering her situation.  If she has recurrent symptoms then let me know and we can set up MRI of her brain.  If she has complete resolution of symptoms then I do not think she has to go for any imaging.  Thanks.

## 2019-08-10 NOTE — Telephone Encounter (Signed)
No answer, no VM, will try again later.

## 2019-08-10 NOTE — Telephone Encounter (Signed)
Patient advised.

## 2019-08-13 NOTE — Assessment & Plan Note (Signed)
Discussed options.  Likely BPV.  Negative Dix-Hallpike testing at this point.  Benign neurologic exam.  Okay to hold meclizine for now, use if needed. If she has recurrent symptoms then she can let me know and we can set up MRI of her brain.  If she has complete resolution of symptoms then I do not think she has to go for any imaging.

## 2019-09-05 ENCOUNTER — Other Ambulatory Visit: Payer: Self-pay | Admitting: Family Medicine

## 2019-09-05 DIAGNOSIS — Z4651 Encounter for fitting and adjustment of gastric lap band: Secondary | ICD-10-CM | POA: Diagnosis not present

## 2019-09-05 NOTE — Telephone Encounter (Signed)
Sent. Thanks.   

## 2019-09-05 NOTE — Telephone Encounter (Signed)
Electronic refill request Librax Last office visit 08/09/19 Last refill 08/01/18 #60/3

## 2019-09-07 ENCOUNTER — Encounter: Payer: Self-pay | Admitting: Radiology

## 2019-09-17 DIAGNOSIS — L820 Inflamed seborrheic keratosis: Secondary | ICD-10-CM | POA: Diagnosis not present

## 2019-09-17 DIAGNOSIS — L821 Other seborrheic keratosis: Secondary | ICD-10-CM | POA: Diagnosis not present

## 2019-09-17 DIAGNOSIS — L815 Leukoderma, not elsewhere classified: Secondary | ICD-10-CM | POA: Diagnosis not present

## 2019-09-17 DIAGNOSIS — L578 Other skin changes due to chronic exposure to nonionizing radiation: Secondary | ICD-10-CM | POA: Diagnosis not present

## 2019-10-01 ENCOUNTER — Other Ambulatory Visit: Payer: Self-pay | Admitting: Family Medicine

## 2019-10-08 DIAGNOSIS — M25531 Pain in right wrist: Secondary | ICD-10-CM | POA: Diagnosis not present

## 2019-10-30 DIAGNOSIS — M25531 Pain in right wrist: Secondary | ICD-10-CM | POA: Diagnosis not present

## 2019-11-07 DIAGNOSIS — M25531 Pain in right wrist: Secondary | ICD-10-CM | POA: Diagnosis not present

## 2019-11-13 DIAGNOSIS — H02832 Dermatochalasis of right lower eyelid: Secondary | ICD-10-CM | POA: Diagnosis not present

## 2019-11-13 DIAGNOSIS — Z20828 Contact with and (suspected) exposure to other viral communicable diseases: Secondary | ICD-10-CM | POA: Diagnosis not present

## 2019-11-13 DIAGNOSIS — H02835 Dermatochalasis of left lower eyelid: Secondary | ICD-10-CM | POA: Diagnosis not present

## 2019-11-24 ENCOUNTER — Other Ambulatory Visit: Payer: Self-pay | Admitting: Family Medicine

## 2019-12-13 DIAGNOSIS — M25531 Pain in right wrist: Secondary | ICD-10-CM | POA: Diagnosis not present

## 2020-01-05 DIAGNOSIS — H02832 Dermatochalasis of right lower eyelid: Secondary | ICD-10-CM | POA: Diagnosis not present

## 2020-01-05 DIAGNOSIS — Z20822 Contact with and (suspected) exposure to covid-19: Secondary | ICD-10-CM | POA: Diagnosis not present

## 2020-01-05 DIAGNOSIS — H02835 Dermatochalasis of left lower eyelid: Secondary | ICD-10-CM | POA: Diagnosis not present

## 2020-01-06 DIAGNOSIS — Z23 Encounter for immunization: Secondary | ICD-10-CM | POA: Diagnosis not present

## 2020-01-07 ENCOUNTER — Ambulatory Visit: Payer: Medicare Other

## 2020-01-14 ENCOUNTER — Other Ambulatory Visit: Payer: Self-pay

## 2020-01-14 ENCOUNTER — Encounter: Payer: Self-pay | Admitting: Family Medicine

## 2020-01-14 ENCOUNTER — Telehealth: Payer: Self-pay

## 2020-01-14 ENCOUNTER — Ambulatory Visit (INDEPENDENT_AMBULATORY_CARE_PROVIDER_SITE_OTHER): Payer: Medicare Other | Admitting: Family Medicine

## 2020-01-14 VITALS — BP 128/74 | HR 75 | Temp 96.3°F | Ht 68.25 in | Wt 174.6 lb

## 2020-01-14 DIAGNOSIS — Z0000 Encounter for general adult medical examination without abnormal findings: Secondary | ICD-10-CM | POA: Diagnosis not present

## 2020-01-14 DIAGNOSIS — M549 Dorsalgia, unspecified: Secondary | ICD-10-CM

## 2020-01-14 DIAGNOSIS — Z7189 Other specified counseling: Secondary | ICD-10-CM

## 2020-01-14 DIAGNOSIS — Z9884 Bariatric surgery status: Secondary | ICD-10-CM

## 2020-01-14 DIAGNOSIS — J310 Chronic rhinitis: Secondary | ICD-10-CM

## 2020-01-14 DIAGNOSIS — E785 Hyperlipidemia, unspecified: Secondary | ICD-10-CM

## 2020-01-14 LAB — CBC WITH DIFFERENTIAL/PLATELET
Basophils Absolute: 0.1 10*3/uL (ref 0.0–0.1)
Basophils Relative: 1.1 % (ref 0.0–3.0)
Eosinophils Absolute: 0.2 10*3/uL (ref 0.0–0.7)
Eosinophils Relative: 3.4 % (ref 0.0–5.0)
HCT: 41.6 % (ref 36.0–46.0)
Hemoglobin: 13.7 g/dL (ref 12.0–15.0)
Lymphocytes Relative: 34.9 % (ref 12.0–46.0)
Lymphs Abs: 2.2 10*3/uL (ref 0.7–4.0)
MCHC: 32.9 g/dL (ref 30.0–36.0)
MCV: 86.3 fl (ref 78.0–100.0)
Monocytes Absolute: 0.4 10*3/uL (ref 0.1–1.0)
Monocytes Relative: 7 % (ref 3.0–12.0)
Neutro Abs: 3.3 10*3/uL (ref 1.4–7.7)
Neutrophils Relative %: 53.6 % (ref 43.0–77.0)
Platelets: 331 10*3/uL (ref 150.0–400.0)
RBC: 4.82 Mil/uL (ref 3.87–5.11)
RDW: 14 % (ref 11.5–15.5)
WBC: 6.2 10*3/uL (ref 4.0–10.5)

## 2020-01-14 LAB — COMPREHENSIVE METABOLIC PANEL
ALT: 12 U/L (ref 0–35)
AST: 17 U/L (ref 0–37)
Albumin: 4.4 g/dL (ref 3.5–5.2)
Alkaline Phosphatase: 83 U/L (ref 39–117)
BUN: 17 mg/dL (ref 6–23)
CO2: 30 mEq/L (ref 19–32)
Calcium: 9.9 mg/dL (ref 8.4–10.5)
Chloride: 104 mEq/L (ref 96–112)
Creatinine, Ser: 0.97 mg/dL (ref 0.40–1.20)
GFR: 69.28 mL/min (ref >60.00)
Glucose, Bld: 99 mg/dL (ref 70–99)
Potassium: 4.3 mEq/L (ref 3.5–5.1)
Sodium: 140 mEq/L (ref 135–145)
Total Bilirubin: 0.6 mg/dL (ref 0.2–1.2)
Total Protein: 7.6 g/dL (ref 6.0–8.3)

## 2020-01-14 LAB — LIPID PANEL
Cholesterol: 262 mg/dL — ABNORMAL HIGH (ref 0–200)
HDL: 84.9 mg/dL (ref >39.00)
LDL Cholesterol: 163 mg/dL — ABNORMAL HIGH (ref 0–99)
NonHDL: 177.25
Total CHOL/HDL Ratio: 3
Triglycerides: 72 mg/dL (ref 0.0–149.0)
VLDL: 14.4 mg/dL (ref 0.0–40.0)

## 2020-01-14 LAB — TSH: TSH: 1.12 u[IU]/mL (ref 0.35–4.50)

## 2020-01-14 MED ORDER — AZELASTINE HCL 0.1 % NA SOLN: 1.0000 | Freq: Two times a day (BID) | NASAL | 12 refills | Status: DC | PRN

## 2020-01-14 NOTE — Telephone Encounter (Signed)
[facility] Night - Client Nonclinical Telephone Record AccessNurse Client [name] - Client Client Site [facility] Physician Renford Dills - MD Contact Type Call Who Is Calling Patient / Member / Family / Caregiver Caller Name [place] Phone Number declined Patient Name Dana Harding Patient DOB 06/05/1951 Call Type Message Only Information Provided Reason for Call Request for General Office Information Initial Comment Caller states she is outside waiting on her appt at 8. Spoke with Shirlean Mylar, and she said for pt to go on in Additional Comment Disp. Time Disposition Final User 01/14/2020 7:57:34 AM General Information Provided Yes [place], Lori Call Closed By: Jearld Shines Transaction Date/Time: 01/14/2020 7:54:13 AM (ET)

## 2020-01-14 NOTE — Progress Notes (Signed)
This visit occurred during the SARS-CoV-2 public health emergency.  Safety protocols were in place, including screening questions prior to the visit, additional usage of staff PPE, and extensive cleaning of exam room while observing appropriate contact time as indicated for disinfecting solutions.  I have personally reviewed the Medicare Annual Wellness questionnaire and have noted 1. The patient's medical and social history 2. Their use of alcohol, tobacco or illicit drugs 3. Their current medications and supplements 4. The patient's functional ability including ADL's, fall risks, home safety risks and hearing or visual             impairment. 5. Diet and physical activities 6. Evidence for depression or mood disorders  The patients weight, height, BMI have been recorded in the chart and visual acuity is per eye clinic.  I have made referrals, counseling and provided education to the patient based review of the above and I have provided the pt with a written personalized care plan for preventive services.  Provider list updated- see scanned forms.  Routine anticipatory guidance given to patient.  See health maintenance. The possibility exists that previously documented standard health maintenance information may have been brought forward from a previous encounter into this note.  If needed, that same information has been updated to reflect the current situation based on today's encounter.    Flu 2020 Shingles up-to-date PNA up-to-date Tetanus 2020 Colon cancer screening 2014. Breast cancer screening 2020 Bone density test 2019 Advance directive-daughter designated patient were incapacitated. Cognitive function addressed- see scanned forms- and if abnormal then additional documentation follows.   1st covid vaccine done with f/u pending, d/w pt.    She had f/u with Dr. Hassell Done about her lap band.  That helped in the meantime.  She is having less spasms now.  No blood in stool.    She had  eyelid surgery done.  She is going to have gum laser surgery in the near future.    She has occ flare of back pain.  Pain meds used prn.  No adverse effect on medication.  She is dealing with rhinitis for 2-3 months, she thought this was seasonal.  She has used astelin, with benadryl used at night.  Those help.    PMH and SH reviewed  Meds, vitals, and allergies reviewed.   ROS: Per HPI.  Unless specifically indicated otherwise in HPI, the patient denies:  General: fever. Eyes: acute vision changes ENT: sore throat Cardiovascular: chest pain Respiratory: SOB GI: vomiting GU: dysuria Musculoskeletal: acute back pain Derm: acute rash Neuro: acute motor dysfunction Psych: worsening mood Endocrine: polydipsia Heme: bleeding Allergy: hayfever  GEN: nad, alert and oriented HEENT: ncat NECK: supple w/o LA CV: rrr. PULM: ctab, no inc wob ABD: soft, +bs EXT: no edema SKIN: no acute rash  Health Maintenance  Topic Date Due  . PNA vac Low Risk Adult (2 of 2 - PCV13) 01/03/2020  . MAMMOGRAM  07/19/2020  . COLONOSCOPY  02/17/2023  . TETANUS/TDAP  01/02/2029  . INFLUENZA VACCINE  Completed  . DEXA SCAN  Completed  . Hepatitis C Screening  Completed

## 2020-01-14 NOTE — Patient Instructions (Signed)
Go to the lab on the way out.   If you have mychart we'll likely use that to update you.    °Take care.  Glad to see you. °Update me as needed.   °

## 2020-01-14 NOTE — Telephone Encounter (Signed)
Per pts chart review pt has already been seen; nothiing further needed.

## 2020-01-15 ENCOUNTER — Telehealth: Payer: Self-pay | Admitting: Family Medicine

## 2020-01-15 NOTE — Telephone Encounter (Signed)
error 

## 2020-01-16 ENCOUNTER — Telehealth: Payer: Self-pay | Admitting: *Deleted

## 2020-01-16 MED ORDER — DICYCLOMINE HCL 10 MG PO CAPS: 10.0000 mg | ORAL_CAPSULE | Freq: Four times a day (QID) | ORAL | 1 refills | Status: DC | PRN

## 2020-01-16 NOTE — Telephone Encounter (Signed)
[facility] stating Librax 5-2.5 mg has been on backorder for a few months now.  Please consider and alternative.  Please advise.

## 2020-01-16 NOTE — Telephone Encounter (Signed)
Notify patient.  Reasonable to try Bentyl instead.  Prescription sent.  Please update me as needed.  Thanks.

## 2020-01-17 NOTE — Telephone Encounter (Signed)
Ms. Dana Harding notified as instructed by telephone.  Patient states understanding.

## 2020-01-18 ENCOUNTER — Other Ambulatory Visit: Payer: Self-pay | Admitting: Family Medicine

## 2020-01-18 DIAGNOSIS — J310 Chronic rhinitis: Secondary | ICD-10-CM | POA: Insufficient documentation

## 2020-01-18 DIAGNOSIS — E785 Hyperlipidemia, unspecified: Secondary | ICD-10-CM

## 2020-01-18 NOTE — Assessment & Plan Note (Signed)
Advance directive-daughter designated patient were incapacitated.

## 2020-01-18 NOTE — Assessment & Plan Note (Signed)
She is dealing with rhinitis for 2-3 months, she thought this was seasonal.  She has used astelin, with benadryl used at night.  Those help.   Continue as is.  She agrees.

## 2020-01-18 NOTE — Assessment & Plan Note (Signed)
Flu 2020 Shingles up-to-date PNA up-to-date Tetanus 2020 Colon cancer screening 2014. Breast cancer screening 2020 Bone density test 2019 Advance directive-daughter designated patient were incapacitated. Cognitive function addressed- see scanned forms- and if abnormal then additional documentation follows.   1st covid vaccine done with f/u pending, d/w pt.

## 2020-01-18 NOTE — Assessment & Plan Note (Signed)
She has occ flare of back pain.  Pain meds used prn.  No adverse effect on medication.

## 2020-01-18 NOTE — Assessment & Plan Note (Signed)
She had f/u with Dr. Hassell Done about her lap band.  That helped in the meantime.  She is having less spasms now.  No blood in stool.

## 2020-01-28 DIAGNOSIS — Z23 Encounter for immunization: Secondary | ICD-10-CM | POA: Diagnosis not present

## 2020-02-05 ENCOUNTER — Ambulatory Visit: Payer: Medicare Other | Attending: Internal Medicine

## 2020-02-05 DIAGNOSIS — Z20822 Contact with and (suspected) exposure to covid-19: Secondary | ICD-10-CM

## 2020-02-06 LAB — NOVEL CORONAVIRUS, NAA: SARS-CoV-2, NAA: NOT DETECTED

## 2020-02-27 DIAGNOSIS — H5211 Myopia, right eye: Secondary | ICD-10-CM | POA: Diagnosis not present

## 2020-02-27 DIAGNOSIS — H26491 Other secondary cataract, right eye: Secondary | ICD-10-CM | POA: Diagnosis not present

## 2020-02-27 DIAGNOSIS — H5202 Hypermetropia, left eye: Secondary | ICD-10-CM | POA: Diagnosis not present

## 2020-02-27 DIAGNOSIS — Z961 Presence of intraocular lens: Secondary | ICD-10-CM | POA: Diagnosis not present

## 2020-02-27 DIAGNOSIS — H353132 Nonexudative age-related macular degeneration, bilateral, intermediate dry stage: Secondary | ICD-10-CM | POA: Diagnosis not present

## 2020-02-27 DIAGNOSIS — H524 Presbyopia: Secondary | ICD-10-CM | POA: Diagnosis not present

## 2020-02-27 DIAGNOSIS — H04123 Dry eye syndrome of bilateral lacrimal glands: Secondary | ICD-10-CM | POA: Diagnosis not present

## 2020-03-19 DIAGNOSIS — Z4651 Encounter for fitting and adjustment of gastric lap band: Secondary | ICD-10-CM | POA: Diagnosis not present

## 2020-03-24 ENCOUNTER — Other Ambulatory Visit: Payer: Self-pay | Admitting: Surgery

## 2020-03-24 DIAGNOSIS — K224 Dyskinesia of esophagus: Secondary | ICD-10-CM

## 2020-03-31 ENCOUNTER — Other Ambulatory Visit: Payer: Self-pay

## 2020-03-31 ENCOUNTER — Ambulatory Visit
Admission: RE | Admit: 2020-03-31 | Discharge: 2020-03-31 | Disposition: A | Discharge status: Home / Self Care | Payer: Medicare Other | Source: Ambulatory Visit | Attending: Surgery | Admitting: Surgery

## 2020-03-31 ENCOUNTER — Other Ambulatory Visit: Payer: Self-pay | Admitting: Surgery

## 2020-03-31 DIAGNOSIS — K224 Dyskinesia of esophagus: Secondary | ICD-10-CM

## 2020-04-16 DIAGNOSIS — K9509 Other complications of gastric band procedure: Secondary | ICD-10-CM | POA: Diagnosis not present

## 2020-06-03 SURGERY — Surgical Case
Anesthesia: *Unknown

## 2020-06-11 ENCOUNTER — Other Ambulatory Visit (HOSPITAL_COMMUNITY): Payer: Medicare Other

## 2020-06-12 ENCOUNTER — Other Ambulatory Visit (HOSPITAL_COMMUNITY): Payer: Medicare Other

## 2020-06-16 ENCOUNTER — Inpatient Hospital Stay: Admit: 2020-06-16 | Payer: Medicare Other | Admitting: Surgery

## 2020-06-16 SURGERY — REMOVAL, GASTRIC BAND, LAPAROSCOPIC
Anesthesia: General

## 2020-06-17 DIAGNOSIS — K219 Gastro-esophageal reflux disease without esophagitis: Secondary | ICD-10-CM | POA: Diagnosis not present

## 2020-06-17 DIAGNOSIS — M899 Disorder of bone, unspecified: Secondary | ICD-10-CM | POA: Diagnosis not present

## 2020-06-17 DIAGNOSIS — R2689 Other abnormalities of gait and mobility: Secondary | ICD-10-CM | POA: Diagnosis not present

## 2020-06-17 DIAGNOSIS — R799 Abnormal finding of blood chemistry, unspecified: Secondary | ICD-10-CM | POA: Diagnosis not present

## 2020-06-17 DIAGNOSIS — Z9884 Bariatric surgery status: Secondary | ICD-10-CM | POA: Diagnosis not present

## 2020-06-17 DIAGNOSIS — R1310 Dysphagia, unspecified: Secondary | ICD-10-CM | POA: Diagnosis not present

## 2020-06-18 DIAGNOSIS — K219 Gastro-esophageal reflux disease without esophagitis: Secondary | ICD-10-CM | POA: Diagnosis not present

## 2020-06-18 DIAGNOSIS — Z9884 Bariatric surgery status: Secondary | ICD-10-CM | POA: Diagnosis not present

## 2020-06-18 DIAGNOSIS — R1310 Dysphagia, unspecified: Secondary | ICD-10-CM | POA: Diagnosis not present

## 2020-06-23 DIAGNOSIS — Z6828 Body mass index (BMI) 28.0-28.9, adult: Secondary | ICD-10-CM | POA: Diagnosis not present

## 2020-06-23 DIAGNOSIS — Z9884 Bariatric surgery status: Secondary | ICD-10-CM | POA: Diagnosis not present

## 2020-06-23 DIAGNOSIS — K9509 Other complications of gastric band procedure: Secondary | ICD-10-CM | POA: Diagnosis not present

## 2020-06-23 DIAGNOSIS — R1310 Dysphagia, unspecified: Secondary | ICD-10-CM | POA: Diagnosis not present

## 2020-06-23 DIAGNOSIS — K222 Esophageal obstruction: Secondary | ICD-10-CM | POA: Diagnosis present

## 2020-06-23 DIAGNOSIS — K3189 Other diseases of stomach and duodenum: Secondary | ICD-10-CM | POA: Diagnosis not present

## 2020-06-23 DIAGNOSIS — Z87891 Personal history of nicotine dependence: Secondary | ICD-10-CM | POA: Diagnosis not present

## 2020-06-23 DIAGNOSIS — Z91040 Latex allergy status: Secondary | ICD-10-CM | POA: Diagnosis not present

## 2020-06-23 DIAGNOSIS — E8881 Metabolic syndrome: Secondary | ICD-10-CM | POA: Diagnosis not present

## 2020-06-23 DIAGNOSIS — F419 Anxiety disorder, unspecified: Secondary | ICD-10-CM | POA: Diagnosis present

## 2020-06-23 DIAGNOSIS — Z4651 Encounter for fitting and adjustment of gastric lap band: Secondary | ICD-10-CM | POA: Diagnosis not present

## 2020-06-23 DIAGNOSIS — Z79899 Other long term (current) drug therapy: Secondary | ICD-10-CM | POA: Diagnosis not present

## 2020-06-23 DIAGNOSIS — R1319 Other dysphagia: Secondary | ICD-10-CM | POA: Diagnosis not present

## 2020-06-23 DIAGNOSIS — K219 Gastro-esophageal reflux disease without esophagitis: Secondary | ICD-10-CM | POA: Diagnosis not present

## 2020-06-30 DIAGNOSIS — Z4802 Encounter for removal of sutures: Secondary | ICD-10-CM | POA: Diagnosis not present

## 2020-07-03 DIAGNOSIS — K224 Dyskinesia of esophagus: Secondary | ICD-10-CM | POA: Diagnosis not present

## 2020-07-03 DIAGNOSIS — K5904 Chronic idiopathic constipation: Secondary | ICD-10-CM | POA: Diagnosis not present

## 2020-07-03 DIAGNOSIS — K219 Gastro-esophageal reflux disease without esophagitis: Secondary | ICD-10-CM | POA: Diagnosis not present

## 2020-07-03 DIAGNOSIS — R1310 Dysphagia, unspecified: Secondary | ICD-10-CM | POA: Diagnosis not present

## 2020-07-04 ENCOUNTER — Other Ambulatory Visit: Payer: Self-pay | Admitting: Gastroenterology

## 2020-07-04 DIAGNOSIS — R1310 Dysphagia, unspecified: Secondary | ICD-10-CM

## 2020-07-07 ENCOUNTER — Other Ambulatory Visit: Payer: Medicare Other

## 2020-07-07 DIAGNOSIS — Z20822 Contact with and (suspected) exposure to covid-19: Secondary | ICD-10-CM | POA: Diagnosis not present

## 2020-07-18 ENCOUNTER — Ambulatory Visit
Admission: RE | Admit: 2020-07-18 | Discharge: 2020-07-18 | Disposition: A | Discharge status: Home / Self Care | Payer: Medicare Other | Source: Ambulatory Visit | Attending: Gastroenterology | Admitting: Gastroenterology

## 2020-07-18 DIAGNOSIS — K224 Dyskinesia of esophagus: Secondary | ICD-10-CM | POA: Diagnosis not present

## 2020-07-18 DIAGNOSIS — R1310 Dysphagia, unspecified: Secondary | ICD-10-CM

## 2020-07-21 ENCOUNTER — Encounter: Payer: Self-pay | Admitting: Family Medicine

## 2020-07-21 DIAGNOSIS — K224 Dyskinesia of esophagus: Secondary | ICD-10-CM | POA: Insufficient documentation

## 2020-07-23 DIAGNOSIS — R1310 Dysphagia, unspecified: Secondary | ICD-10-CM | POA: Diagnosis not present

## 2020-07-23 DIAGNOSIS — Z9884 Bariatric surgery status: Secondary | ICD-10-CM | POA: Diagnosis not present

## 2020-07-23 DIAGNOSIS — K219 Gastro-esophageal reflux disease without esophagitis: Secondary | ICD-10-CM | POA: Diagnosis not present

## 2020-07-25 DIAGNOSIS — Z1231 Encounter for screening mammogram for malignant neoplasm of breast: Secondary | ICD-10-CM | POA: Diagnosis not present

## 2020-07-25 DIAGNOSIS — Z20828 Contact with and (suspected) exposure to other viral communicable diseases: Secondary | ICD-10-CM | POA: Diagnosis not present

## 2020-07-25 LAB — HM MAMMOGRAPHY

## 2020-07-31 DIAGNOSIS — R1310 Dysphagia, unspecified: Secondary | ICD-10-CM | POA: Diagnosis not present

## 2020-07-31 DIAGNOSIS — Z9884 Bariatric surgery status: Secondary | ICD-10-CM | POA: Diagnosis not present

## 2020-07-31 DIAGNOSIS — K219 Gastro-esophageal reflux disease without esophagitis: Secondary | ICD-10-CM | POA: Diagnosis not present

## 2020-08-05 ENCOUNTER — Encounter: Payer: Self-pay | Admitting: Family Medicine

## 2020-08-13 DIAGNOSIS — K219 Gastro-esophageal reflux disease without esophagitis: Secondary | ICD-10-CM | POA: Diagnosis not present

## 2020-08-13 DIAGNOSIS — R1310 Dysphagia, unspecified: Secondary | ICD-10-CM | POA: Diagnosis not present

## 2020-08-13 DIAGNOSIS — R933 Abnormal findings on diagnostic imaging of other parts of digestive tract: Secondary | ICD-10-CM | POA: Diagnosis not present

## 2020-08-17 ENCOUNTER — Other Ambulatory Visit: Payer: Self-pay | Admitting: Family Medicine

## 2020-08-31 DIAGNOSIS — Z23 Encounter for immunization: Secondary | ICD-10-CM | POA: Diagnosis not present

## 2020-09-12 ENCOUNTER — Ambulatory Visit (INDEPENDENT_AMBULATORY_CARE_PROVIDER_SITE_OTHER)
Admission: RE | Admit: 2020-09-12 | Discharge: 2020-09-12 | Disposition: A | Discharge status: Home / Self Care | Payer: Medicare Other | Source: Ambulatory Visit | Attending: Family Medicine | Admitting: Family Medicine

## 2020-09-12 ENCOUNTER — Ambulatory Visit (INDEPENDENT_AMBULATORY_CARE_PROVIDER_SITE_OTHER): Payer: Medicare Other | Admitting: Family Medicine

## 2020-09-12 ENCOUNTER — Encounter: Payer: Self-pay | Admitting: Family Medicine

## 2020-09-12 ENCOUNTER — Other Ambulatory Visit: Payer: Self-pay

## 2020-09-12 VITALS — BP 130/90 | HR 73 | Temp 97.3°F | Ht 68.25 in | Wt 177.4 lb

## 2020-09-12 DIAGNOSIS — M79643 Pain in unspecified hand: Secondary | ICD-10-CM | POA: Diagnosis not present

## 2020-09-12 DIAGNOSIS — M25522 Pain in left elbow: Secondary | ICD-10-CM | POA: Diagnosis not present

## 2020-09-12 DIAGNOSIS — M25529 Pain in unspecified elbow: Secondary | ICD-10-CM | POA: Diagnosis not present

## 2020-09-12 DIAGNOSIS — Z23 Encounter for immunization: Secondary | ICD-10-CM

## 2020-09-12 DIAGNOSIS — M19071 Primary osteoarthritis, right ankle and foot: Secondary | ICD-10-CM | POA: Diagnosis not present

## 2020-09-12 DIAGNOSIS — M79673 Pain in unspecified foot: Secondary | ICD-10-CM

## 2020-09-12 MED ORDER — DICLOFENAC SODIUM 1 % EX CREA: TOPICAL_CREAM | CUTANEOUS | Status: DC

## 2020-09-12 NOTE — Progress Notes (Signed)
This visit occurred during the SARS-CoV-2 public health emergency.  Safety protocols were in place, including screening questions prior to the visit, additional usage of staff PPE, and extensive cleaning of exam room while observing appropriate contact time as indicated for disinfecting solutions.  She had her lap band removed.  She had f/u with Dr. Collene Mares with GI in the meantime.      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  She had R thumb bruising and then had pain along the prox phalanx on the R thumb.  Bruising resolved.  MCP and IP not ttp.  Not trauma known.    L elbow.  Medial pain.  Not lateral pain.  Olecranon not ttp.  She has been lifting/exercising at baseline but pain predates that.   R foot plantar pain.  Pain with walking.  She has been stretching.  No trauma.  She tried a heel lift with some relief.    Flu shot done at office visit.  Meds, vitals, and allergies reviewed.   ROS: Per HPI unless specifically indicated in ROS section   nad ncat L medial elbow ttp locally w/o normal testing o/w.  She does not have pain on testing for lateral epicondylitis.  Olecranon not tender.  Normal elbow range of motion.  Distally neurovascular intact. R lateral plantar calcaneal area ttp without bruising.  Able to bear weight.  Normal dorsalis pedis pulse and not tender to palpation on the medial malleolus/lateral malleolus/fifth metatarsal. Right hand without acute changes or bruising but she does have R thumb flexor side prox phalynx ttp without erythema.  Distally neurovascular intact.

## 2020-09-12 NOTE — Patient Instructions (Addendum)
Go to the lab on the way out.   If you have mychart we'll likely use that to update you.     Take care.  Glad to see you.  I would try using diclofenac cream/gel.  You can get that over the counter. Reasonable to use arch supports and ice as needed.

## 2020-09-17 DIAGNOSIS — M25529 Pain in unspecified elbow: Secondary | ICD-10-CM | POA: Insufficient documentation

## 2020-09-17 DIAGNOSIS — M79673 Pain in unspecified foot: Secondary | ICD-10-CM | POA: Insufficient documentation

## 2020-09-17 DIAGNOSIS — M79643 Pain in unspecified hand: Secondary | ICD-10-CM | POA: Insufficient documentation

## 2020-09-17 NOTE — Assessment & Plan Note (Signed)
Would not need imaging at this point.  May improve with topical diclofenac.  Likely soft tissue irritation along the proximal phalanx of the thumb

## 2020-09-17 NOTE — Assessment & Plan Note (Signed)
I would try using diclofenac cream/gel.  She can get that over the counter. Reasonable to use arch supports and ice as needed.  See notes on imaging.

## 2020-09-17 NOTE — Assessment & Plan Note (Signed)
Reasonable to check plain films today.  See notes on imaging. I would try using diclofenac cream/gel.  She can get that over the counter. Update me as needed.

## 2020-10-06 DIAGNOSIS — M170 Bilateral primary osteoarthritis of knee: Secondary | ICD-10-CM | POA: Diagnosis not present

## 2020-10-06 DIAGNOSIS — M2341 Loose body in knee, right knee: Secondary | ICD-10-CM | POA: Diagnosis not present

## 2020-11-11 DIAGNOSIS — M7062 Trochanteric bursitis, left hip: Secondary | ICD-10-CM | POA: Diagnosis not present

## 2020-11-11 DIAGNOSIS — M25552 Pain in left hip: Secondary | ICD-10-CM | POA: Diagnosis not present

## 2020-12-09 ENCOUNTER — Telehealth: Payer: Self-pay | Admitting: *Deleted

## 2020-12-09 NOTE — Telephone Encounter (Signed)
Patient left a voicemail stating that she is having pain from a uterine fibroid and wants to know if she should see a specialist or come in to see Dr. Para March.

## 2020-12-10 NOTE — Telephone Encounter (Signed)
We can go either way with this.  It could be done through our clinic or through gynecology.  If she is having significant pain she needs to be seen sooner rather than later.  Please either get her scheduled here or let me know if she needs a referral to gynecology.  Thanks.

## 2020-12-12 NOTE — Telephone Encounter (Signed)
Spoke with pt and she stated that she will schedule with you and I sent the message to D. Boyd to contact pt to schedule an appt.  Please Advise

## 2020-12-12 NOTE — Telephone Encounter (Signed)
Called PT and set up apt for 1/17 at 3

## 2020-12-12 NOTE — Telephone Encounter (Signed)
Can you contact pt and get them schedule for an OV to F/U with her uterine fibroids.  Thank you,  Leamon Arnt

## 2020-12-15 ENCOUNTER — Other Ambulatory Visit: Payer: Medicare Other

## 2020-12-22 ENCOUNTER — Ambulatory Visit: Payer: Medicare Other | Admitting: Family Medicine

## 2021-01-01 ENCOUNTER — Other Ambulatory Visit: Payer: Self-pay

## 2021-01-01 ENCOUNTER — Encounter: Payer: Self-pay | Admitting: Family Medicine

## 2021-01-01 ENCOUNTER — Ambulatory Visit (INDEPENDENT_AMBULATORY_CARE_PROVIDER_SITE_OTHER): Payer: Medicare Other | Admitting: Family Medicine

## 2021-01-01 VITALS — BP 110/70 | HR 56 | Temp 97.6°F | Ht 68.0 in | Wt 184.0 lb

## 2021-01-01 DIAGNOSIS — E785 Hyperlipidemia, unspecified: Secondary | ICD-10-CM

## 2021-01-01 DIAGNOSIS — R1032 Left lower quadrant pain: Secondary | ICD-10-CM

## 2021-01-01 LAB — COMPREHENSIVE METABOLIC PANEL
ALT: 15 U/L (ref 0–35)
AST: 22 U/L (ref 0–37)
Albumin: 4.4 g/dL (ref 3.5–5.2)
Alkaline Phosphatase: 68 U/L (ref 39–117)
BUN: 16 mg/dL (ref 6–23)
CO2: 31 mEq/L (ref 19–32)
Calcium: 10.2 mg/dL (ref 8.4–10.5)
Chloride: 106 mEq/L (ref 96–112)
Creatinine, Ser: 1 mg/dL (ref 0.40–1.20)
GFR: 58.04 mL/min — ABNORMAL LOW (ref >60.00)
Glucose, Bld: 99 mg/dL (ref 70–99)
Potassium: 4.5 mEq/L (ref 3.5–5.1)
Sodium: 142 mEq/L (ref 135–145)
Total Bilirubin: 0.6 mg/dL (ref 0.2–1.2)
Total Protein: 7.2 g/dL (ref 6.0–8.3)

## 2021-01-01 LAB — LIPID PANEL
Cholesterol: 282 mg/dL — ABNORMAL HIGH (ref 0–200)
HDL: 87.9 mg/dL (ref >39.00)
LDL Cholesterol: 181 mg/dL — ABNORMAL HIGH (ref 0–99)
NonHDL: 194.06
Total CHOL/HDL Ratio: 3
Triglycerides: 66 mg/dL (ref 0.0–149.0)
VLDL: 13.2 mg/dL (ref 0.0–40.0)

## 2021-01-01 LAB — CBC WITH DIFFERENTIAL/PLATELET
Basophils Absolute: 0.1 10*3/uL (ref 0.0–0.1)
Basophils Relative: 0.9 % (ref 0.0–3.0)
Eosinophils Absolute: 0.2 10*3/uL (ref 0.0–0.7)
Eosinophils Relative: 2.8 % (ref 0.0–5.0)
HCT: 39 % (ref 36.0–46.0)
Hemoglobin: 12.9 g/dL (ref 12.0–15.0)
Lymphocytes Relative: 39.3 % (ref 12.0–46.0)
Lymphs Abs: 2.5 10*3/uL (ref 0.7–4.0)
MCHC: 33 g/dL (ref 30.0–36.0)
MCV: 84.2 fl (ref 78.0–100.0)
Monocytes Absolute: 0.5 10*3/uL (ref 0.1–1.0)
Monocytes Relative: 7.4 % (ref 3.0–12.0)
Neutro Abs: 3.1 10*3/uL (ref 1.4–7.7)
Neutrophils Relative %: 49.6 % (ref 43.0–77.0)
Platelets: 287 10*3/uL (ref 150.0–400.0)
RBC: 4.62 Mil/uL (ref 3.87–5.11)
RDW: 14.5 % (ref 11.5–15.5)
WBC: 6.2 10*3/uL (ref 4.0–10.5)

## 2021-01-01 NOTE — Patient Instructions (Signed)
Go to the lab on the way out.   If you have mychart we'll likely use that to update you.    We'll call about getting the ultrasound set up.  Take care.  Glad to see you.

## 2021-01-01 NOTE — Progress Notes (Signed)
This visit occurred during the SARS-CoV-2 public health emergency.  Safety protocols were in place, including screening questions prior to the visit, additional usage of staff PPE, and extensive cleaning of exam room while observing appropriate contact time as indicated for disinfecting solutions.  She has less effect from oxybutynin now.  D/w pt.  No dysuria.  Unclear if she is having compressive/space feeling effect from a fibroid which could affect her urination.  Discussed.  Fibroid has been present for years and she didn't have any concern/need to address until recently.  Now she has discomfort with abd exercises, with laying on L side. No vaginal bleeding.  She has noted puffiness on the L lower quadrant with local tenderness.  No blood in stool.     Her lap band is out.  She is swallowing better.  Less librax need/use.    She had knee injection but didn't have good relief.    Meds, vitals, and allergies reviewed.   ROS: Per HPI unless specifically indicated in ROS section   nad ncat Neck supple, no LA rrr ctab abd soft, not ttp except for minimal left lower quadrant tenderness without rebound.  I do not feel a discrete hernia or mass.  No rash.  No bruising. Ext well perfused  lower extremities without edema  32 minutes were devoted to patient care in this encounter (this includes time spent reviewing the patient's file/history, interviewing and examining the patient, counseling/reviewing plan with patient).

## 2021-01-04 DIAGNOSIS — E785 Hyperlipidemia, unspecified: Secondary | ICD-10-CM | POA: Insufficient documentation

## 2021-01-04 DIAGNOSIS — R1032 Left lower quadrant pain: Secondary | ICD-10-CM | POA: Insufficient documentation

## 2021-01-04 NOTE — Assessment & Plan Note (Signed)
We talked about rechecking her labs today.  See notes on labs.

## 2021-01-04 NOTE — Assessment & Plan Note (Signed)
She does not have severe or acute pain.  I am not concerned for diverticulitis given her timeline and symptoms.  She could be having some discomfort from a fibroid and this could theoretically affect her ability to completely void.  We talked about the differential.  She could have lower abdominal wall source such as a hernia, but I do not feel a hernia on exam.  She has not felt a mass or bulge.  She could have an abdominal wall strain.  We talked about options.  I think is reasonable to check basic labs today and also get an ultrasound set up given her known history of a fibroid.  She not having any alarming symptoms such as blood in her stool or any vaginal discharge/bleeding.  Still okay for outpatient follow-up.  She agrees to plan.

## 2021-01-14 ENCOUNTER — Ambulatory Visit
Admission: RE | Admit: 2021-01-14 | Discharge: 2021-01-14 | Disposition: A | Discharge status: Home / Self Care | Payer: Medicare Other | Source: Ambulatory Visit | Attending: Family Medicine | Admitting: Family Medicine

## 2021-01-14 DIAGNOSIS — D259 Leiomyoma of uterus, unspecified: Secondary | ICD-10-CM | POA: Diagnosis not present

## 2021-01-14 DIAGNOSIS — R1032 Left lower quadrant pain: Secondary | ICD-10-CM

## 2021-01-14 DIAGNOSIS — N854 Malposition of uterus: Secondary | ICD-10-CM | POA: Diagnosis not present

## 2021-01-14 DIAGNOSIS — N858 Other specified noninflammatory disorders of uterus: Secondary | ICD-10-CM | POA: Diagnosis not present

## 2021-02-02 ENCOUNTER — Telehealth: Payer: Self-pay

## 2021-02-02 MED ORDER — PENCICLOVIR 1 % EX CREA: 1.0000 "application " | TOPICAL_CREAM | CUTANEOUS | 1 refills | Status: DC

## 2021-02-02 NOTE — Telephone Encounter (Signed)
Sent. Thanks.   

## 2021-02-02 NOTE — Addendum Note (Signed)
Addended by: Tonia Ghent on: 02/02/2021 01:56 PM   Modules accepted: Orders

## 2021-02-02 NOTE — Telephone Encounter (Signed)
Patient notified by telephone that script has been sent to the pharmacy per her request.

## 2021-02-02 NOTE — Telephone Encounter (Signed)
[facility] Night - Client Nonclinical Telephone Record  AccessNurse Client [place] Night - Client Client Site [facility] [place] [place] Physician Renford Dills - MD Contact Type Call Who Is Calling Patient / Member / Family / Caregiver Caller Name Declined to provide Caller Phone Number [phone] Patient Name Dana Harding Patient DOB 11-Feb-1951 Call Type Message Only Information Provided Reason for Call Request to Speak to a Physician Initial Comment Caller states she was going to leave a voicemail. States she would like for the doctor to phone in a script for Denavir cream for cold sores. Nurse triage offered and caller declined. Requests message be sent. Disp. Time Disposition Final User 02/01/2021 10:33:18 PM General Information Provided Yes [place], [place] Call Closed By: Shireen Quan Transaction Date/Time: 02/01/2021 10:28:36 PM (ET)

## 2021-02-03 ENCOUNTER — Other Ambulatory Visit: Payer: Self-pay

## 2021-02-03 MED ORDER — PENCICLOVIR 1 % EX CREA: 1.0000 "application " | TOPICAL_CREAM | CUTANEOUS | 1 refills | Status: DC

## 2021-02-03 NOTE — Telephone Encounter (Signed)
Rx resent to [facility] as requested and patient notified.

## 2021-02-03 NOTE — Telephone Encounter (Signed)
Patient called.  Patient said she went to [facility] yesterday and called the pharmacy today and they said they didn't have the rx.  Patient said she prefers prescription be sent to [facility].  She said she changed the pharmacy in my chart.

## 2021-03-09 DIAGNOSIS — H26492 Other secondary cataract, left eye: Secondary | ICD-10-CM | POA: Diagnosis not present

## 2021-03-09 DIAGNOSIS — Z961 Presence of intraocular lens: Secondary | ICD-10-CM | POA: Diagnosis not present

## 2021-03-09 DIAGNOSIS — H04123 Dry eye syndrome of bilateral lacrimal glands: Secondary | ICD-10-CM | POA: Diagnosis not present

## 2021-03-09 DIAGNOSIS — H353132 Nonexudative age-related macular degeneration, bilateral, intermediate dry stage: Secondary | ICD-10-CM | POA: Diagnosis not present

## 2021-03-16 DIAGNOSIS — H26491 Other secondary cataract, right eye: Secondary | ICD-10-CM | POA: Diagnosis not present

## 2021-04-13 ENCOUNTER — Telehealth: Payer: Self-pay | Admitting: Family Medicine

## 2021-04-13 NOTE — Telephone Encounter (Signed)
Please check with patient about scheduling a yearly medicare visit when possible.  Her lipids were up on last check- we can recheck those and go from there.  Thanks.

## 2021-04-21 ENCOUNTER — Other Ambulatory Visit: Payer: Self-pay | Admitting: Family Medicine

## 2021-04-24 DIAGNOSIS — Z6829 Body mass index (BMI) 29.0-29.9, adult: Secondary | ICD-10-CM | POA: Diagnosis not present

## 2021-04-24 DIAGNOSIS — Z9884 Bariatric surgery status: Secondary | ICD-10-CM | POA: Diagnosis not present

## 2021-04-24 DIAGNOSIS — Z6841 Body Mass Index (BMI) 40.0 and over, adult: Secondary | ICD-10-CM | POA: Diagnosis not present

## 2021-04-24 DIAGNOSIS — R635 Abnormal weight gain: Secondary | ICD-10-CM | POA: Diagnosis not present

## 2021-06-09 ENCOUNTER — Encounter: Payer: Medicare Other | Admitting: Family Medicine

## 2021-06-23 ENCOUNTER — Other Ambulatory Visit: Payer: Self-pay

## 2021-06-23 ENCOUNTER — Other Ambulatory Visit (HOSPITAL_BASED_OUTPATIENT_CLINIC_OR_DEPARTMENT_OTHER): Payer: Self-pay

## 2021-06-23 ENCOUNTER — Ambulatory Visit: Payer: Medicare Other | Attending: Internal Medicine

## 2021-06-23 DIAGNOSIS — Z23 Encounter for immunization: Secondary | ICD-10-CM

## 2021-06-23 MED ORDER — PFIZER-BIONT COVID-19 VAC-TRIS 30 MCG/0.3ML IM SUSP
INTRAMUSCULAR | 0 refills | Status: DC
  Filled 2021-06-23: qty 0.3, 1d supply, fill #0

## 2021-06-23 NOTE — Progress Notes (Signed)
   Covid-19 Vaccination Clinic  Name:  Dana Harding    MRN: [id number] DOB: March 02, 1951  06/23/2021  [name] was observed post Covid-19 immunization for 15 minutes without incident. She was provided with Vaccine Information Sheet and instruction to access the V-Safe system.   [name] was instructed to call 911 with any severe reactions post vaccine: Difficulty breathing  Swelling of face and throat  A fast heartbeat  A bad rash all over body  Dizziness and weakness   Immunizations Administered     Name Date Dose VIS Date Route   PFIZER Comrnaty(Gray TOP) Covid-19 Vaccine 06/23/2021  1:39 PM 0.3 mL 11/13/2020 Intramuscular   Manufacturer: [place]   Lot: [id number]   [place]: [phone]

## 2021-07-17 ENCOUNTER — Encounter: Payer: Medicare Other | Admitting: Family Medicine

## 2021-07-27 DIAGNOSIS — Z1231 Encounter for screening mammogram for malignant neoplasm of breast: Secondary | ICD-10-CM | POA: Diagnosis not present

## 2021-07-27 LAB — HM MAMMOGRAPHY

## 2021-08-04 ENCOUNTER — Other Ambulatory Visit: Payer: Self-pay

## 2021-08-04 ENCOUNTER — Ambulatory Visit (INDEPENDENT_AMBULATORY_CARE_PROVIDER_SITE_OTHER): Payer: Medicare Other | Admitting: Family Medicine

## 2021-08-04 ENCOUNTER — Encounter: Payer: Self-pay | Admitting: Family Medicine

## 2021-08-04 VITALS — BP 120/80 | HR 75 | Temp 97.3°F | Ht 68.0 in | Wt 183.0 lb

## 2021-08-04 DIAGNOSIS — E785 Hyperlipidemia, unspecified: Secondary | ICD-10-CM

## 2021-08-04 DIAGNOSIS — M1710 Unilateral primary osteoarthritis, unspecified knee: Secondary | ICD-10-CM | POA: Diagnosis not present

## 2021-08-04 DIAGNOSIS — Z7189 Other specified counseling: Secondary | ICD-10-CM

## 2021-08-04 DIAGNOSIS — R350 Frequency of micturition: Secondary | ICD-10-CM

## 2021-08-04 DIAGNOSIS — Z0000 Encounter for general adult medical examination without abnormal findings: Secondary | ICD-10-CM

## 2021-08-04 DIAGNOSIS — M25569 Pain in unspecified knee: Secondary | ICD-10-CM

## 2021-08-04 DIAGNOSIS — Z23 Encounter for immunization: Secondary | ICD-10-CM

## 2021-08-04 LAB — LIPID PANEL
Cholesterol: 234 mg/dL — ABNORMAL HIGH (ref 0–200)
HDL: 76.3 mg/dL
LDL Cholesterol: 141 mg/dL — ABNORMAL HIGH (ref 0–99)
NonHDL: 157.35
Total CHOL/HDL Ratio: 3
Triglycerides: 84 mg/dL (ref 0.0–149.0)
VLDL: 16.8 mg/dL (ref 0.0–40.0)

## 2021-08-04 MED ORDER — OMEPRAZOLE 40 MG PO CPDR: 40.0000 mg | DELAYED_RELEASE_CAPSULE | Freq: Every day | ORAL | Status: DC | PRN

## 2021-08-04 MED ORDER — OXYBUTYNIN CHLORIDE ER 15 MG PO TB24: 15.0000 mg | ORAL_TABLET | Freq: Every day | ORAL | 3 refills | Status: DC

## 2021-08-04 NOTE — Progress Notes (Signed)
This visit occurred during the SARS-CoV-2 public health emergency.  Safety protocols were in place, including screening questions prior to the visit, additional usage of staff PPE, and extensive cleaning of exam room while observing appropriate contact time as indicated for disinfecting solutions.  I have personally reviewed the Medicare Annual Wellness questionnaire and have noted 1. The patient's medical and social history 2. Their use of alcohol, tobacco or illicit drugs 3. Their current medications and supplements 4. The patient's functional ability including ADL's, fall risks, home safety risks and hearing or visual             impairment. 5. Diet and physical activities 6. Evidence for depression or mood disorders  The patients weight, height, BMI have been recorded in the chart and visual acuity is per eye clinic.  I have made referrals, counseling and provided education to the patient based review of the above and I have provided the pt with a written personalized care plan for preventive services.  Provider list updated- see scanned forms.  Routine anticipatory guidance given to patient.  See health maintenance. The possibility exists that previously documented standard health maintenance information may have been brought forward from a previous encounter into this note.  If needed, that same information has been updated to reflect the current situation based on today's encounter.    Flu 2022 Shingles 2020 PNA-20 done at office visit 07/2021 Tetanus 2020 COVID-vaccine up-to-date. Colonoscopy due 2024 Breast cancer screening 2022 Bone density test done in 2019, okay to defer to 2023. Advance directive discussed with patient.  Daughter (Dr. Lavone Neri) designated if patient were incapacitated. Cognitive function addressed- see scanned forms- and if abnormal then additional documentation follows.   In addition to [facility], follow up visit for the below conditions:  Rare use of  librax and hydrocodone for pain.  D/w pt.  Rare use of BZD for anxiety.  D/w pt.    Oxybutynin helps.  She was asking about inc in dose to see if she gets slightly more effect.  Discussed options.  See plan.  She is putting up with knee pain.  She is going to go see orthopedics and I will await the consult note.  She has pain going up and down stairs.  She does well going up and down a slope as long as it is on a plane and not involving steps.  She did not have help with injection previously.  PMH and SH reviewed  Meds, vitals, and allergies reviewed.   ROS: Per HPI.  Unless specifically indicated otherwise in HPI, the patient denies:  General: fever. Eyes: acute vision changes ENT: sore throat Cardiovascular: chest pain Respiratory: SOB GI: vomiting GU: dysuria Musculoskeletal: acute back pain Derm: acute rash Neuro: acute motor dysfunction Psych: worsening mood Endocrine: polydipsia Heme: bleeding Allergy: hayfever  GEN: nad, alert and oriented HEENT: ncat NECK: supple w/o LA CV: rrr. PULM: ctab, no inc wob ABD: soft, +bs EXT: no edema SKIN: Well-perfused.

## 2021-08-04 NOTE — Patient Instructions (Signed)
Let me know if the higher dose of oxybutynin doesn't help. Take care.  Glad to see you.

## 2021-08-05 DIAGNOSIS — M25569 Pain in unspecified knee: Secondary | ICD-10-CM | POA: Insufficient documentation

## 2021-08-05 NOTE — Assessment & Plan Note (Signed)
Advance directive discussed with patient.  Daughter (Dr. Fichera) designated if patient were incapacitated. 

## 2021-08-05 NOTE — Assessment & Plan Note (Signed)
I will await orthopedic input.

## 2021-08-05 NOTE — Assessment & Plan Note (Signed)
Flu to be done this fall Shingles 2020 PNA-20 done at office visit 07/2021 Tetanus 2020 COVID-vaccine up-to-date. Colonoscopy due 2024 Breast cancer screening 2022 Bone density test done in 2019, okay to defer to 2023. Advance directive discussed with patient.  Daughter (Dr. Lavone Neri) designated if patient were incapacitated. Cognitive function addressed- see scanned forms- and if abnormal then additional documentation follows.

## 2021-08-05 NOTE — Assessment & Plan Note (Signed)
Reasonable to check labs.  Continue work on diet and exercise.  See notes on labs.

## 2021-08-05 NOTE — Assessment & Plan Note (Signed)
Reasonable to try increasing oxybutynin to 15 mg a day and she can update me as needed.

## 2021-08-10 ENCOUNTER — Other Ambulatory Visit: Payer: Self-pay | Admitting: Family Medicine

## 2021-08-10 DIAGNOSIS — E785 Hyperlipidemia, unspecified: Secondary | ICD-10-CM

## 2021-08-18 DIAGNOSIS — M67961 Unspecified disorder of synovium and tendon, right lower leg: Secondary | ICD-10-CM | POA: Diagnosis not present

## 2021-08-18 DIAGNOSIS — M238X1 Other internal derangements of right knee: Secondary | ICD-10-CM | POA: Diagnosis not present

## 2021-08-18 DIAGNOSIS — M949 Disorder of cartilage, unspecified: Secondary | ICD-10-CM | POA: Diagnosis not present

## 2021-08-25 DIAGNOSIS — M1710 Unilateral primary osteoarthritis, unspecified knee: Secondary | ICD-10-CM | POA: Diagnosis not present

## 2021-09-16 DIAGNOSIS — M2242 Chondromalacia patellae, left knee: Secondary | ICD-10-CM | POA: Diagnosis not present

## 2021-09-16 DIAGNOSIS — M2241 Chondromalacia patellae, right knee: Secondary | ICD-10-CM | POA: Diagnosis not present

## 2021-09-30 DIAGNOSIS — Z23 Encounter for immunization: Secondary | ICD-10-CM | POA: Diagnosis not present

## 2021-10-02 ENCOUNTER — Other Ambulatory Visit (HOSPITAL_BASED_OUTPATIENT_CLINIC_OR_DEPARTMENT_OTHER): Payer: Self-pay

## 2021-10-30 ENCOUNTER — Other Ambulatory Visit: Payer: Self-pay | Admitting: Family Medicine

## 2021-11-17 DIAGNOSIS — Z7689 Persons encountering health services in other specified circumstances: Secondary | ICD-10-CM | POA: Diagnosis not present

## 2021-11-17 DIAGNOSIS — Z8639 Personal history of other endocrine, nutritional and metabolic disease: Secondary | ICD-10-CM | POA: Diagnosis not present

## 2021-11-17 DIAGNOSIS — M159 Polyosteoarthritis, unspecified: Secondary | ICD-10-CM | POA: Diagnosis not present

## 2021-11-17 DIAGNOSIS — Z8669 Personal history of other diseases of the nervous system and sense organs: Secondary | ICD-10-CM | POA: Diagnosis not present

## 2021-11-17 DIAGNOSIS — E663 Overweight: Secondary | ICD-10-CM | POA: Diagnosis not present

## 2021-11-17 DIAGNOSIS — G479 Sleep disorder, unspecified: Secondary | ICD-10-CM | POA: Diagnosis not present

## 2021-11-17 DIAGNOSIS — R635 Abnormal weight gain: Secondary | ICD-10-CM | POA: Diagnosis not present

## 2021-11-17 DIAGNOSIS — E785 Hyperlipidemia, unspecified: Secondary | ICD-10-CM | POA: Diagnosis not present

## 2021-12-10 DIAGNOSIS — M2242 Chondromalacia patellae, left knee: Secondary | ICD-10-CM | POA: Diagnosis not present

## 2021-12-10 DIAGNOSIS — M2241 Chondromalacia patellae, right knee: Secondary | ICD-10-CM | POA: Diagnosis not present

## 2021-12-17 DIAGNOSIS — M2242 Chondromalacia patellae, left knee: Secondary | ICD-10-CM | POA: Diagnosis not present

## 2021-12-17 DIAGNOSIS — M2241 Chondromalacia patellae, right knee: Secondary | ICD-10-CM | POA: Diagnosis not present

## 2021-12-24 DIAGNOSIS — M2241 Chondromalacia patellae, right knee: Secondary | ICD-10-CM | POA: Diagnosis not present

## 2021-12-24 DIAGNOSIS — M2242 Chondromalacia patellae, left knee: Secondary | ICD-10-CM | POA: Diagnosis not present

## 2021-12-31 DIAGNOSIS — M2242 Chondromalacia patellae, left knee: Secondary | ICD-10-CM | POA: Diagnosis not present

## 2021-12-31 DIAGNOSIS — M2241 Chondromalacia patellae, right knee: Secondary | ICD-10-CM | POA: Diagnosis not present

## 2022-02-02 DIAGNOSIS — K224 Dyskinesia of esophagus: Secondary | ICD-10-CM | POA: Diagnosis not present

## 2022-02-02 DIAGNOSIS — R194 Change in bowel habit: Secondary | ICD-10-CM | POA: Diagnosis not present

## 2022-02-02 DIAGNOSIS — R140 Abdominal distension (gaseous): Secondary | ICD-10-CM | POA: Diagnosis not present

## 2022-02-18 ENCOUNTER — Ambulatory Visit (INDEPENDENT_AMBULATORY_CARE_PROVIDER_SITE_OTHER)
Admission: RE | Admit: 2022-02-18 | Discharge: 2022-02-18 | Disposition: A | Discharge status: Home / Self Care | Payer: Medicare Other | Source: Ambulatory Visit | Attending: Family Medicine | Admitting: Family Medicine

## 2022-02-18 ENCOUNTER — Ambulatory Visit (INDEPENDENT_AMBULATORY_CARE_PROVIDER_SITE_OTHER): Payer: Medicare Other | Admitting: Family Medicine

## 2022-02-18 ENCOUNTER — Encounter: Payer: Self-pay | Admitting: Family Medicine

## 2022-02-18 ENCOUNTER — Other Ambulatory Visit: Payer: Self-pay

## 2022-02-18 VITALS — BP 122/82 | HR 58 | Temp 97.2°F | Ht 68.0 in | Wt 188.0 lb

## 2022-02-18 DIAGNOSIS — K589 Irritable bowel syndrome without diarrhea: Secondary | ICD-10-CM

## 2022-02-18 DIAGNOSIS — M25552 Pain in left hip: Secondary | ICD-10-CM

## 2022-02-18 DIAGNOSIS — R140 Abdominal distension (gaseous): Secondary | ICD-10-CM | POA: Insufficient documentation

## 2022-02-18 MED ORDER — CILIDINIUM-CHLORDIAZEPOXIDE 2.5-5 MG PO CAPS: 1.0000 | ORAL_CAPSULE | Freq: Three times a day (TID) | ORAL | 1 refills | Status: DC | PRN

## 2022-02-18 MED ORDER — HYDROCODONE-ACETAMINOPHEN 5-325 MG PO TABS: 1.0000 | ORAL_TABLET | Freq: Three times a day (TID) | ORAL | 0 refills | Status: AC | PRN

## 2022-02-18 NOTE — Patient Instructions (Addendum)
Go to the lab on the way out.   If you have mychart we'll likely use that to update you.    ?Your BP is good.  ?I sent your refills.  ?Likely bursitis but reasonable to check xrays.  ?Use an ice bag for 5 minutes at a time.  Let me know if not better.  ?Take care.  Glad to see you. ? ?

## 2022-02-18 NOTE — Progress Notes (Signed)
This visit occurred during the SARS-CoV-2 public health emergency.  Safety protocols were in place, including screening questions prior to the visit, additional usage of staff PPE, and extensive cleaning of exam room while observing appropriate contact time as indicated for disinfecting solutions. ? ?She had PRP injection for knee pain and that helped.  She is trying to put off knee surgery.  Then she noted lower back pain and hip pain.   ? ?Dull ache R hip at baseline but then has sig L hip pain after walking.  Pain is clearly getting worse over the last few years.  Limping from pain.  Parking form done for patient.   ? ?Prev MRI report in chart, d/w pt.   ? ?BP wnl today.  It was elevated at GI clinic and needed recheck today.   ? ?Meds, vitals, and allergies reviewed.  ? ?ROS: Per HPI unless specifically indicated in ROS section  ? ?Nad ?Ncat ?Neck supple, no LA ?Rrr ?Ctab ?Abd soft, not ttp.  ?Lower back sore with twisting.  ?L>R greater troch area sore.  No pain with internal rotation of the hips.  Able to bear weight. ?

## 2022-02-21 DIAGNOSIS — M25552 Pain in left hip: Secondary | ICD-10-CM | POA: Insufficient documentation

## 2022-02-21 DIAGNOSIS — M25559 Pain in unspecified hip: Secondary | ICD-10-CM | POA: Insufficient documentation

## 2022-02-21 NOTE — Assessment & Plan Note (Addendum)
Likely bursitis but reasonable to check xrays.  Anatomy discussed with patient. ?I asked her to try using an ice bag for 5 minutes at a time.  She can update me as needed.  It is likely a combination of gait changes have contributed to her knee and hip and lower back pain. ? ?Blood pressure is improved today. ? ?I updated her prescriptions.  She is use hydrocodone rarely in the past. ?

## 2022-02-21 NOTE — Assessment & Plan Note (Signed)
I updated her prescriptions.  She is use Librax rarely in the past. ?

## 2022-03-29 ENCOUNTER — Telehealth: Payer: Self-pay | Admitting: Family Medicine

## 2022-03-29 NOTE — Telephone Encounter (Signed)
Pt dropped off disability placard document to be filled out. Pt states she will pick up once completed, placed in providers mailbox.Marland Kitchen  ?

## 2022-03-29 NOTE — Telephone Encounter (Signed)
Form placed in Dr. Carole Civil basket to sign once he returns to the office.  ?

## 2022-03-30 NOTE — Telephone Encounter (Signed)
I will work on this when I get back in the office.  Thanks. ?

## 2022-03-31 ENCOUNTER — Telehealth: Payer: Self-pay | Admitting: Family Medicine

## 2022-03-31 DIAGNOSIS — M2550 Pain in unspecified joint: Secondary | ICD-10-CM

## 2022-03-31 NOTE — Addendum Note (Signed)
Addended by: Tonia Ghent on: 03/31/2022 10:09 PM ? ? Modules accepted: Orders ? ?

## 2022-03-31 NOTE — Telephone Encounter (Signed)
I put in the referral.  I work on the form as quickly as I can.  Thanks. ?

## 2022-03-31 NOTE — Telephone Encounter (Signed)
Pt called about dropping off handicap paperwork and wants to know if it is ready to be picked up. Dropped it off on 03/29/2022. ? ?Reason for Referral Request: A lot of back and hip pain ? ?Has patient been seen PCP for this complaint? ?Yes ? ?Patient scheduled on: N/A ? ?Yes, please find out following information. ? ?Referral for which specialty: Rheumatologist ? ?Preferred office/provider: Dr. Estanislado Pandy ?[phone] ? ?  ?

## 2022-03-31 NOTE — Telephone Encounter (Signed)
Dr. Damita Dunnings still has form and should be done tomorrow 04/01/22. I will call patient when form is ready.  ? ?Patient also wants a referral to Rheum; Dr. Dora Sims  ?

## 2022-04-01 NOTE — Telephone Encounter (Signed)
Form finished and patient has been notified is ready to be picked up and also that referral was done.  ?

## 2022-04-07 DIAGNOSIS — H04123 Dry eye syndrome of bilateral lacrimal glands: Secondary | ICD-10-CM | POA: Diagnosis not present

## 2022-04-07 DIAGNOSIS — H353132 Nonexudative age-related macular degeneration, bilateral, intermediate dry stage: Secondary | ICD-10-CM | POA: Diagnosis not present

## 2022-04-07 DIAGNOSIS — Z961 Presence of intraocular lens: Secondary | ICD-10-CM | POA: Diagnosis not present

## 2022-04-28 IMAGING — DX DG FOOT COMPLETE 3+V*R*
3 series · 3 of 3 positions shown · non-contrast
Comparison: None.

CLINICAL DATA: Foot pain

EXAM:
RIGHT FOOT COMPLETE - 3+ VIEW

[foot ap]
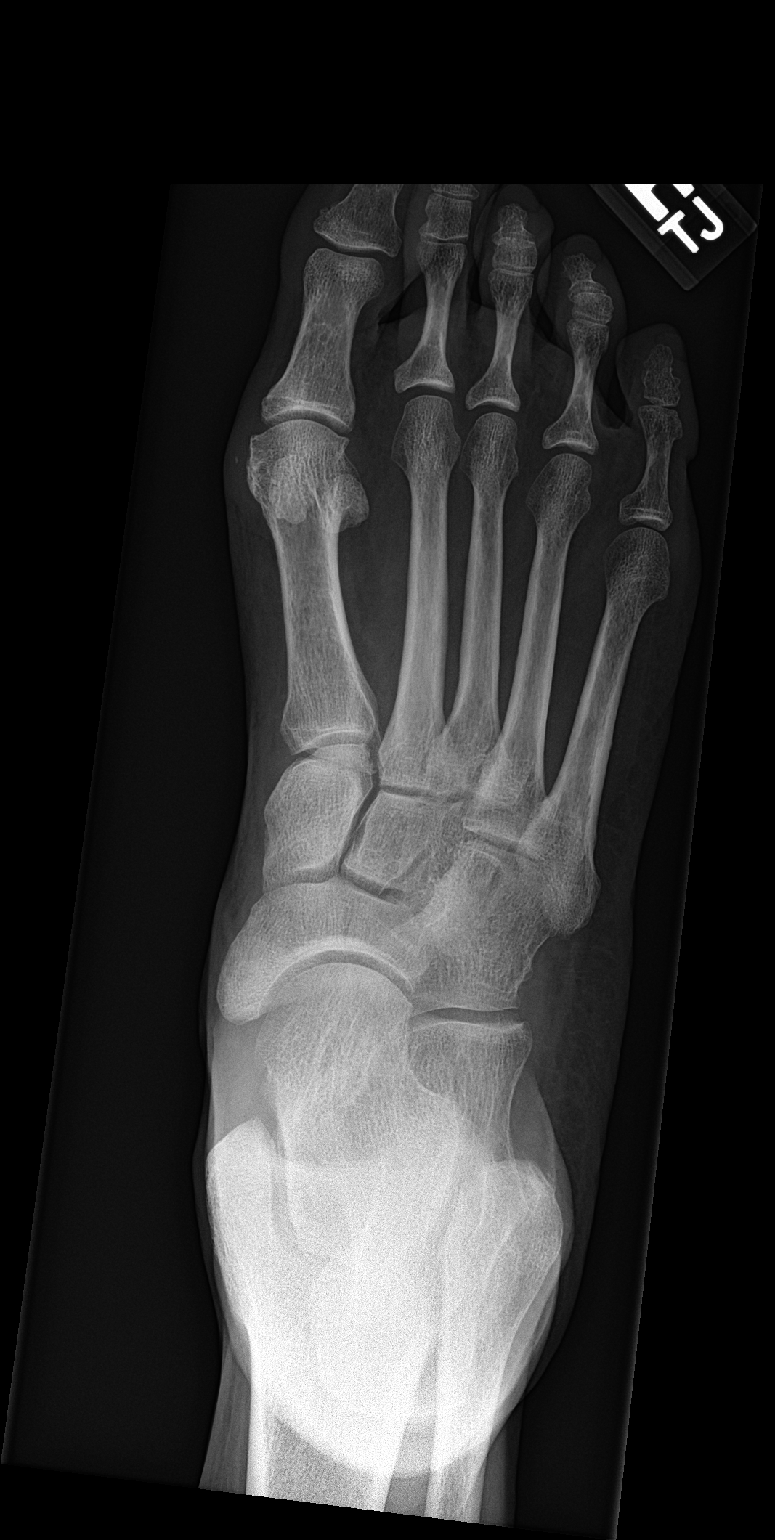

[foot obl]
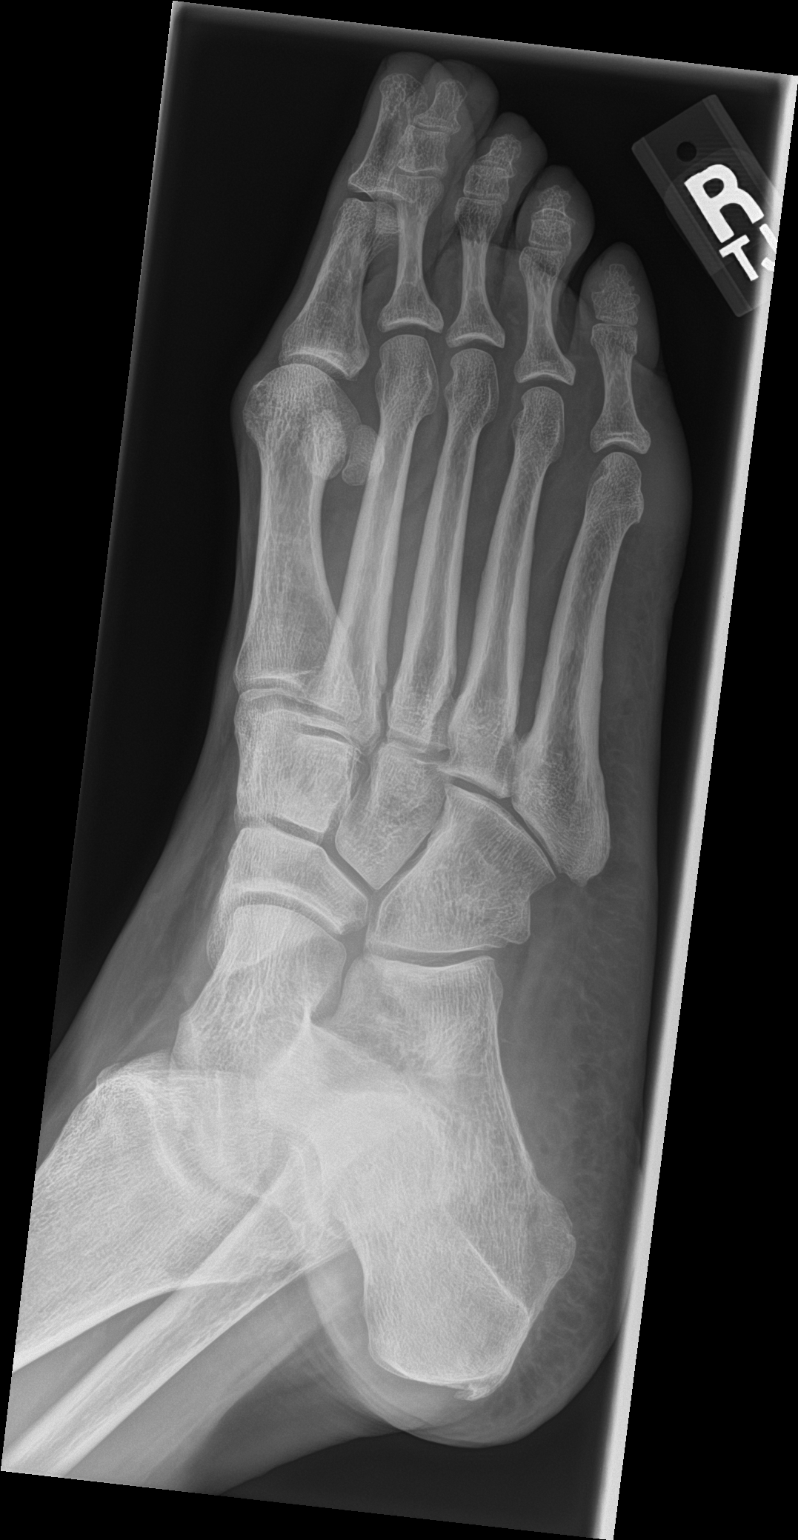

[foot lat]
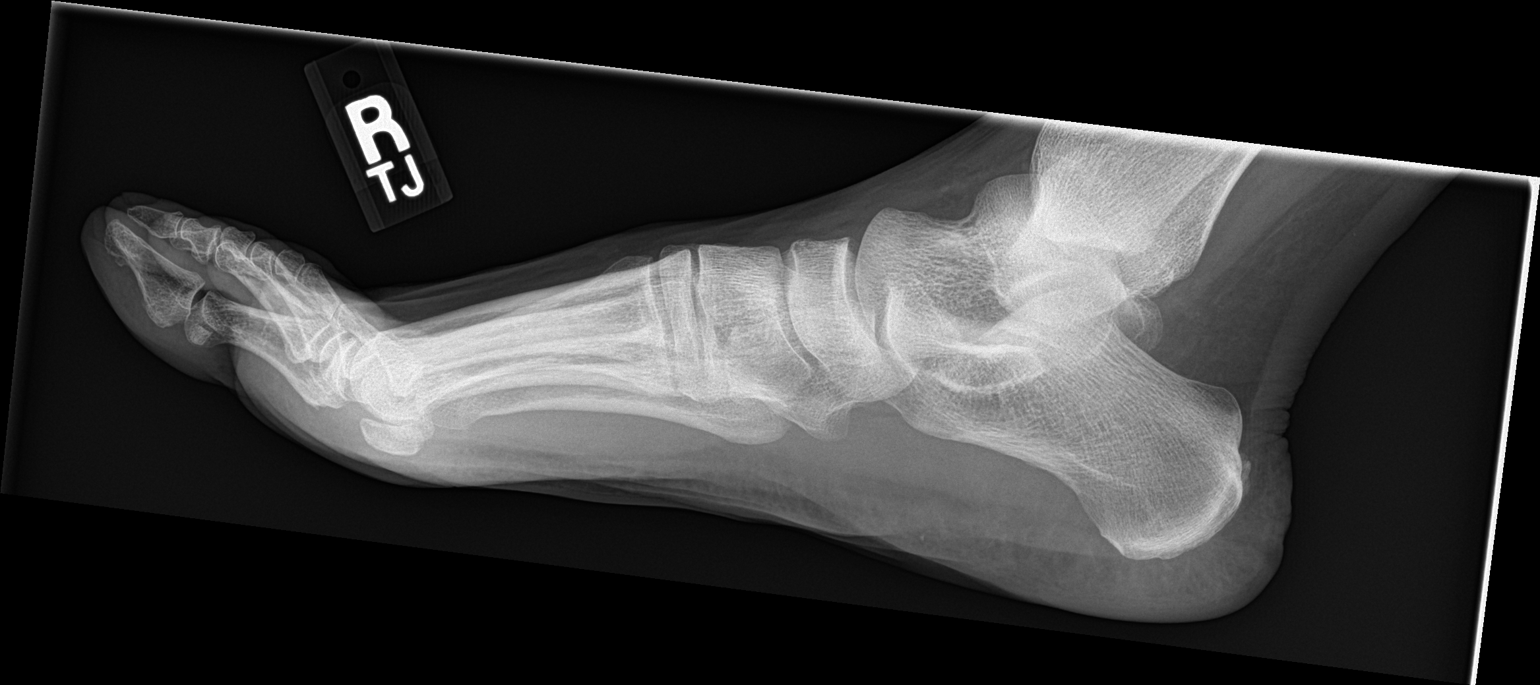

[3 of 3 positions shown; findings below may reference images not displayed]

FINDINGS: There is no evidence of fracture or dislocation. Mild degenerative
change at the first MTP joint.
IMPRESSION: No acute osseous abnormality.

## 2022-05-27 DIAGNOSIS — M25551 Pain in right hip: Secondary | ICD-10-CM | POA: Diagnosis not present

## 2022-05-27 DIAGNOSIS — M25552 Pain in left hip: Secondary | ICD-10-CM | POA: Diagnosis not present

## 2022-06-01 ENCOUNTER — Encounter: Payer: Self-pay | Admitting: Family Medicine

## 2022-06-01 ENCOUNTER — Ambulatory Visit (INDEPENDENT_AMBULATORY_CARE_PROVIDER_SITE_OTHER): Payer: Medicare Other | Admitting: Family Medicine

## 2022-06-01 DIAGNOSIS — R42 Dizziness and giddiness: Secondary | ICD-10-CM

## 2022-06-01 NOTE — Progress Notes (Signed)
Episodic sx over the last few weeks.  Was getting her hair done, laying back the room started spinning.  Similar at the dentist or when putting her eyedrops in.  Can happen rolling over in the bed.  She clearly felt room moving in a circle.  No syncope.  She had HA at the time, R sided.  No trauma.  Some random nausea unrelated to eating/fasting.  She is gradually getting better over the last few days.    Meds, vitals, and allergies reviewed.   ROS: Per HPI unless specifically indicated in ROS section   GEN: nad, alert and oriented HEENT: ncat NECK: supple w/o LA CV: rrr.  PULM: ctab, no inc wob ABD: soft, +bs EXT: no edema SKIN: well perfused.  CN 2-12 wnl B, S/S wnl x4 DHP negative with laying down and looking to the right but positive upon sitting back up.  She did not have similar symptoms with moving from supine to sitting when she did not rotate her head.

## 2022-06-02 NOTE — Assessment & Plan Note (Signed)
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  She is already improving.

## 2022-06-03 ENCOUNTER — Other Ambulatory Visit: Payer: Self-pay | Admitting: Gastroenterology

## 2022-06-03 ENCOUNTER — Ambulatory Visit (HOSPITAL_BASED_OUTPATIENT_CLINIC_OR_DEPARTMENT_OTHER)
Admission: RE | Admit: 2022-06-03 | Discharge: 2022-06-03 | Disposition: A | Discharge status: Home / Self Care | Payer: Medicare Other | Source: Ambulatory Visit | Attending: Gastroenterology | Admitting: Gastroenterology

## 2022-06-03 ENCOUNTER — Other Ambulatory Visit (HOSPITAL_BASED_OUTPATIENT_CLINIC_OR_DEPARTMENT_OTHER): Payer: Self-pay | Admitting: Gastroenterology

## 2022-06-03 DIAGNOSIS — R110 Nausea: Secondary | ICD-10-CM | POA: Diagnosis not present

## 2022-06-03 DIAGNOSIS — R1033 Periumbilical pain: Secondary | ICD-10-CM | POA: Insufficient documentation

## 2022-06-03 DIAGNOSIS — K5904 Chronic idiopathic constipation: Secondary | ICD-10-CM | POA: Diagnosis not present

## 2022-06-03 DIAGNOSIS — K7291 Hepatic failure, unspecified with coma: Secondary | ICD-10-CM | POA: Diagnosis not present

## 2022-06-03 MED ORDER — IOHEXOL 300 MG/ML  SOLN
100.0000 mL | Freq: Once | INTRAMUSCULAR | Status: AC | PRN
  Administered 2022-06-03: 80 mL via INTRAVENOUS

## 2022-06-30 ENCOUNTER — Telehealth: Payer: Self-pay | Admitting: Family Medicine

## 2022-06-30 NOTE — Telephone Encounter (Signed)
Caller Name: Ashiya Kinkead Call back phone #: [phone]  MEDICATION(S): ALPRAZolam Duanne Moron) 0.5 MG tablet  Days of Med Remaining: 2 pills left, she said she doesn't use all the time and knows its been years since last filled  Has the patient contacted their pharmacy (YES/NO)? No, controlled and she uses a new pharmacy What did pharmacy advise?  Preferred Pharmacy: [facility] [place], [place] [phone]   ~~~Please advise patient/caregiver to allow 2-3 business days to process RX refills.

## 2022-06-30 NOTE — Telephone Encounter (Signed)
LOV - 06/01/22 NOV - 08/13/22 RF - 03/30/2019 #30/0

## 2022-07-01 DIAGNOSIS — M1711 Unilateral primary osteoarthritis, right knee: Secondary | ICD-10-CM | POA: Insufficient documentation

## 2022-07-01 MED ORDER — ALPRAZOLAM 0.5 MG PO TABS: 0.2500 mg | ORAL_TABLET | Freq: Every day | ORAL | 0 refills | Status: DC | PRN

## 2022-07-01 NOTE — Telephone Encounter (Signed)
Sent. Thanks.   

## 2022-07-09 DIAGNOSIS — M7061 Trochanteric bursitis, right hip: Secondary | ICD-10-CM | POA: Diagnosis not present

## 2022-07-09 DIAGNOSIS — M7062 Trochanteric bursitis, left hip: Secondary | ICD-10-CM | POA: Diagnosis not present

## 2022-07-28 ENCOUNTER — Other Ambulatory Visit: Payer: Self-pay | Admitting: Family Medicine

## 2022-07-28 DIAGNOSIS — E785 Hyperlipidemia, unspecified: Secondary | ICD-10-CM

## 2022-07-30 ENCOUNTER — Other Ambulatory Visit: Payer: Medicare Other

## 2022-08-02 ENCOUNTER — Other Ambulatory Visit: Payer: Self-pay | Admitting: Family Medicine

## 2022-08-02 DIAGNOSIS — Z1231 Encounter for screening mammogram for malignant neoplasm of breast: Secondary | ICD-10-CM | POA: Diagnosis not present

## 2022-08-04 DIAGNOSIS — R1033 Periumbilical pain: Secondary | ICD-10-CM | POA: Insufficient documentation

## 2022-08-04 DIAGNOSIS — R1111 Vomiting without nausea: Secondary | ICD-10-CM | POA: Insufficient documentation

## 2022-08-05 ENCOUNTER — Ambulatory Visit (INDEPENDENT_AMBULATORY_CARE_PROVIDER_SITE_OTHER): Payer: Medicare Other

## 2022-08-05 VITALS — Ht 68.0 in | Wt 188.0 lb

## 2022-08-05 DIAGNOSIS — Z0000 Encounter for general adult medical examination without abnormal findings: Secondary | ICD-10-CM

## 2022-08-05 NOTE — Patient Instructions (Signed)
Dana Harding , Thank you for taking time to come for your Medicare Wellness Visit. I appreciate your ongoing commitment to your health goals. Please review the following plan we discussed and let me know if I can assist you in the future.   Screening recommendations/referrals: Colonoscopy: 02/16/13 Mammogram: 07/27/21 Bone Density: 11/15/18 Recommended yearly ophthalmology/optometry visit for glaucoma screening and checkup Recommended yearly dental visit for hygiene and checkup  Vaccinations: Influenza vaccine: 08/04/21 Pneumococcal vaccine: 08/04/21 Tdap vaccine: 01/02/19 Shingles vaccine: Zostavax 03/26/14   Shingrix 02/07/19, 05/21/19   Covid-19:01/06/20, 01/28/20, 08/31/20, 06/23/21  Advanced directives: no  Conditions/risks identified: none  Next appointment: Follow up in one year for your annual wellness visit - 08/09/23 @ 8:45 am by phone   Preventive Care 65 Years and Older, Female Preventive care refers to lifestyle choices and visits with your health care provider that can promote health and wellness. What does preventive care include? A yearly physical exam. This is also called an annual well check. Dental exams once or twice a year. Routine eye exams. Ask your health care provider how often you should have your eyes checked. Personal lifestyle choices, including: Daily care of your teeth and gums. Regular physical activity. Eating a healthy diet. Avoiding tobacco and drug use. Limiting alcohol use. Practicing safe sex. Taking low-dose aspirin every day. Taking vitamin and mineral supplements as recommended by your health care provider. What happens during an annual well check? The services and screenings done by your health care provider during your annual well check will depend on your age, overall health, lifestyle risk factors, and family history of disease. Counseling  Your health care provider may ask you questions about your: Alcohol use. Tobacco use. Drug  use. Emotional well-being. Home and relationship well-being. Sexual activity. Eating habits. History of falls. Memory and ability to understand (cognition). Work and work Statistician. Reproductive health. Screening  You may have the following tests or measurements: Height, weight, and BMI. Blood pressure. Lipid and cholesterol levels. These may be checked every 5 years, or more frequently if you are over 17 years old. Skin check. Lung cancer screening. You may have this screening every year starting at age 17 if you have a 30-pack-year history of smoking and currently smoke or have quit within the past 15 years. Fecal occult blood test (FOBT) of the stool. You may have this test every year starting at age 42. Flexible sigmoidoscopy or colonoscopy. You may have a sigmoidoscopy every 5 years or a colonoscopy every 10 years starting at age 49. Hepatitis C blood test. Hepatitis B blood test. Sexually transmitted disease (STD) testing. Diabetes screening. This is done by checking your blood sugar (glucose) after you have not eaten for a while (fasting). You may have this done every 1-3 years. Bone density scan. This is done to screen for osteoporosis. You may have this done starting at age 23. Mammogram. This may be done every 1-2 years. Talk to your health care provider about how often you should have regular mammograms. Talk with your health care provider about your test results, treatment options, and if necessary, the need for more tests. Vaccines  Your health care provider may recommend certain vaccines, such as: Influenza vaccine. This is recommended every year. Tetanus, diphtheria, and acellular pertussis (Tdap, Td) vaccine. You may need a Td booster every 10 years. Zoster vaccine. You may need this after age 36. Pneumococcal 13-valent conjugate (PCV13) vaccine. One dose is recommended after age 35. Pneumococcal polysaccharide (PPSV23) vaccine. One dose is recommended  after age  86. Talk to your health care provider about which screenings and vaccines you need and how often you need them. This information is not intended to replace advice given to you by your health care provider. Make sure you discuss any questions you have with your health care provider. Document Released: 12/19/2015 Document Revised: 08/11/2016 Document Reviewed: 09/23/2015 Elsevier Interactive Patient Education  2017 [place] Prevention in the Home Falls can cause injuries. They can happen to people of all ages. There are many things you can do to make your home safe and to help prevent falls. What can I do on the outside of my home? Regularly fix the edges of walkways and driveways and fix any cracks. Remove anything that might make you trip as you walk through a door, such as a raised step or threshold. Trim any bushes or trees on the path to your home. Use bright outdoor lighting. Clear any walking paths of anything that might make someone trip, such as rocks or tools. Regularly check to see if handrails are loose or broken. Make sure that both sides of any steps have handrails. Any raised decks and porches should have guardrails on the edges. Have any leaves, snow, or ice cleared regularly. Use sand or salt on walking paths during winter. Clean up any spills in your garage right away. This includes oil or grease spills. What can I do in the bathroom? Use night lights. Install grab bars by the toilet and in the tub and shower. Do not use towel bars as grab bars. Use non-skid mats or decals in the tub or shower. If you need to sit down in the shower, use a plastic, non-slip stool. Keep the floor dry. Clean up any water that spills on the floor as soon as it happens. Remove soap buildup in the tub or shower regularly. Attach bath mats securely with double-sided non-slip rug tape. Do not have throw rugs and other things on the floor that can make you trip. What can I do in the  bedroom? Use night lights. Make sure that you have a light by your bed that is easy to reach. Do not use any sheets or blankets that are too big for your bed. They should not hang down onto the floor. Have a firm chair that has side arms. You can use this for support while you get dressed. Do not have throw rugs and other things on the floor that can make you trip. What can I do in the kitchen? Clean up any spills right away. Avoid walking on wet floors. Keep items that you use a lot in easy-to-reach places. If you need to reach something above you, use a strong step stool that has a grab bar. Keep electrical cords out of the way. Do not use floor polish or wax that makes floors slippery. If you must use wax, use non-skid floor wax. Do not have throw rugs and other things on the floor that can make you trip. What can I do with my stairs? Do not leave any items on the stairs. Make sure that there are handrails on both sides of the stairs and use them. Fix handrails that are broken or loose. Make sure that handrails are as long as the stairways. Check any carpeting to make sure that it is firmly attached to the stairs. Fix any carpet that is loose or worn. Avoid having throw rugs at the top or bottom of the stairs. If you  do have throw rugs, attach them to the floor with carpet tape. Make sure that you have a light switch at the top of the stairs and the bottom of the stairs. If you do not have them, ask someone to add them for you. What else can I do to help prevent falls? Wear shoes that: Do not have high heels. Have rubber bottoms. Are comfortable and fit you well. Are closed at the toe. Do not wear sandals. If you use a stepladder: Make sure that it is fully opened. Do not climb a closed stepladder. Make sure that both sides of the stepladder are locked into place. Ask someone to hold it for you, if possible. Clearly mark and make sure that you can see: Any grab bars or  handrails. First and last steps. Where the edge of each step is. Use tools that help you move around (mobility aids) if they are needed. These include: Canes. Walkers. Scooters. Crutches. Turn on the lights when you go into a dark area. Replace any light bulbs as soon as they burn out. Set up your furniture so you have a clear path. Avoid moving your furniture around. If any of your floors are uneven, fix them. If there are any pets around you, be aware of where they are. Review your medicines with your doctor. Some medicines can make you feel dizzy. This can increase your chance of falling. Ask your doctor what other things that you can do to help prevent falls. This information is not intended to replace advice given to you by your health care provider. Make sure you discuss any questions you have with your health care provider. Document Released: 09/18/2009 Document Revised: 04/29/2016 Document Reviewed: 12/27/2014 Elsevier Interactive Patient Education  2017 [employer].

## 2022-08-05 NOTE — Progress Notes (Signed)
Virtual Visit via Telephone Note  I connected with  Dana Harding on 08/05/22 at  9:00 AM EDT by telephone and verified that I am speaking with the correct person using two identifiers.  Location: Patient: home Provider: Gerald Persons participating in the virtual visit: [place]   I discussed the limitations, risks, security and privacy concerns of performing an evaluation and management service by telephone and the availability of in person appointments. The patient expressed understanding and agreed to proceed.  Interactive audio and video telecommunications were attempted between this nurse and patient, however failed, due to patient having technical difficulties OR patient did not have access to video capability.  We continued and completed visit with audio only.  Some vital signs may be absent or patient reported.   Dionisio David, LPN  Subjective:   Dana Harding is a 71 y.o. female who presents for Medicare Annual (Subsequent) preventive examination.  Review of Systems           Objective:    There were no vitals filed for this visit. There is no height or weight on file to calculate BMI.     08/05/2022    9:06 AM 12/27/2018    8:45 AM 02/14/2013    8:42 AM  Advanced Directives  Does Patient Have a Medical Advance Directive? No Yes Patient would not like information  Type of Pension scheme manager Power of [place];Living will   Copy of [place] in Chart?  No - copy requested   Would patient like information on creating a medical advance directive? No - Patient declined      Current Medications (verified) Outpatient Encounter Medications as of 08/05/2022  Medication Sig   ALPRAZolam (XANAX) 0.5 MG tablet Take 0.5-1 tablets (0.25-0.5 mg total) by mouth daily as needed for anxiety.   Ascorbic Acid (VITAMIN C) 1000 MG tablet Take 1,000 mg by mouth daily.   augmented betamethasone dipropionate (DIPROLENE-AF) 0.05  % ointment Apply 1 application topically 2 (two) times daily as needed (rash/itching).   B Complex Vitamins (VITAMIN B COMPLEX PO) Take 1 tablet by mouth 2 (two) times a week.    carboxymethylcellulose (REFRESH PLUS) 0.5 % SOLN Place 1 drop into both eyes in the morning and at bedtime.   Cholecalciferol (VITAMIN D-3) 125 MCG (5000 UT) TABS Take 5,000 Units by mouth daily.   clidinium-chlordiazePOXIDE (LIBRAX) 5-2.5 MG capsule Take 1 capsule by mouth 3 (three) times daily as needed (esophageal spasms).   clobetasol ointment (TEMOVATE) 0.05 % APPLY A THIN LAYER TO THE ITCHY AREAS OF THE SCALP BID AS NEEDED   cyclobenzaprine (FLEXERIL) 10 MG tablet    Diclofenac Sodium 1 % CREA Use as needed up to 4 times a day.   Fluocinolone Acetonide Scalp 0.01 % OIL Apply 1 application topically daily as needed for irritation.   HYDROcodone-acetaminophen (NORCO/VICODIN) 5-325 MG tablet Take 1 tablet by mouth 3 (three) times daily as needed for moderate pain (sedation caution).   meclizine (ANTIVERT) 25 MG tablet Take 0.5-1 tablets (12.5-25 mg total) by mouth 3 (three) times daily as needed for dizziness.   Multiple Vitamins-Minerals (PRESERVISION AREDS 2 PO) Take 1 capsule by mouth daily.    nitroGLYCERIN (NITROSTAT) 0.4 MG SL tablet    oxybutynin (DITROPAN XL) 15 MG 24 hr tablet    oxybutynin (DITROPAN-XL) 10 MG 24 hr tablet TAKE 1 TABLET BY MOUTH EVERY DAY   oxyCODONE-acetaminophen (PERCOCET/ROXICET) 5-325 MG tablet    penciclovir (DENAVIR) 1 %  cream    triamcinolone (KENALOG) 0.025 % cream APPLY A THIN LAYER TO THE AFFECTED AREA(S) OF THE NECK QD.   aprepitant (EMEND) 40 MG capsule  (Patient not taking: Reported on 08/05/2022)   azelastine (ASTELIN) 0.1 % nasal spray Place 1-2 sprays into both nostrils 2 (two) times daily as needed for rhinitis. (Patient not taking: Reported on 08/05/2022)   dicyclomine (BENTYL) 10 MG capsule TAKE 1 CAPSULE (10 MG TOTAL) BY MOUTH 4 (FOUR) TIMES DAILY AS NEEDED FOR SPASMS.  (Patient not taking: Reported on 08/05/2022)   erythromycin ophthalmic ointment USE OVER YOUR EYELID INCISIONS FOUR TIMES PER DAY FOR 10 DAYS AFTER SURGERY (Patient not taking: Reported on 08/05/2022)   levofloxacin (LEVAQUIN) 500 MG tablet  (Patient not taking: Reported on 08/05/2022)   meloxicam (MOBIC) 7.5 MG tablet TAKE 1 TABLET (7.5 MG TOTAL) BY MOUTH DAILY. TAKE WITH FOOD (Patient not taking: Reported on 08/05/2022)   methocarbamol (ROBAXIN) 500 MG tablet  (Patient not taking: Reported on 08/05/2022)   metoCLOPramide (REGLAN) 10 MG tablet  (Patient not taking: Reported on 08/05/2022)   omeprazole (PRILOSEC) 40 MG capsule  (Patient not taking: Reported on 08/05/2022)   ondansetron (ZOFRAN-ODT) 8 MG disintegrating tablet  (Patient not taking: Reported on 08/05/2022)   temazepam (RESTORIL) 30 MG capsule  (Patient not taking: Reported on 08/05/2022)   traMADol (ULTRAM) 50 MG tablet TAKE 1 TABLET BY MOUTH EVERY 4-6 HOURS AS NEEDED DISCOMFORT. (Patient not taking: Reported on 08/05/2022)   trimethoprim-polymyxin b (POLYTRIM) ophthalmic solution PLACE 1 DROP INTO BOTH EYES 4 (FOUR) TIMES DAILY FOR 5 DAYS (Patient not taking: Reported on 08/05/2022)   No facility-administered encounter medications on file as of 08/05/2022.    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   bilateral articular L4-5; per MRI   Family history of malignant neoplasm of breast    Fitting and adjustment of gastric lap band    IBS (irritable bowel syndrome)    Inflammatory arthritis    H/o elevated ANA, treated with plaquenil   Lumbar foraminal stenosis 02/09/2007   mild; L4-5; per MRI   Mitral regurgitation 03/23/2007   mild   Obesity    Obstructive sleep apnea (adult) (pediatric)    prev  used CPAP, resolved and off CPAP as of 2016   Tricuspid regurgitation 03/23/2007   mild to moderate   Past Surgical History:  Procedure Laterality Date   BLEPHAROPLASTY     BREAST REDUCTION SURGERY  2009   CATARACT EXTRACTION Left 01/01/2019   CATARACT EXTRACTION W/ INTRAOCULAR LENS IMPLANT Right 12/11/2018   CHOLECYSTECTOMY  1980's   INGUINAL HERNIA REPAIR     bilateral   LAPAROSCOPIC GASTRIC BANDING  2007   removed   SHOULDER ARTHROSCOPY WITH ROTATOR CUFF REPAIR AND SUBACROMIAL DECOMPRESSION Left 02/22/2013   Procedure: LEFT SHOULDER ARTHROSCOPY WITH SUBACROMIAL DECOMPRESSION DISTAL CLAVICLE RESECTION with ROTATOR CUFF REPAIR  ;  Surgeon: Marin Shutter, MD;  Location: [facility];  Service: Orthopedics;  Laterality: Left;   TONSILLECTOMY AND ADENOIDECTOMY  1960   tummy tuck  1996   Family History  Problem Relation Age of Onset   Breast cancer Mother        < 73   Stroke Father        <  7   Bladder Cancer Sister    Cancer Sister        Neuroendocrine tumor found incidentally in the small intestine   Colon cancer Neg Hx    Social History   Socioeconomic History   Marital status: Single    Spouse name: Not on file   Number of children: 1   Years of education: Not on file   Highest education level: Not on file  Occupational History   Not on file  Tobacco Use   Smoking status: Former    Types: Cigarettes    Quit date: 12/06/1978    Years since quitting: 43.6   Smokeless tobacco: Never  Substance and Sexual Activity   Alcohol use: Yes    Comment: Occasional-weekend   Drug use: No   Sexual activity: Not on file  Other Topics Concern   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   Social Determinants of Health   Financial Resource Strain: Low Risk  (08/05/2022)   Overall Financial Resource Strain (CARDIA)    Difficulty of Paying Living Expenses: Not  hard at all  Food Insecurity: No Food Insecurity (08/05/2022)   Hunger Vital Sign    Worried About Running Out of Food in the Last Year: Never true    Clay in the Last Year: Never true  Transportation Needs: No Transportation Needs (08/05/2022)   PRAPARE - Hydrologist (Medical): No    Lack of Transportation (Non-Medical): No  Physical Activity: Insufficiently Active (08/05/2022)   Exercise Vital Sign    Days of Exercise per Week: 2 days    Minutes of Exercise per Session: 20 min  Stress: No Stress Concern Present (08/05/2022)   [place]    Feeling of Stress : Not at all  Social Connections: Socially Isolated (08/05/2022)   Social Connection and Isolation Panel [NHANES]    Frequency of Communication with Friends and Family: Twice a week    Frequency of Social Gatherings with Friends and Family: More than three times a week    Attends Religious Services: Never    Marine scientist or Organizations: No    Attends Music therapist: Never    Marital Status: Divorced    Tobacco Counseling Counseling given: Not Answered   Clinical Intake:  Pre-visit preparation completed: Yes  Pain : No/denies pain     Nutritional Risks: None Diabetes: No  How often do you need to have someone help you when you read instructions, pamphlets, or other written materials from your doctor or pharmacy?: 1 - Never  Diabetic?no  Interpreter Needed?: No  Information entered by :: Kirke Shaggy, LPN   Activities of Daily Living    08/05/2022    9:07 AM  In your present state of health, do you have any difficulty performing the following activities:  Hearing? 0  Vision? 0  Difficulty concentrating or making decisions? 0  Walking or climbing stairs? 1  Dressing or bathing? 0  Doing errands, shopping? 0  Preparing Food and eating ? N  Using the Toilet? N  In the past six  months, have you accidently leaked urine? N  Do you have problems with loss of bowel control? N  Managing your Medications? N  Managing your Finances?  N  Housekeeping or managing your Housekeeping? N    Patient Care Team: Tonia Ghent, MD as PCP - General Dasher, Jeneen Rinks, MD (Surgery) Juanita Craver, MD as Consulting Physician (Gastroenterology)  Indicate any recent Medical Services you may have received from other than [facility] providers in the past year (date may be approximate).     Assessment:   This is a routine wellness examination for Damia.  Hearing/Vision screen Hearing Screening - Comments:: No aids  Vision Screening - Comments:: Wears glasses- Dr.Groat  Dietary issues and exercise activities discussed: Current Exercise Habits: Home exercise routine, Type of exercise: walking, Time (Minutes): 20, Frequency (Times/Week): 2, Weekly Exercise (Minutes/Week): 40, Intensity: Mild   Goals Addressed             This Visit's Progress    DIET - EAT MORE FRUITS AND VEGETABLES         Depression Screen    08/05/2022    9:05 AM 08/06/2021    3:50 PM 12/27/2018    8:45 AM  PHQ 2/9 Scores  PHQ - 2 Score 0 0 0  PHQ- 9 Score 0 0 0    Fall Risk    08/05/2022    9:07 AM 06/01/2022   12:39 PM 01/01/2021   12:46 PM 12/27/2018    8:45 AM  [place] in the past year? 0 0 0 0  Number falls in past yr: 0 0 0   Injury with Fall? 0 0 0   Risk for fall due to : No Fall Risks No Fall Risks    Follow up Falls evaluation completed Falls evaluation completed Falls evaluation completed     [place]:  Any stairs in or around the home? Yes  If so, are there any without handrails? No  Home free of loose throw rugs in walkways, pet beds, electrical cords, etc? Yes  Adequate lighting in your home to reduce risk of falls? Yes   ASSISTIVE DEVICES UTILIZED TO PREVENT FALLS:  Life alert? No  Use of a cane, walker or w/c? No  Grab bars in the  bathroom? No  Shower chair or bench in shower? No  Elevated toilet seat or a handicapped toilet? No    Cognitive Function:    12/27/2018    9:49 AM  MMSE - Mini Mental State Exam  Orientation to time 5  Orientation to Place 5  Registration 3  Attention/ Calculation 0  Recall 3  Language- name 2 objects 0  Language- repeat 1  Language- follow 3 step command 3  Language- read & follow direction 0  Write a sentence 0  Copy design 0  Total score 20        08/05/2022    9:08 AM  6CIT Screen  What Year? 0 points  What month? 0 points  What time? 0 points  Count back from 20 0 points  Months in reverse 0 points  Repeat phrase 0 points  Total Score 0 points    Immunizations Immunization History  Administered Date(s) Administered   Fluad Quad(high Dose 65+) 08/09/2019, 09/12/2020, 08/04/2021   Influenza Split 10/17/2012   Influenza, Seasonal, Injecte, Preservative Fre 09/05/2015   Influenza,inj,Quad PF,6+ Mos 11/14/2014, 09/05/2015, 11/22/2017, 01/02/2019   Influenza-Unspecified 09/06/2015   PFIZER Comirnaty(Gray Top)Covid-19 Tri-Sucrose Vaccine 01/06/2020, 01/28/2020, 06/23/2021   PFIZER(Purple Top)SARS-COV-2 Vaccination 01/06/2020, 08/31/2020   PNEUMOCOCCAL CONJUGATE-20 08/04/2021   Pneumococcal Polysaccharide-23 12/06/2008, 01/02/2019   Td 12/07/2007   Tdap 01/02/2019  Zoster Recombinat (Shingrix) 02/07/2019, 05/21/2019   Zoster, Live 03/26/2014    TDAP status: Up to date  Flu Vaccine status: Up to date  Pneumococcal vaccine status: Up to date  Covid-19 vaccine status: Completed vaccines  Qualifies for Shingles Vaccine? Yes   Zostavax completed Yes   Shingrix Completed?: Yes  Screening Tests Health Maintenance  Topic Date Due   COVID-19 Vaccine (6 - Pfizer risk series) 08/18/2021   MAMMOGRAM  07/27/2022   INFLUENZA VACCINE  07/06/2022   COLONOSCOPY (Pts 45-20yr Insurance coverage will need to be confirmed)  02/17/2023   TETANUS/TDAP  01/02/2029    Pneumonia Vaccine 70 Years old  Completed   DEXA SCAN  Completed   Hepatitis C Screening  Completed   Zoster Vaccines- Shingrix  Completed   HPV VACCINES  Aged Out    Health Maintenance  Health Maintenance Due  Topic Date Due   COVID-19 Vaccine (6 - Pfizer risk series) 08/18/2021   MAMMOGRAM  07/27/2022   INFLUENZA VACCINE  07/06/2022    Colorectal cancer screening: Type of screening: Colonoscopy. Completed 02/16/13. Repeat every 10 years  Mammogram status: Completed this week. Repeat every year  Bone Density status: Completed 11/15/18. Results reflect: Bone density results: NORMAL. Repeat every 5 years.  Lung Cancer Screening: (Low Dose CT Chest recommended if Age 70-80years, 30 pack-year currently smoking OR have quit w/in 15years.) does not qualify.     Additional Screening:  Hepatitis C Screening: does qualify; Completed 12/27/18  Vision Screening: Recommended annual ophthalmology exams for early detection of glaucoma and other disorders of the eye. Is the patient up to date with their annual eye exam?  Yes  Who is the provider or what is the name of the office in which the patient attends annual eye exams? Dr.Groat If pt is not established with a provider, would they like to be referred to a provider to establish care? No .   Dental Screening: Recommended annual dental exams for proper oral hygiene  Community Resource Referral / Chronic Care Management: CRR required this visit?  No   CCM required this visit?  No      Plan:     I have personally reviewed and noted the following in the patient's chart:   Medical and social history Use of alcohol, tobacco or illicit drugs  Current medications and supplements including opioid prescriptions. Patient is currently taking opioid prescriptions. Information provided to patient regarding non-opioid alternatives. Patient advised to discuss non-opioid treatment plan with their provider. Functional ability and  status Nutritional status Physical activity Advanced directives List of other physicians Hospitalizations, surgeries, and ER visits in previous 12 months Vitals Screenings to include cognitive, depression, and falls Referrals and appointments  In addition, I have reviewed and discussed with patient certain preventive protocols, quality metrics, and best practice recommendations. A written personalized care plan for preventive services as well as general preventive health recommendations were provided to patient.     LDionisio David LPN   [id number]  Nurse Notes: none

## 2022-08-06 ENCOUNTER — Other Ambulatory Visit (INDEPENDENT_AMBULATORY_CARE_PROVIDER_SITE_OTHER): Payer: Medicare Other

## 2022-08-06 ENCOUNTER — Encounter: Payer: Medicare Other | Admitting: Family Medicine

## 2022-08-06 DIAGNOSIS — E785 Hyperlipidemia, unspecified: Secondary | ICD-10-CM | POA: Diagnosis not present

## 2022-08-06 LAB — LIPID PANEL
Cholesterol: 242 mg/dL — ABNORMAL HIGH (ref 0–200)
HDL: 64 mg/dL (ref >39.00)
LDL Cholesterol: 158 mg/dL — ABNORMAL HIGH (ref 0–99)
NonHDL: 178.28
Total CHOL/HDL Ratio: 4
Triglycerides: 102 mg/dL (ref 0.0–149.0)
VLDL: 20.4 mg/dL (ref 0.0–40.0)

## 2022-08-06 LAB — CBC WITH DIFFERENTIAL/PLATELET
Basophils Absolute: 0.1 10*3/uL (ref 0.0–0.1)
Basophils Relative: 1 % (ref 0.0–3.0)
Eosinophils Absolute: 0.3 10*3/uL (ref 0.0–0.7)
Eosinophils Relative: 5.1 % — ABNORMAL HIGH (ref 0.0–5.0)
HCT: 38.7 % (ref 36.0–46.0)
Hemoglobin: 12.6 g/dL (ref 12.0–15.0)
Lymphocytes Relative: 31 % (ref 12.0–46.0)
Lymphs Abs: 1.9 10*3/uL (ref 0.7–4.0)
MCHC: 32.6 g/dL (ref 30.0–36.0)
MCV: 85 fl (ref 78.0–100.0)
Monocytes Absolute: 0.5 10*3/uL (ref 0.1–1.0)
Monocytes Relative: 7.6 % (ref 3.0–12.0)
Neutro Abs: 3.4 10*3/uL (ref 1.4–7.7)
Neutrophils Relative %: 55.3 % (ref 43.0–77.0)
Platelets: 313 10*3/uL (ref 150.0–400.0)
RBC: 4.56 Mil/uL (ref 3.87–5.11)
RDW: 14.7 % (ref 11.5–15.5)
WBC: 6.2 10*3/uL (ref 4.0–10.5)

## 2022-08-06 LAB — COMPREHENSIVE METABOLIC PANEL
ALT: 12 U/L (ref 0–35)
AST: 16 U/L (ref 0–37)
Albumin: 4.1 g/dL (ref 3.5–5.2)
Alkaline Phosphatase: 94 U/L (ref 39–117)
BUN: 12 mg/dL (ref 6–23)
CO2: 30 mEq/L (ref 19–32)
Calcium: 9.7 mg/dL (ref 8.4–10.5)
Chloride: 104 mEq/L (ref 96–112)
Creatinine, Ser: 1.07 mg/dL (ref 0.40–1.20)
GFR: 52.92 mL/min — ABNORMAL LOW (ref >60.00)
Glucose, Bld: 97 mg/dL (ref 70–99)
Potassium: 4.2 mEq/L (ref 3.5–5.1)
Sodium: 141 mEq/L (ref 135–145)
Total Bilirubin: 0.6 mg/dL (ref 0.2–1.2)
Total Protein: 6.9 g/dL (ref 6.0–8.3)

## 2022-08-06 LAB — TSH: TSH: 1.32 u[IU]/mL (ref 0.35–5.50)

## 2022-08-12 LAB — HM MAMMOGRAPHY

## 2022-08-13 ENCOUNTER — Encounter: Payer: Self-pay | Admitting: Family Medicine

## 2022-08-13 ENCOUNTER — Ambulatory Visit (INDEPENDENT_AMBULATORY_CARE_PROVIDER_SITE_OTHER): Payer: Medicare Other | Admitting: Family Medicine

## 2022-08-13 VITALS — BP 138/82 | HR 74 | Temp 97.0°F | Ht 66.5 in | Wt 186.4 lb

## 2022-08-13 DIAGNOSIS — K224 Dyskinesia of esophagus: Secondary | ICD-10-CM

## 2022-08-13 DIAGNOSIS — E279 Disorder of adrenal gland, unspecified: Secondary | ICD-10-CM | POA: Diagnosis not present

## 2022-08-13 DIAGNOSIS — L299 Pruritus, unspecified: Secondary | ICD-10-CM

## 2022-08-13 DIAGNOSIS — Z7189 Other specified counseling: Secondary | ICD-10-CM

## 2022-08-13 DIAGNOSIS — Z0000 Encounter for general adult medical examination without abnormal findings: Secondary | ICD-10-CM

## 2022-08-13 DIAGNOSIS — M25559 Pain in unspecified hip: Secondary | ICD-10-CM | POA: Diagnosis not present

## 2022-08-13 DIAGNOSIS — F419 Anxiety disorder, unspecified: Secondary | ICD-10-CM | POA: Diagnosis not present

## 2022-08-13 DIAGNOSIS — Z23 Encounter for immunization: Secondary | ICD-10-CM | POA: Diagnosis not present

## 2022-08-13 MED ORDER — CYCLOBENZAPRINE HCL 10 MG PO TABS: 5.0000 mg | ORAL_TABLET | Freq: Three times a day (TID) | ORAL | 1 refills | Status: DC | PRN

## 2022-08-13 NOTE — Progress Notes (Unsigned)
Flu today Shingles 2020 PNA-20 done at office visit 07/2021 Tetanus 2020 COVID-vaccine up-to-date. Colonoscopy due 2024 Breast cancer screening 2023 per patient report.   Bone density test done in 2019, okay to defer to 2024. Advance directive discussed with patient.  Daughter (Dr. Lavone Neri) designated if patient were incapacitated.  History of esophageal spasm.  Doing well in general.  Swallowing well.  She will update me as needed.  Back and knee pain. Knee pain is reasonably better now but now with lower back pain and h/o hip bursitis.  Injection helped but didn't last past 7 weeks.  D/w pt about options.  She is icing at baseline.  Has used ibuprofen but still had pain, had to use a 1/2 tab of hydrocodone.  R>L hip pain.  She had more leg cramping at night, possibly from gait changes.  Also with some cramping in the arms episodically.    Mood d/w pt.  "Doing well."  Rare use of xanax.  No ADE on med.   1.4 cm LEFT adrenal lesion probable adrenal adenoma but indeterminate. Consider follow-up within 1 year with a adrenal protocol CT with washout.   She has heat but not sun related itching diffusely on the skin.  D/w pt about trying claritin.    Would defer statin given ongoing aches at this point.  Discussed with patient.  She agrees.  PMH and SH reviewed  Meds, vitals, and allergies reviewed.   ROS: Per HPI.  Unless specifically indicated otherwise in HPI, the patient denies:  General: fever. Eyes: acute vision changes ENT: sore throat Cardiovascular: chest pain Respiratory: SOB GI: vomiting GU: dysuria Musculoskeletal: acute back pain Derm: acute rash Neuro: acute motor dysfunction Psych: worsening mood Endocrine: polydipsia Heme: bleeding Allergy: hayfever  GEN: nad, alert and oriented HEENT: ncat NECK: supple w/o LA CV: rrr. PULM: ctab, no inc wob ABD: soft, +bs EXT: no edema SKIN: no acute rash  30 minutes were devoted to patient care in this encounter (this  includes time spent reviewing the patient's file/history, interviewing and examining the patient, counseling/reviewing plan with patient).

## 2022-08-13 NOTE — Patient Instructions (Addendum)
Check with Dr. Wynelle Link and see about options.   Flu shot today.  Take care.  Glad to see you. Try claritin if needed for itching.

## 2022-08-15 DIAGNOSIS — L299 Pruritus, unspecified: Secondary | ICD-10-CM | POA: Insufficient documentation

## 2022-08-15 DIAGNOSIS — E279 Disorder of adrenal gland, unspecified: Secondary | ICD-10-CM | POA: Insufficient documentation

## 2022-08-15 NOTE — Assessment & Plan Note (Signed)
Rare use of Xanax.  Continue.  Use.

## 2022-08-15 NOTE — Assessment & Plan Note (Signed)
I asked her to follow-up with orthopedics and see about options.

## 2022-08-15 NOTE — Assessment & Plan Note (Signed)
Flu today Shingles 2020 PNA-20 done at office visit 07/2021 Tetanus 2020 COVID-vaccine up-to-date. Colonoscopy due 2024 Breast cancer screening 2023 per patient report.   Bone density test done in 2019, okay to defer to 2024. Advance directive discussed with patient.  Daughter (Dr. Lavone Neri) designated if patient were incapacitated.

## 2022-08-15 NOTE — Assessment & Plan Note (Signed)
Heat but not sun itching. Discussed with patient about trial of Claritin but not Claritin-D.  She can update me as needed.

## 2022-08-15 NOTE — Assessment & Plan Note (Signed)
1.4 cm LEFT adrenal lesion probable adrenal adenoma but indeterminate. Consider follow-up within 1 year with a adrenal protocol CT with washout.    Discussed with patient about getting follow-up scan in June 2024.

## 2022-08-15 NOTE — Assessment & Plan Note (Signed)
Doing well.  She will update me as needed.

## 2022-08-15 NOTE — Assessment & Plan Note (Signed)
Advance directive discussed with patient.  Daughter (Dr. Lavone Neri) designated if patient were incapacitated.

## 2022-08-17 ENCOUNTER — Encounter: Payer: Self-pay | Admitting: Family Medicine

## 2022-08-20 DIAGNOSIS — M25551 Pain in right hip: Secondary | ICD-10-CM | POA: Diagnosis not present

## 2022-08-30 IMAGING — US US PELVIS COMPLETE WITH TRANSVAGINAL
2 series · 13 of 25 positions shown · non-contrast
Comparison: None

CLINICAL DATA: Initial evaluation for left lower quadrant pain,
history of fibroid.



[Series 1: us pelvis complete with transvaginal · 0.26mm/px · 12 of 41 slices shown (1 of 2)]
[im 1/41]
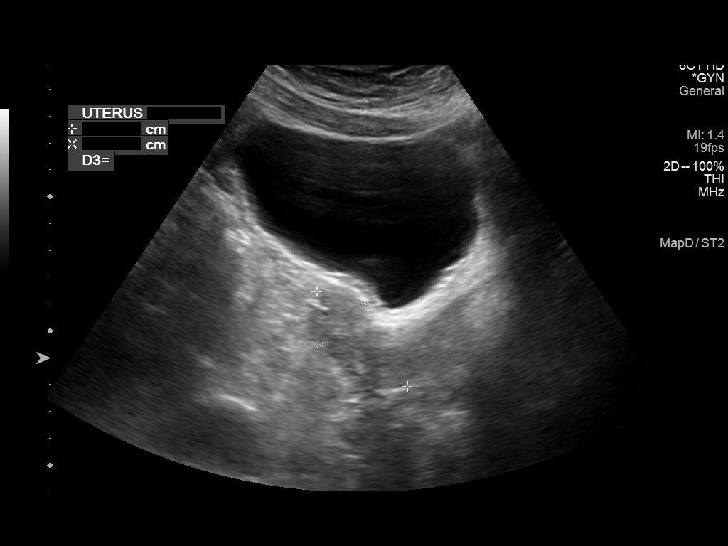
[im 4/41]
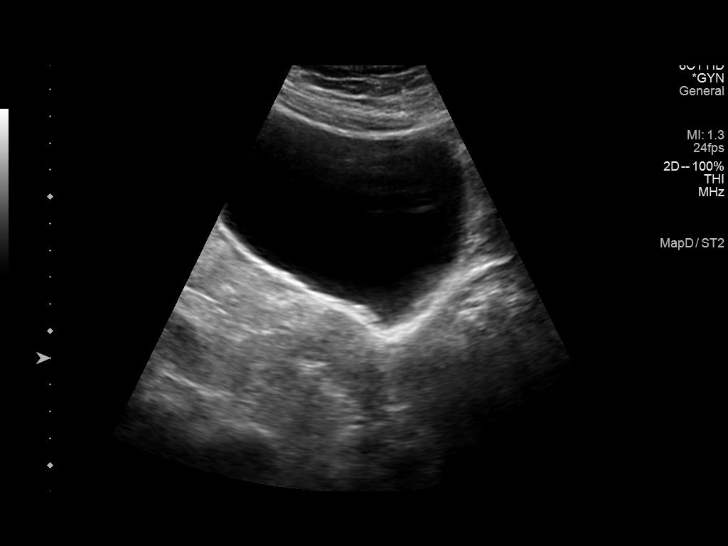
[im 8/41]
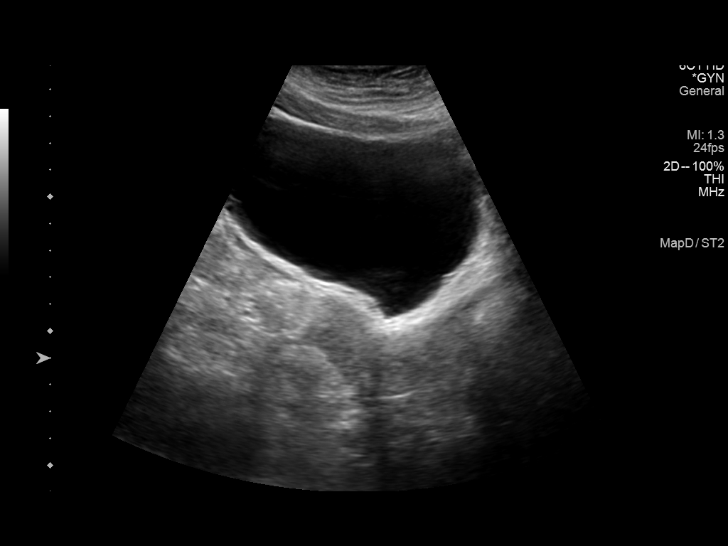
[im 11/41]
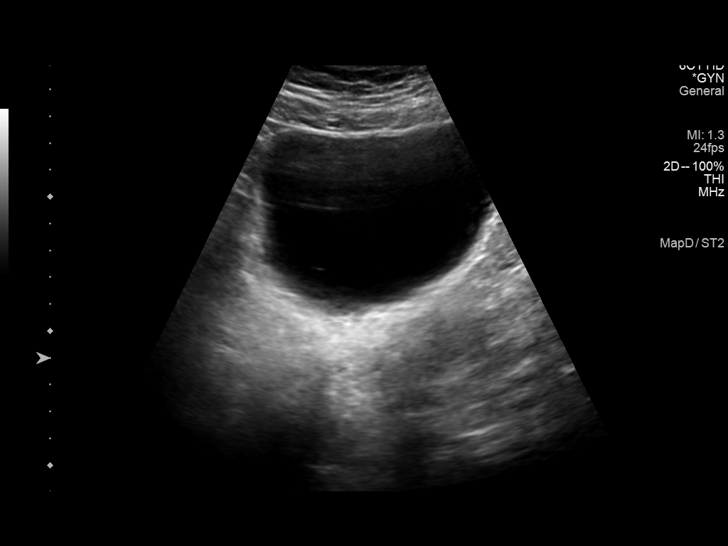
[im 15/41]
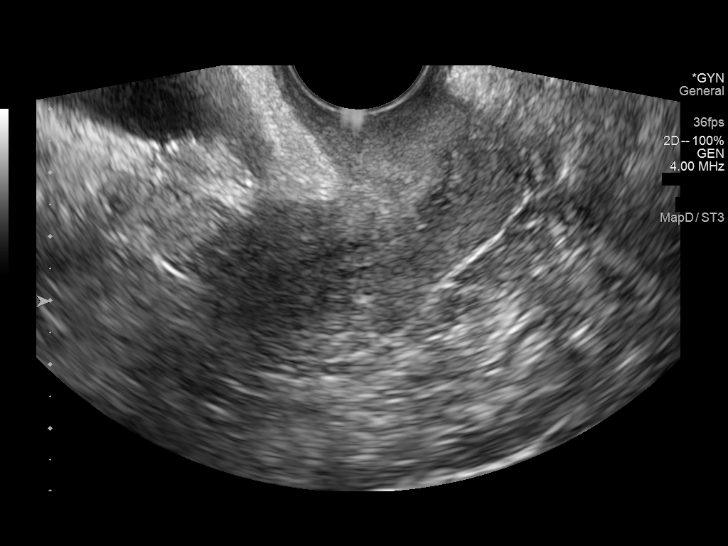
[im 19/41]
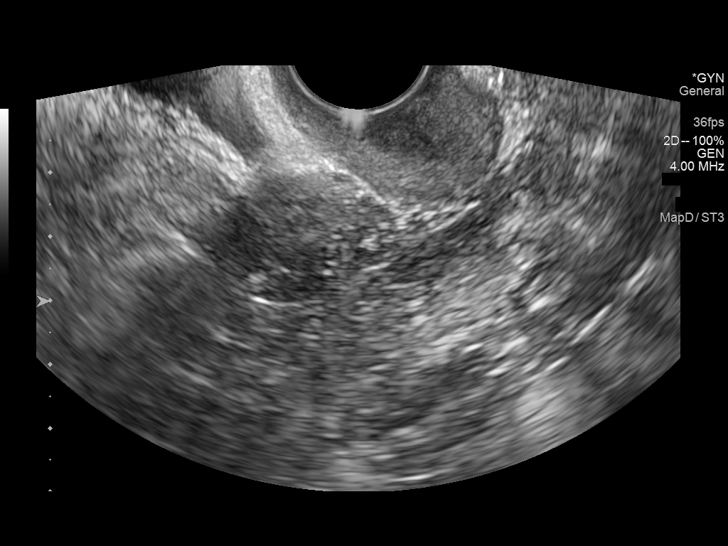
[im 22/41]
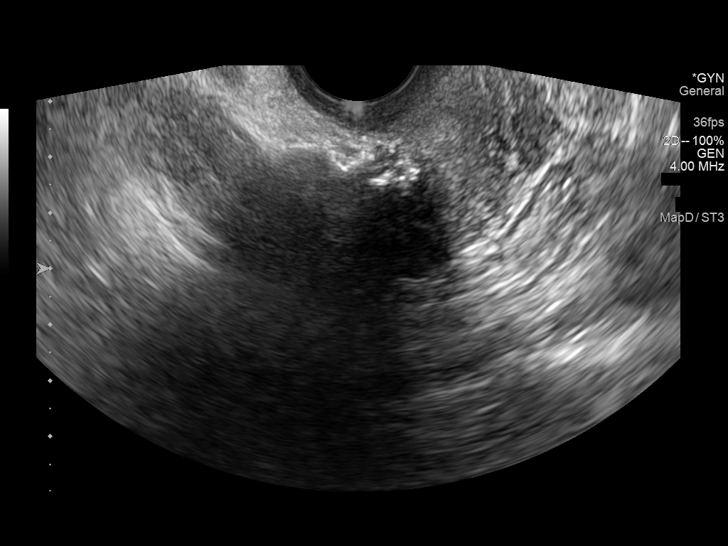
[im 26/41]
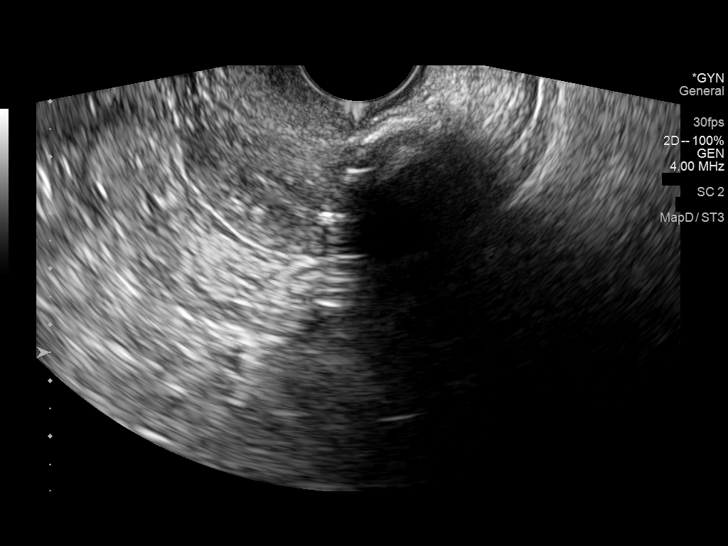
[im 30/41]
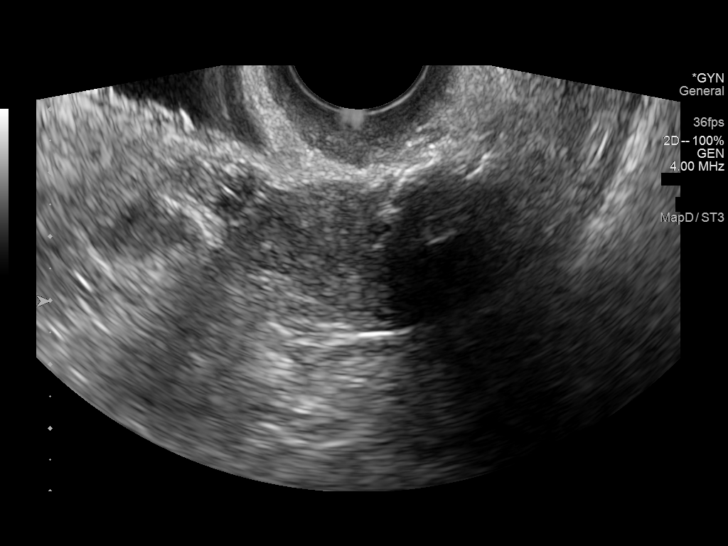
[im 33/41]
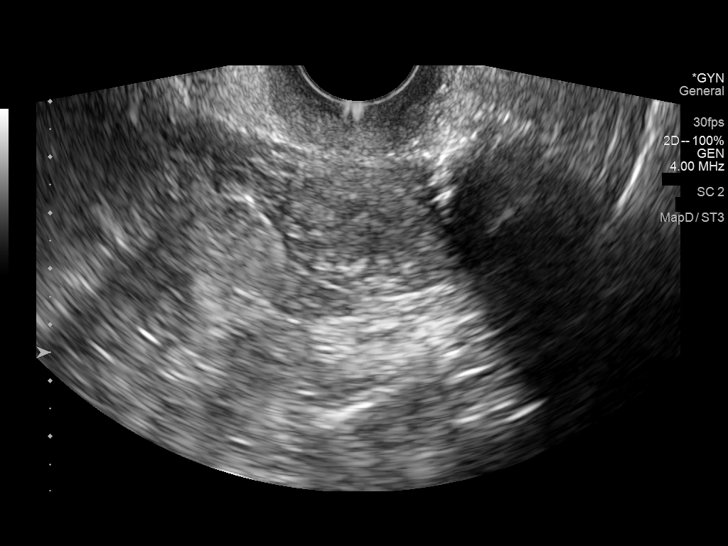
[im 37/41]
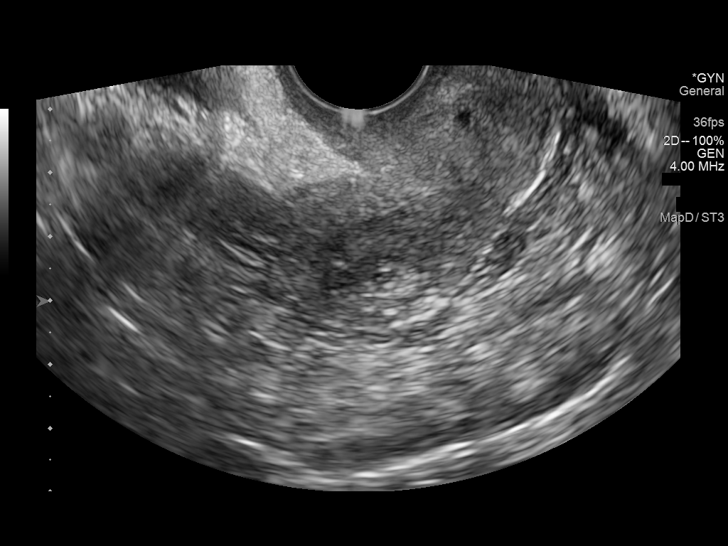
[im 41/41]
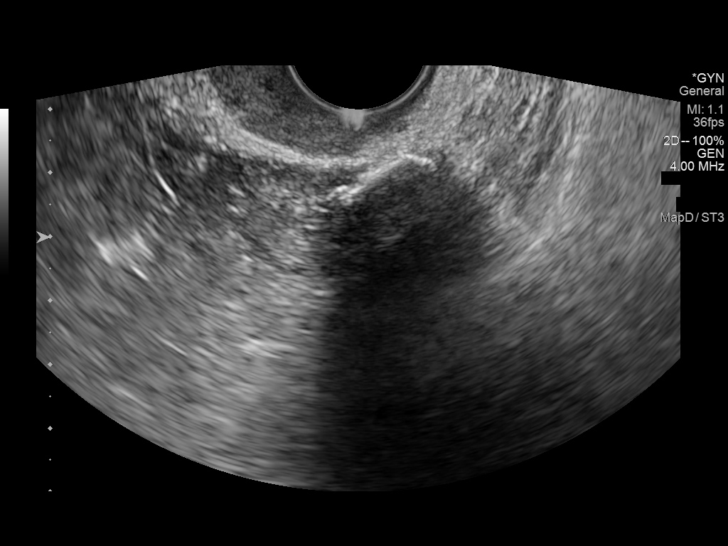

[Series 1001: us pelvis complete with transvaginal · 1 of 4 slices shown (2 of 2)]
[im 4/4]
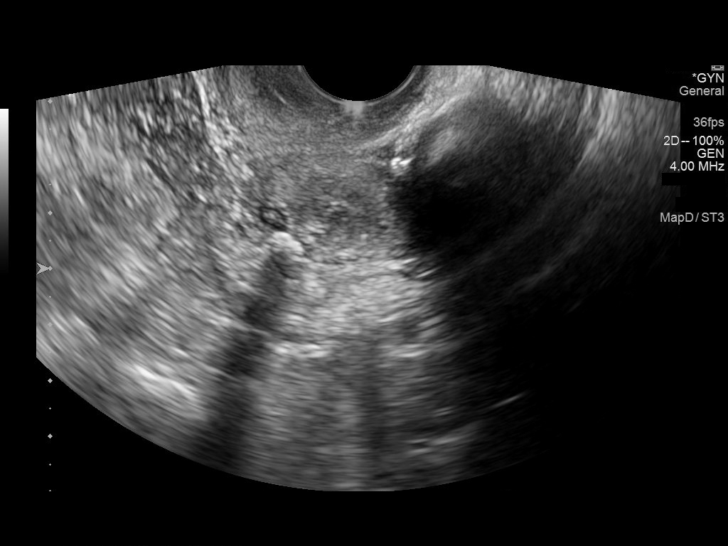

[13 of 25 positions shown; findings below may reference images not displayed]

FINDINGS: Uterus

Measurements: 4.9 x 2.3 x 3.9 cm = volume: 22.5 mL. Uterus is
anteverted. 4.0 x 2.5 x 2.6 cm exophytic fibroid extends from the
left uterine fundus. Associated shadowing echogenic material likely
reflects calcification. 8 mm calcification within the myometrium of
the right uterine body could reflect a small calcified fibroid.

Endometrium

Thickness: 3.1 mm.  No focal abnormality visualized.

Right ovary

Not visualized.  No adnexal mass.

Left ovary

Not visualized.  No adnexal mass.

Other findings

No abnormal free fluid.
IMPRESSION: 1. 4 cm exophytic fibroid arising from the left uterine fundus.
2. 8 mm calcification within the myometrium of the right uterine
body, which could reflect a small calcified fibroid as well.
3. Normal for age endometrium.
4. Nonvisualization of either ovary.  No adnexal mass or free fluid.

## 2022-09-17 DIAGNOSIS — Z23 Encounter for immunization: Secondary | ICD-10-CM | POA: Diagnosis not present

## 2022-09-29 DIAGNOSIS — M25551 Pain in right hip: Secondary | ICD-10-CM | POA: Diagnosis not present

## 2022-09-29 DIAGNOSIS — M24159 Other articular cartilage disorders, unspecified hip: Secondary | ICD-10-CM | POA: Diagnosis not present

## 2022-10-13 DIAGNOSIS — M25551 Pain in right hip: Secondary | ICD-10-CM | POA: Diagnosis not present

## 2022-10-19 ENCOUNTER — Ambulatory Visit: Payer: Medicare Other | Admitting: Rheumatology

## 2022-11-09 ENCOUNTER — Ambulatory Visit: Payer: Medicare Other | Admitting: Rheumatology

## 2022-12-01 DIAGNOSIS — M170 Bilateral primary osteoarthritis of knee: Secondary | ICD-10-CM | POA: Diagnosis not present

## 2022-12-01 DIAGNOSIS — M25561 Pain in right knee: Secondary | ICD-10-CM | POA: Insufficient documentation

## 2022-12-06 DIAGNOSIS — C801 Malignant (primary) neoplasm, unspecified: Secondary | ICD-10-CM

## 2022-12-06 HISTORY — DX: Malignant (primary) neoplasm, unspecified: C80.1

## 2023-01-20 DIAGNOSIS — K5900 Constipation, unspecified: Secondary | ICD-10-CM | POA: Diagnosis not present

## 2023-01-20 DIAGNOSIS — K224 Dyskinesia of esophagus: Secondary | ICD-10-CM | POA: Diagnosis not present

## 2023-01-20 DIAGNOSIS — Z1211 Encounter for screening for malignant neoplasm of colon: Secondary | ICD-10-CM | POA: Diagnosis not present

## 2023-01-24 ENCOUNTER — Telehealth: Payer: Self-pay | Admitting: Family Medicine

## 2023-01-24 NOTE — Telephone Encounter (Signed)
Prescription Request  01/24/2023  Is this a "Controlled Substance" medicine? Yes  LOV: 08/13/2022  What is the name of the medication or equipment?  ALPRAZolam (XANAX) 0.5 MG tablet   Have you contacted your pharmacy to request a refill? No  She has new insurance and new pharmacy  Which pharmacy would you like this sent to?  [facility] # [place], [place] [address] Phone: [phone] Fax: [phone]    Patient notified that their request is being sent to the clinical staff for review and that they should receive a response within 2 business days.   Please advise at Mobile [phone] (mobile)

## 2023-01-24 NOTE — Telephone Encounter (Signed)
LOV - 08/13/22 NOV - not scheduled RF - 07/01/22 #30/0

## 2023-01-25 MED ORDER — ALPRAZOLAM 0.5 MG PO TABS: 0.2500 mg | ORAL_TABLET | Freq: Every day | ORAL | 0 refills | Status: DC | PRN

## 2023-01-25 NOTE — Telephone Encounter (Signed)
Sent. Thanks.   

## 2023-02-03 DIAGNOSIS — M170 Bilateral primary osteoarthritis of knee: Secondary | ICD-10-CM | POA: Diagnosis not present

## 2023-02-10 DIAGNOSIS — M170 Bilateral primary osteoarthritis of knee: Secondary | ICD-10-CM | POA: Diagnosis not present

## 2023-02-17 DIAGNOSIS — M170 Bilateral primary osteoarthritis of knee: Secondary | ICD-10-CM | POA: Diagnosis not present

## 2023-03-09 DIAGNOSIS — Z1211 Encounter for screening for malignant neoplasm of colon: Secondary | ICD-10-CM | POA: Diagnosis not present

## 2023-03-09 DIAGNOSIS — K5730 Diverticulosis of large intestine without perforation or abscess without bleeding: Secondary | ICD-10-CM | POA: Diagnosis not present

## 2023-03-09 LAB — HM COLONOSCOPY

## 2023-03-17 DIAGNOSIS — M545 Low back pain, unspecified: Secondary | ICD-10-CM | POA: Diagnosis not present

## 2023-03-17 DIAGNOSIS — M7062 Trochanteric bursitis, left hip: Secondary | ICD-10-CM | POA: Diagnosis not present

## 2023-03-28 ENCOUNTER — Other Ambulatory Visit: Payer: Self-pay | Admitting: Family Medicine

## 2023-03-31 DIAGNOSIS — M25562 Pain in left knee: Secondary | ICD-10-CM | POA: Diagnosis not present

## 2023-03-31 DIAGNOSIS — M25561 Pain in right knee: Secondary | ICD-10-CM | POA: Diagnosis not present

## 2023-03-31 DIAGNOSIS — M25552 Pain in left hip: Secondary | ICD-10-CM | POA: Diagnosis not present

## 2023-04-06 DIAGNOSIS — M25552 Pain in left hip: Secondary | ICD-10-CM | POA: Diagnosis not present

## 2023-05-08 ENCOUNTER — Telehealth: Payer: Self-pay | Admitting: Family Medicine

## 2023-05-08 DIAGNOSIS — E279 Disorder of adrenal gland, unspecified: Secondary | ICD-10-CM

## 2023-05-08 DIAGNOSIS — D3502 Benign neoplasm of left adrenal gland: Secondary | ICD-10-CM

## 2023-05-08 NOTE — Telephone Encounter (Signed)
Please contact patient.  Due for follow-up CT imaging regarding adrenal lesion.  This is a 1 year follow-up.  I put in the order for the imaging and she should get a call about scheduling.  Thanks.

## 2023-05-10 NOTE — Telephone Encounter (Signed)
Spoke with patient about f/u CT scan. She will await call to schedule.

## 2023-05-11 DIAGNOSIS — Z961 Presence of intraocular lens: Secondary | ICD-10-CM | POA: Diagnosis not present

## 2023-05-11 DIAGNOSIS — H353132 Nonexudative age-related macular degeneration, bilateral, intermediate dry stage: Secondary | ICD-10-CM | POA: Diagnosis not present

## 2023-05-11 DIAGNOSIS — H04123 Dry eye syndrome of bilateral lacrimal glands: Secondary | ICD-10-CM | POA: Diagnosis not present

## 2023-05-19 ENCOUNTER — Ambulatory Visit
Admission: RE | Admit: 2023-05-19 | Discharge: 2023-05-19 | Disposition: A | Discharge status: Home / Self Care | Payer: Medicare Other | Source: Ambulatory Visit | Attending: Family Medicine | Admitting: Family Medicine

## 2023-05-19 DIAGNOSIS — D3502 Benign neoplasm of left adrenal gland: Secondary | ICD-10-CM | POA: Diagnosis not present

## 2023-05-19 DIAGNOSIS — E279 Disorder of adrenal gland, unspecified: Secondary | ICD-10-CM

## 2023-06-10 DIAGNOSIS — M25552 Pain in left hip: Secondary | ICD-10-CM | POA: Diagnosis not present

## 2023-07-21 ENCOUNTER — Encounter (INDEPENDENT_AMBULATORY_CARE_PROVIDER_SITE_OTHER): Payer: Self-pay

## 2023-08-09 DIAGNOSIS — Z1231 Encounter for screening mammogram for malignant neoplasm of breast: Secondary | ICD-10-CM | POA: Diagnosis not present

## 2023-08-09 LAB — HM MAMMOGRAPHY

## 2023-08-10 ENCOUNTER — Encounter: Payer: Self-pay | Admitting: Family Medicine

## 2023-08-15 DIAGNOSIS — R922 Inconclusive mammogram: Secondary | ICD-10-CM | POA: Diagnosis not present

## 2023-08-15 DIAGNOSIS — N6489 Other specified disorders of breast: Secondary | ICD-10-CM | POA: Diagnosis not present

## 2023-08-17 ENCOUNTER — Ambulatory Visit (INDEPENDENT_AMBULATORY_CARE_PROVIDER_SITE_OTHER): Payer: Medicare Other

## 2023-08-17 VITALS — Ht 67.0 in | Wt 180.0 lb

## 2023-08-17 DIAGNOSIS — Z0000 Encounter for general adult medical examination without abnormal findings: Secondary | ICD-10-CM

## 2023-08-17 NOTE — Progress Notes (Signed)
Subjective:   Dana Harding is a 72 y.o. female who presents for Medicare Annual (Subsequent) preventive examination.  Visit Complete: Virtual  I connected with  Dana Harding on 08/17/23 by a audio enabled telemedicine application and verified that I am speaking with the correct person using two identifiers.  Patient Location: Home  Provider Location: Home Office  I discussed the limitations of evaluation and management by telemedicine. The patient expressed understanding and agreed to proceed.  Patient Medicare AWV questionnaire was completed by the patient on 08/17/2023; I have confirmed that all information answered by patient is correct and no changes since this date.  Review of Systems    Vital Signs: Unable to obtain new vitals due to this being a telehealth visit.  Cardiac Risk Factors include: advanced age (>40men, >43 women)     Objective:    Today's Vitals   08/17/23 0932  Weight: 180 lb (81.6 kg)  Height: 5\' 7"  (1.702 m)   Body mass index is 28.19 kg/m.     08/17/2023    9:40 AM 08/05/2022    9:06 AM 12/27/2018    8:45 AM 02/14/2013    8:42 AM  Advanced Directives  Does Patient Have a Medical Advance Directive? Yes No Yes Patient would not like information  Type of Advance Directive Healthcare Power of [place];Living will  Healthcare Power of [place];Living will   Copy of Healthcare Power of Attorney in Chart? No - copy requested  No - copy requested   Would patient like information on creating a medical advance directive?  No - Patient declined      Current Medications (verified) Outpatient Encounter Medications as of 08/17/2023  Medication Sig   ALPRAZolam (XANAX) 0.5 MG tablet Take 0.5-1 tablets (0.25-0.5 mg total) by mouth daily as needed for anxiety.   Ascorbic Acid (VITAMIN C) 1000 MG tablet Take 1,000 mg by mouth daily.   azelastine (ASTELIN) 0.1 % nasal spray Place 1-2 sprays into both nostrils 2 (two) times daily as needed for rhinitis.   B Complex  Vitamins (VITAMIN B COMPLEX PO) Take 1 tablet by mouth 2 (two) times a week.    carboxymethylcellulose (REFRESH PLUS) 0.5 % SOLN Place 1 drop into both eyes in the morning and at bedtime.   Cholecalciferol (VITAMIN D-3) 125 MCG (5000 UT) TABS Take 5,000 Units by mouth daily.   clidinium-chlordiazePOXIDE (LIBRAX) 5-2.5 MG capsule Take 1 capsule by mouth 3 (three) times daily as needed (esophageal spasms).   cyclobenzaprine (FLEXERIL) 10 MG tablet Take 0.5-1 tablets (5-10 mg total) by mouth 3 (three) times daily as needed for muscle spasms.   Diclofenac Sodium 1 % CREA Use as needed up to 4 times a day.   Fluocinolone Acetonide Scalp 0.01 % OIL Apply 1 application topically daily as needed for irritation.   HYDROcodone-acetaminophen (NORCO/VICODIN) 5-325 MG tablet Take 1 tablet by mouth 3 (three) times daily as needed for moderate pain (sedation caution).   meclizine (ANTIVERT) 25 MG tablet Take 0.5-1 tablets (12.5-25 mg total) by mouth 3 (three) times daily as needed for dizziness.   Multiple Vitamins-Minerals (PRESERVISION AREDS 2 PO) Take 1 capsule by mouth daily.    ondansetron (ZOFRAN-ODT) 8 MG disintegrating tablet    oxybutynin (DITROPAN-XL) 10 MG 24 hr tablet TAKE ONE TABLET BY MOUTH ONE TIME DAILY   penciclovir (DENAVIR) 1 % cream    triamcinolone (KENALOG) 0.025 % cream APPLY A THIN LAYER TO THE AFFECTED AREA(S) OF THE NECK QD.   No facility-administered encounter medications  on file as of 08/17/2023.    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   bilateral articular L4-5; per MRI   Family history of malignant neoplasm of breast    Fitting and adjustment of gastric lap band    IBS (irritable bowel syndrome)    Inflammatory arthritis    H/o  elevated ANA, treated with plaquenil   Lumbar foraminal stenosis 02/09/2007   mild; L4-5; per MRI   Mitral regurgitation 03/23/2007   mild   Obesity    Obstructive sleep apnea (adult) (pediatric)    prev used CPAP, resolved and off CPAP as of 2016   Tricuspid regurgitation 03/23/2007   mild to moderate   Past Surgical History:  Procedure Laterality Date   BLEPHAROPLASTY     BREAST REDUCTION SURGERY  2009   CATARACT EXTRACTION Left 01/01/2019   CATARACT EXTRACTION W/ INTRAOCULAR LENS IMPLANT Right 12/11/2018   CHOLECYSTECTOMY  1980's   INGUINAL HERNIA REPAIR     bilateral   LAPAROSCOPIC GASTRIC BANDING  2007   removed   SHOULDER ARTHROSCOPY WITH ROTATOR CUFF REPAIR AND SUBACROMIAL DECOMPRESSION Left 02/22/2013   Procedure: LEFT SHOULDER ARTHROSCOPY WITH SUBACROMIAL DECOMPRESSION DISTAL CLAVICLE RESECTION with ROTATOR CUFF REPAIR  ;  Surgeon: Senaida Lange, MD;  Location: [facility] OR;  Service: Orthopedics;  Laterality: Left;   TONSILLECTOMY AND ADENOIDECTOMY  1960   tummy tuck  1996   Family History  Problem Relation Age of Onset   Breast cancer Mother        < 71   Stroke Father        < 60   Bladder Cancer Sister    Cancer Sister        Neuroendocrine tumor found incidentally in the small intestine   Colon cancer Neg Hx    Social History   Socioeconomic History   Marital status: Single    Spouse name: Not on file   Number of children: 1   Years of education: Not on file   Highest education level: Not on file  Occupational History   Not on file  Tobacco Use   Smoking status: Former    Current packs/day: 0.00    Types: Cigarettes    Quit date: 12/06/1978    Years since quitting: 44.7   Smokeless tobacco: Never  Substance and Sexual Activity   Alcohol use: Yes    Comment: Occasional-weekend   Drug use: No   Sexual activity: Not on file  Other Topics Concern   Not on file  Social History Narrative   Single   1 daughter-family physician   Undergrad at [employer] and law  degree at [employer]; 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   Social Determinants of Health   Financial Resource Strain: Low Risk  (08/17/2023)   Overall Financial Resource Strain (CARDIA)    Difficulty of Paying Living Expenses: Not hard at all  Food Insecurity: No Food Insecurity (08/17/2023)   Hunger Vital Sign    Worried About Running Out of Food in the Last Year: Never true    Ran Out of Food in the Last Year: Never true  Transportation Needs: No Transportation Needs (08/17/2023)   PRAPARE - Transportation    Lack of  Transportation (Medical): No    Lack of Transportation (Non-Medical): No  Physical Activity: Insufficiently Active (08/17/2023)   Exercise Vital Sign    Days of Exercise per Week: 3 days    Minutes of Exercise per Session: 30 min  Stress: No Stress Concern Present (08/17/2023)   [employer] of Occupational Health - Occupational Stress Questionnaire    Feeling of Stress : Not at all  Social Connections: Moderately Isolated (08/17/2023)   Social Connection and Isolation Panel [NHANES]    Frequency of Communication with Friends and Family: More than three times a week    Frequency of Social Gatherings with Friends and Family: More than three times a week    Attends Religious Services: More than 4 times per year    Active Member of [employer] or Organizations: No    Attends Engineer, structural: Never    Marital Status: Never married    Tobacco Counseling Counseling given: Not Answered   Clinical Intake:  Pre-visit preparation completed: Yes  Pain : No/denies pain     Nutritional Risks: None Diabetes: No  How often do you need to have someone help you when you read instructions, pamphlets, or other written materials from your doctor or pharmacy?: 1 - Never  Interpreter Needed?: No  Information entered by :: Renie Ora, LPN   Activities of Daily Living    08/17/2023    9:40 AM  In your present state of health, do you have any difficulty  performing the following activities:  Hearing? 0  Vision? 0  Difficulty concentrating or making decisions? 0  Walking or climbing stairs? 0  Dressing or bathing? 0  Doing errands, shopping? 0  Preparing Food and eating ? N  Using the Toilet? N  In the past six months, have you accidently leaked urine? N  Do you have problems with loss of bowel control? N  Managing your Medications? N  Managing your Finances? N  Housekeeping or managing your Housekeeping? N    Patient Care Team: Joaquim Nam, MD as PCP - General Dasher, Fayrene Fearing, MD (Surgery) Charna Elizabeth, MD as Consulting Physician (Gastroenterology) Dione Booze, Bertram Millard, MD as Consulting Physician (Ophthalmology)  Indicate any recent Medical Services you may have received from other than [facility] providers in the past year (date may be approximate).     Assessment:   This is a routine wellness examination for Dana Harding.  Hearing/Vision screen Vision Screening - Comments:: Wears rx glasses - up to date with routine eye exams with  Dr.Groat    Goals Addressed             This Visit's Progress    DIET - EAT MORE FRUITS AND VEGETABLES   On track      Depression Screen    08/17/2023    9:36 AM 08/05/2022    9:05 AM 08/06/2021    3:50 PM 12/27/2018    8:45 AM  PHQ 2/9 Scores  PHQ - 2 Score 0 0 0 0  PHQ- 9 Score  0 0 0    Fall Risk    08/17/2023    9:34 AM 08/05/2022    9:07 AM 06/01/2022   12:39 PM 01/01/2021   12:46 PM 12/27/2018    8:45 AM  Fall Risk   Falls in the past year? 0 0 0 0 0  Number falls in past yr: 0 0 0 0   Injury with Fall? 0 0 0 0   Risk for fall due to : No  Fall Risks No Fall Risks No Fall Risks    Follow up Falls prevention discussed Falls evaluation completed Falls evaluation completed Falls evaluation completed     MEDICARE RISK AT HOME: Medicare Risk at Home Any stairs in or around the home?: Yes If so, are there any without handrails?: No Home free of loose throw rugs in walkways, pet beds,  electrical cords, etc?: Yes Adequate lighting in your home to reduce risk of falls?: Yes Life alert?: No Use of a cane, walker or w/c?: No Grab bars in the bathroom?: Yes Shower chair or bench in shower?: Yes Elevated toilet seat or a handicapped toilet?: Yes  TIMED UP AND GO:  Was the test performed?  No    Cognitive Function:    12/27/2018    9:49 AM  MMSE - Mini Mental State Exam  Orientation to time 5  Orientation to Place 5  Registration 3  Attention/ Calculation 0  Recall 3  Language- name 2 objects 0  Language- repeat 1  Language- follow 3 step command 3  Language- read & follow direction 0  Write a sentence 0  Copy design 0  Total score 20        08/17/2023    9:41 AM 08/05/2022    9:08 AM  6CIT Screen  What Year? 0 points 0 points  What month? 0 points 0 points  What time? 0 points 0 points  Count back from 20 0 points 0 points  Months in reverse 0 points 0 points  Repeat phrase 0 points 0 points  Total Score 0 points 0 points    Immunizations Immunization History  Administered Date(s) Administered   Fluad Quad(high Dose 65+) 08/09/2019, 09/12/2020, 08/04/2021, 08/13/2022   Influenza Split 10/17/2012   Influenza, Seasonal, Injecte, Preservative Fre 09/05/2015   Influenza,inj,Quad PF,6+ Mos 11/14/2014, 09/05/2015, 11/22/2017, 01/02/2019   Influenza-Unspecified 09/06/2015   PFIZER Comirnaty(Gray Top)Covid-19 Tri-Sucrose Vaccine 01/28/2020, 06/23/2021   PFIZER(Purple Top)SARS-COV-2 Vaccination 01/06/2020, 08/31/2020   PNEUMOCOCCAL CONJUGATE-20 08/04/2021   Pneumococcal Polysaccharide-23 12/06/2008, 01/02/2019   Td 12/07/2007   Tdap 01/02/2019   Zoster Recombinant(Shingrix) 02/07/2019, 05/21/2019   Zoster, Live 03/26/2014    TDAP status: Up to date  Flu Vaccine status: Up to date  Pneumococcal vaccine status: Up to date  Covid-19 vaccine status: Completed vaccines  Qualifies for Shingles Vaccine? Yes   Zostavax completed No   Shingrix  Completed?: No.    Education has been provided regarding the importance of this vaccine. Patient has been advised to call insurance company to determine out of pocket expense if they have not yet received this vaccine. Advised may also receive vaccine at local pharmacy or Health Dept. Verbalized acceptance and understanding.  Screening Tests Health Maintenance  Topic Date Due   INFLUENZA VACCINE  07/07/2023   COVID-19 Vaccine (5 - 2023-24 season) 08/07/2023   MAMMOGRAM  08/08/2024   Medicare Annual Wellness (AWV)  08/16/2024   DTaP/Tdap/Td (3 - Td or Tdap) 01/02/2029   Colonoscopy  03/08/2033   Pneumonia Vaccine 22+ Years old  Completed   DEXA SCAN  Completed   Hepatitis C Screening  Completed   Zoster Vaccines- Shingrix  Completed   HPV VACCINES  Aged Out    Health Maintenance  Health Maintenance Due  Topic Date Due   INFLUENZA VACCINE  07/07/2023   COVID-19 Vaccine (5 - 2023-24 season) 08/07/2023    Colorectal cancer screening: Type of screening: Colonoscopy. Completed 03/09/2023. Repeat every 10 years  Mammogram status: Completed 08/09/2023. Repeat every  year  Bone Density status: Completed 11/15/2018. Results reflect: Bone density results: NORMAL. Repeat every 5 years.  Lung Cancer Screening: (Low Dose CT Chest recommended if Age 105-80 years, 20 pack-year currently smoking OR have quit w/in 15years.) does not qualify.   Lung Cancer Screening Referral: n/a  Additional Screening:  Hepatitis C Screening: does not qualify; Completed 12/27/2018  Vision Screening: Recommended annual ophthalmology exams for early detection of glaucoma and other disorders of the eye. Is the patient up to date with their annual eye exam?  Yes  Who is the provider or what is the name of the office in which the patient attends annual eye exams? Dr.Groat  If pt is not established with a provider, would they like to be referred to a provider to establish care? No .   Dental Screening: Recommended  annual dental exams for proper oral hygiene   Community Resource Referral / Chronic Care Management: CRR required this visit?  No   CCM required this visit?  No     Plan:     I have personally reviewed and noted the following in the patient's chart:   Medical and social history Use of alcohol, tobacco or illicit drugs  Current medications and supplements including opioid prescriptions. Patient is not currently taking opioid prescriptions. Functional ability and status Nutritional status Physical activity Advanced directives List of other physicians Hospitalizations, surgeries, and ER visits in previous 12 months Vitals Screenings to include cognitive, depression, and falls Referrals and appointments  In addition, I have reviewed and discussed with patient certain preventive protocols, quality metrics, and best practice recommendations. A written personalized care plan for preventive services as well as general preventive health recommendations were provided to patient.     Lorrene Reid, LPN   [id number]/41/[id number]   After Visit Summary: (MyChart) Due to this being a telephonic visit, the after visit summary with patients personalized plan was offered to patient via MyChart   Nurse Notes: none

## 2023-08-17 NOTE — Patient Instructions (Signed)
Dana Harding , Thank you for taking time to come for your Medicare Wellness Visit. I appreciate your ongoing commitment to your health goals. Please review the following plan we discussed and let me know if I can assist you in the future.   Referrals/Orders/Follow-Ups/Clinician Recommendations: Aim for 30 minutes of exercise or brisk walking, 6-8 glasses of water, and 5 servings of fruits and vegetables each day.   This is a list of the screening recommended for you and due dates:  Health Maintenance  Topic Date Due   Flu Shot  07/07/2023   COVID-19 Vaccine (5 - 2023-24 season) 08/07/2023   Mammogram  08/08/2024   Medicare Annual Wellness Visit  08/16/2024   DTaP/Tdap/Td vaccine (3 - Td or Tdap) 01/02/2029   Colon Cancer Screening  03/08/2033   Pneumonia Vaccine  Completed   DEXA scan (bone density measurement)  Completed   Hepatitis C Screening  Completed   Zoster (Shingles) Vaccine  Completed   HPV Vaccine  Aged Out    Advanced directives: (Copy Requested) Please bring a copy of your health care power of attorney and living will to the office to be added to your chart at your convenience.  Next Medicare Annual Wellness Visit scheduled for next year: Yes  Insert Preventive Care attachment Insert FALL PREVENTION attachment if needed

## 2023-08-18 ENCOUNTER — Other Ambulatory Visit: Payer: Self-pay

## 2023-08-18 DIAGNOSIS — N6311 Unspecified lump in the right breast, upper outer quadrant: Secondary | ICD-10-CM | POA: Diagnosis not present

## 2023-08-18 DIAGNOSIS — R928 Other abnormal and inconclusive findings on diagnostic imaging of breast: Secondary | ICD-10-CM | POA: Diagnosis not present

## 2023-08-18 DIAGNOSIS — D0511 Intraductal carcinoma in situ of right breast: Secondary | ICD-10-CM | POA: Diagnosis not present

## 2023-08-19 ENCOUNTER — Telehealth: Payer: Self-pay | Admitting: Hematology and Oncology

## 2023-08-19 LAB — SURGICAL PATHOLOGY

## 2023-08-19 NOTE — Telephone Encounter (Signed)
Spoke to patient to confirm upcoming morning [facility] clinic appointment on 9/18, paperwork will be sent via e-mail.   Gave location and time, also informed patient that the surgeon's office would be calling as well to get information from them similar to the packet that they will be receiving so make sure to do both.  Reminded patient that all providers will be coming to the clinic to see them HERE and if they had any questions to not hesitate to reach back out to myself or their navigators.

## 2023-08-22 ENCOUNTER — Encounter: Payer: Self-pay | Admitting: *Deleted

## 2023-08-22 DIAGNOSIS — Z170 Estrogen receptor positive status [ER+]: Secondary | ICD-10-CM | POA: Insufficient documentation

## 2023-08-22 DIAGNOSIS — C50111 Malignant neoplasm of central portion of right female breast: Secondary | ICD-10-CM | POA: Insufficient documentation

## 2023-08-23 ENCOUNTER — Encounter: Payer: Self-pay | Admitting: *Deleted

## 2023-08-23 ENCOUNTER — Other Ambulatory Visit: Payer: Self-pay | Admitting: Family Medicine

## 2023-08-24 ENCOUNTER — Ambulatory Visit: Payer: Self-pay | Admitting: Surgery

## 2023-08-24 ENCOUNTER — Inpatient Hospital Stay (HOSPITAL_BASED_OUTPATIENT_CLINIC_OR_DEPARTMENT_OTHER): Payer: Medicare Other | Admitting: Hematology and Oncology

## 2023-08-24 ENCOUNTER — Inpatient Hospital Stay (HOSPITAL_BASED_OUTPATIENT_CLINIC_OR_DEPARTMENT_OTHER): Payer: Medicare Other | Admitting: Genetic Counselor

## 2023-08-24 ENCOUNTER — Encounter: Payer: Self-pay | Admitting: *Deleted

## 2023-08-24 ENCOUNTER — Ambulatory Visit
Admission: RE | Admit: 2023-08-24 | Discharge: 2023-08-24 | Disposition: A | Discharge status: Home / Self Care | Payer: Medicare Other | Source: Ambulatory Visit | Attending: Radiation Oncology | Admitting: Radiation Oncology

## 2023-08-24 ENCOUNTER — Encounter: Payer: Self-pay | Admitting: Genetic Counselor

## 2023-08-24 ENCOUNTER — Ambulatory Visit: Payer: Medicare Other | Admitting: Physical Therapy

## 2023-08-24 ENCOUNTER — Inpatient Hospital Stay: Payer: Medicare Other | Attending: Hematology and Oncology

## 2023-08-24 VITALS — BP 125/89 | HR 96 | Temp 97.6°F | Resp 18 | Ht 67.0 in | Wt 187.4 lb

## 2023-08-24 DIAGNOSIS — C50111 Malignant neoplasm of central portion of right female breast: Secondary | ICD-10-CM | POA: Diagnosis not present

## 2023-08-24 DIAGNOSIS — Z800 Family history of malignant neoplasm of digestive organs: Secondary | ICD-10-CM

## 2023-08-24 DIAGNOSIS — Z803 Family history of malignant neoplasm of breast: Secondary | ICD-10-CM | POA: Diagnosis not present

## 2023-08-24 DIAGNOSIS — Z170 Estrogen receptor positive status [ER+]: Secondary | ICD-10-CM

## 2023-08-24 DIAGNOSIS — C50911 Malignant neoplasm of unspecified site of right female breast: Secondary | ICD-10-CM

## 2023-08-24 DIAGNOSIS — Z87891 Personal history of nicotine dependence: Secondary | ICD-10-CM

## 2023-08-24 DIAGNOSIS — Z8052 Family history of malignant neoplasm of bladder: Secondary | ICD-10-CM

## 2023-08-24 DIAGNOSIS — Z853 Personal history of malignant neoplasm of breast: Secondary | ICD-10-CM | POA: Diagnosis not present

## 2023-08-24 DIAGNOSIS — C50411 Malignant neoplasm of upper-outer quadrant of right female breast: Secondary | ICD-10-CM | POA: Diagnosis present

## 2023-08-24 DIAGNOSIS — H1131 Conjunctival hemorrhage, right eye: Secondary | ICD-10-CM | POA: Diagnosis not present

## 2023-08-24 LAB — CBC WITH DIFFERENTIAL (CANCER CENTER ONLY)
Abs Immature Granulocytes: 0.03 10*3/uL (ref 0.00–0.07)
Basophils Absolute: 0 10*3/uL (ref 0.0–0.1)
Basophils Relative: 1 %
Eosinophils Absolute: 0.3 10*3/uL (ref 0.0–0.5)
Eosinophils Relative: 4 %
HCT: 38.2 % (ref 36.0–46.0)
Hemoglobin: 12.2 g/dL (ref 12.0–15.0)
Immature Granulocytes: 1 %
Lymphocytes Relative: 21 %
Lymphs Abs: 1.4 10*3/uL (ref 0.7–4.0)
MCH: 27.4 pg (ref 26.0–34.0)
MCHC: 31.9 g/dL (ref 30.0–36.0)
MCV: 85.7 fL (ref 80.0–100.0)
Monocytes Absolute: 0.6 10*3/uL (ref 0.1–1.0)
Monocytes Relative: 9 %
Neutro Abs: 4.3 10*3/uL (ref 1.7–7.7)
Neutrophils Relative %: 64 %
Platelet Count: 277 10*3/uL (ref 150–400)
RBC: 4.46 MIL/uL (ref 3.87–5.11)
RDW: 14 % (ref 11.5–15.5)
WBC Count: 6.7 10*3/uL (ref 4.0–10.5)
nRBC: 0 % (ref 0.0–0.2)

## 2023-08-24 LAB — CMP (CANCER CENTER ONLY)
ALT: 14 U/L (ref 0–44)
AST: 18 U/L (ref 15–41)
Albumin: 4.1 g/dL (ref 3.5–5.0)
Alkaline Phosphatase: 102 U/L (ref 38–126)
Anion gap: 5 (ref 5–15)
BUN: 17 mg/dL (ref 8–23)
CO2: 30 mmol/L (ref 22–32)
Calcium: 9.5 mg/dL (ref 8.9–10.3)
Chloride: 105 mmol/L (ref 98–111)
Creatinine: 1.05 mg/dL — ABNORMAL HIGH (ref 0.44–1.00)
GFR, Estimated: 57 mL/min — ABNORMAL LOW (ref >60)
Glucose, Bld: 122 mg/dL — ABNORMAL HIGH (ref 70–99)
Potassium: 4.4 mmol/L (ref 3.5–5.1)
Sodium: 140 mmol/L (ref 135–145)
Total Bilirubin: 0.5 mg/dL (ref 0.3–1.2)
Total Protein: 7.3 g/dL (ref 6.5–8.1)

## 2023-08-24 LAB — GENETIC SCREENING ORDER

## 2023-08-24 NOTE — Telephone Encounter (Signed)
Refill request for Alprazolam 0.5 mg tabs  LOV - 08/13/22 Next OV - not scheduled Last refill - 01/25/23 #30/0

## 2023-08-24 NOTE — Telephone Encounter (Signed)
Patient is due for AWV; please call to schedule appt with Dr. Para March

## 2023-08-24 NOTE — Progress Notes (Signed)
REFERRING PROVIDER: Rachel Moulds, MD  PRIMARY PROVIDER:  Joaquim Nam, MD  PRIMARY REASON FOR VISIT:  1. Malignant neoplasm of central portion of right breast in female, estrogen receptor positive (HCC)   2. Family history of breast cancer     HISTORY OF PRESENT ILLNESS:   Dana Harding, a 72 y.o. female, was seen for a [place] cancer genetics consultation at the request of Dr. Al Pimple due to a personal and family history of cancer.  Dana Harding presents to clinic today to discuss the possibility of a hereditary predisposition to cancer, to discuss genetic testing, and to further clarify her future cancer risks, as well as potential cancer risks for family members.   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  CANCER HISTORY:  Oncology History  Malignant neoplasm of central portion of right breast in female, estrogen receptor positive (HCC)  08/10/2023 Mammogram   Mammogram showed architectural distortion in the right breast. Diagnostic mammo showed 0.4 cm central lucent architectural distortion in the right breast. Stereotactic biopsy is recommended.   08/18/2023 Pathology Results    1. Breast, right, needle core biopsy, mass retroareolar :       - INVASIVE DUCTAL CARCINOMA, SEE NOTE       - TUBULE FORMATION: SCORE 2       - NUCLEAR PLEOMORPHISM: SCORE 2       - MITOTIC COUNT: SCORE 1       - TOTAL SCORE: 5       - OVERALL GRADE: 1       - LYMPHOVASCULAR INVASION: NOT IDENTIFIED       - CANCER LENGTH: 0.5 CM       - CALCIFICATIONS: NOT IDENTIFIED   Results:  IMMUNOHISTOCHEMICAL AND MORPHOMETRIC ANALYSIS PERFORMED MANUALLY  The tumor cells are NEGATIVE for Her2 (1+).  Estrogen Receptor:  100%, POSITIVE, STRONG STAINING INTENSITY  Progesterone Receptor:  50%, POSITIVE, STRONG STAINING INTENSITY  Proliferation Marker Ki67:  5%    08/22/2023 Initial Diagnosis   Malignant  neoplasm of central portion of right breast in female, estrogen receptor positive (HCC)   08/24/2023 Cancer Staging   Staging form: Breast, AJCC 8th Edition - Clinical stage from 08/24/2023: Stage IA (cT1a, cN0, cM0, G1, ER+, PR+, HER2-) - Signed by Rachel Moulds, MD on 08/24/2023 Stage prefix: Initial diagnosis Histologic grading system: 3 grade system Laterality: Right Staged by: Pathologist and managing physician Stage used in treatment planning: Yes National guidelines used in treatment planning: Yes Type of national guideline used in treatment planning: NCCN     Past Medical History:  Diagnosis Date   Anxiety    Cataract 2020   correcting with surgery   Dizziness and giddiness    intermittent   Esophageal spasm    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   bilateral articular L4-5; per MRI   Family history of malignant neoplasm of breast    Fitting and adjustment of gastric lap band    IBS (irritable bowel syndrome)    Inflammatory arthritis    H/o elevated ANA, treated with plaquenil   Lumbar foraminal stenosis 02/09/2007   mild; L4-5; per MRI   Mitral regurgitation 03/23/2007   mild   Obesity    Obstructive sleep apnea (adult) (pediatric)    prev used CPAP, resolved and off CPAP as of 2016   Tricuspid regurgitation 03/23/2007   mild to moderate  Past Surgical History:  Procedure Laterality Date   BLEPHAROPLASTY     BREAST REDUCTION SURGERY  2009   CATARACT EXTRACTION Left 01/01/2019   CATARACT EXTRACTION W/ INTRAOCULAR LENS IMPLANT Right 12/11/2018   CHOLECYSTECTOMY  1980's   INGUINAL HERNIA REPAIR     bilateral   LAPAROSCOPIC GASTRIC BANDING  2007   removed   SHOULDER ARTHROSCOPY WITH ROTATOR CUFF REPAIR AND SUBACROMIAL DECOMPRESSION Left 02/22/2013   Procedure: LEFT SHOULDER ARTHROSCOPY WITH SUBACROMIAL DECOMPRESSION DISTAL CLAVICLE RESECTION with ROTATOR CUFF REPAIR  ;  Surgeon: Senaida Lange, MD;  Location: [facility] OR;  Service: Orthopedics;   Laterality: Left;   TONSILLECTOMY AND ADENOIDECTOMY  1960   tummy tuck  1996    Social History   Socioeconomic History   Marital status: Single    Spouse name: Not on file   Number of children: 1   Years of education: Not on file   Highest education level: Not on file  Occupational History   Not on file  Tobacco Use   Smoking status: Former    Current packs/day: 0.00    Types: Cigarettes    Quit date: 12/06/1978    Years since quitting: 44.7   Smokeless tobacco: Never  Substance and Sexual Activity   Alcohol use: Yes    Comment: Occasional-weekend   Drug use: No   Sexual activity: Not on file  Other Topics Concern   Not on file  Social History Narrative   Single   1 daughter-family physician   Undergrad at [employer] and law degree at [employer]; 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   Social Determinants of Health   Financial Resource Strain: Low Risk  (08/17/2023)   Overall Financial Resource Strain (CARDIA)    Difficulty of Paying Living Expenses: Not hard at all  Food Insecurity: No Food Insecurity (08/17/2023)   Hunger Vital Sign    Worried About Running Out of Food in the Last Year: Never true    Ran Out of Food in the Last Year: Never true  Transportation Needs: No Transportation Needs (08/17/2023)   PRAPARE - Administrator, Civil Service (Medical): No    Lack of Transportation (Non-Medical): No  Physical Activity: Insufficiently Active (08/17/2023)   Exercise Vital Sign    Days of Exercise per Week: 3 days    Minutes of Exercise per Session: 30 min  Stress: No Stress Concern Present (08/17/2023)   [employer] of Occupational Health - Occupational Stress Questionnaire    Feeling of Stress : Not at all  Social Connections: Moderately Isolated (08/17/2023)   Social Connection and Isolation Panel [NHANES]    Frequency of Communication with Friends and Family: More than three times a week    Frequency of Social Gatherings with Friends and Family: More than  three times a week    Attends Religious Services: More than 4 times per year    Active Member of [employer] or Organizations: No    Attends Banker Meetings: Never    Marital Status: Never married     FAMILY HISTORY:  We obtained a detailed, 4-generation family history.  Significant diagnoses are listed below: Family History  Problem Relation Age of Onset   Breast cancer Mother 44       bilateral   Stroke Father        < 45   Bladder Cancer Sister 27   Cancer Sister 44       Neuroendocrine tumor found incidentally  in the small intestine   Colon cancer Paternal Uncle      Dana Harding's sister was diagnosed with bladder cancer at age 44, shortly after her diagnosis she was incidentally diagnosed with a neuroendocrine tumor of the small intestine at age 38. Dana Harding mother was diagnosed with bilateral breast cancer at age 61, she died at age 47. Her paternal uncle was diagnosed with colon cancer, he is deceased. Dana Harding is unaware of previous family history of genetic testing for hereditary cancer risks. There is no reported Ashkenazi Jewish ancestry.   GENETIC COUNSELING ASSESSMENT: Dana Harding is a 72 y.o. female with a personal and family history of cancer which is somewhat suggestive of a hereditary predisposition to cancer. We, therefore, discussed and recommended the following at today's visit.   DISCUSSION: We discussed that 5 - 10% of cancer is hereditary, with most cases of breast cancer associated with BRCA1/2.  There are other genes that can be associated with hereditary breast cancer syndromes.  We discussed that testing is beneficial for several reasons including knowing how to follow individuals after completing their treatment, identifying whether potential treatment options would be beneficial, and understanding if other family members could be at risk for cancer and allowing them to undergo genetic testing.   We reviewed the characteristics, features and  inheritance patterns of hereditary cancer syndromes. We also discussed genetic testing, including the appropriate family members to test, the process of testing, insurance coverage and turn-around-time for results. We discussed the implications of a negative, positive, carrier and/or variant of uncertain significant result.  Dana Harding elected to have [facility] CancerNext-Expanded Panel. The CancerNext-Expanded gene panel offered by [facility] and includes sequencing, rearrangement, and RNA analysis for the following 71 genes: AIP, ALK, APC, ATM, AXIN2, BAP1, BARD1, BMPR1A, BRCA1, BRCA2, BRIP1, CDC73, CDH1, CDK4, CDKN1B, CDKN2A, CHEK2, CTNNA1, DICER1, FH, FLCN, KIF1B, LZTR1, MAX, MEN1, MET, MLH1, MSH2, MSH3, MSH6, MUTYH, NF1, NF2, NTHL1, PALB2, PHOX2B, PMS2, POT1, PRKAR1A, PTCH1, PTEN, RAD51C, RAD51D, RB1, RET, SDHA, SDHAF2, SDHB, SDHC, SDHD, SMAD4, SMARCA4, SMARCB1, SMARCE1, STK11, SUFU, TMEM127, TP53, TSC1, TSC2, and VHL (sequencing and deletion/duplication); EGFR, EGLN1, HOXB13, KIT, MITF, PDGFRA, POLD1, and POLE (sequencing only); EPCAM and GREM1 (deletion/duplication only).   Based on Dana Harding's personal and family history of cancer, she meets medical criteria for genetic testing. Despite that she meets criteria, she may still have an out of pocket cost. We discussed that if her out of pocket cost for testing is over $100, the laboratory should contact them to discuss self-pay prices, patient pay assistance programs, if applicable, and other billing options.  PLAN: After considering the risks, benefits, and limitations, Dana Harding provided informed consent to pursue genetic testing and the blood sample was sent to [facility] for analysis of the CancerNext-Expanded Panel. Results should be available within approximately 2-3 weeks' time, at which point they will be disclosed by telephone to Dana Harding, as will any additional recommendations warranted by these results. Dana Harding will receive a  summary of her genetic counseling visit and a copy of her results once available. This information will also be available in Epic.   Dana Harding questions were answered to her satisfaction today. Our contact information was provided should additional questions or concerns arise. Thank you for the referral and allowing [place] to share in the care of your patient.   Lalla Brothers, MS, [facility] Genetic Counselor [email or web address] (P) [phone]  The patient was seen for a total of 20 minutes in face-to-face genetic  counseling.  The patient was seen alone.  Drs. Pamelia Hoit and/or Mosetta Putt were available to discuss this case as needed.   _______________________________________________________________________ For Office Staff:  Number of people involved in session: 1 Was an Intern/ student involved with case: no

## 2023-08-24 NOTE — Assessment & Plan Note (Addendum)
This is a very pleasant 72 yr old female patient with newly diagnosed right breast IDC, Grade 1, Er 100% PR 50%, Her 2 neg, Ki 67 of 5 % referred to medical oncology for further recommendations.  Assessment and Plan    Stage 1 Invasive Ductal Carcinoma Grade 1, ER 100% positive, PR 50% positive, HER2 negative. Low proliferation index (5%). Discussed the plan for lumpectomy and potential for adjuvant therapy based on post-operative pathology. - Proceed with lumpectomy as planned by Dr. Luisa Hart - If post-operative pathology reveals larger tumor size, consider Oncotype testing to assess need for chemotherapy. - Plan for adjuvant radiation therapy post-lumpectomy, unless patient is over 70 and tumor remains small and low grade. - Initiate anti-estrogen therapy post-radiation, considering Anastrozole, Letrozole, or Exemestane for 5 years. Monitor for side effects including hot flashes, vaginal dryness, aches/pains, and bone density loss. - Recommend weight-bearing exercises, Vitamin D and Calcium supplementation for bone health. - Follow-up post-surgery to discuss further treatment plan.  General Health Maintenance - Up-to-date with colonoscopy. - Due for bone density scan next month. - Regular mammograms.

## 2023-08-24 NOTE — Progress Notes (Signed)
[facility] CONSULT NOTE  Patient Care Team: Joaquim Nam, MD as PCP - General Dasher, Fayrene Fearing, MD (Surgery) Charna Elizabeth, MD as Consulting Physician (Gastroenterology) Dione Booze, Bertram Millard, MD as Consulting Physician (Ophthalmology) Rachel Moulds, MD as Consulting Physician (Hematology and Oncology) Harriette Bouillon, MD as Consulting Physician (General Surgery) Antony Blackbird, MD as Consulting Physician (Radiation Oncology) Pershing Proud, RN as Oncology Nurse Navigator Donnelly Angelica, RN as Oncology Nurse Navigator  CHIEF COMPLAINTS/PURPOSE OF CONSULTATION:  Newly diagnosed breast cancer  HISTORY OF PRESENTING ILLNESS:  Dana Harding 72 y.o. female is here because of recent diagnosis of right breast IDC  I reviewed her records extensively and collaborated the history with the patient.  SUMMARY OF ONCOLOGIC HISTORY: Oncology History  Malignant neoplasm of central portion of right breast in female, estrogen receptor positive (HCC)  08/10/2023 Mammogram   Mammogram showed architectural distortion in the right breast. Diagnostic mammo showed 0.4 cm central lucent architectural distortion in the right breast. Stereotactic biopsy is recommended.   08/18/2023 Pathology Results    1. Breast, right, needle core biopsy, mass retroareolar :       - INVASIVE DUCTAL CARCINOMA, SEE NOTE       - TUBULE FORMATION: SCORE 2       - NUCLEAR PLEOMORPHISM: SCORE 2       - MITOTIC COUNT: SCORE 1       - TOTAL SCORE: 5       - OVERALL GRADE: 1       - LYMPHOVASCULAR INVASION: NOT IDENTIFIED       - CANCER LENGTH: 0.5 CM       - CALCIFICATIONS: NOT IDENTIFIED   Results:  IMMUNOHISTOCHEMICAL AND MORPHOMETRIC ANALYSIS PERFORMED MANUALLY  The tumor cells are NEGATIVE for Her2 (1+).  Estrogen Receptor:  100%, POSITIVE, STRONG STAINING INTENSITY  Progesterone Receptor:  50%, POSITIVE, STRONG STAINING INTENSITY  Proliferation Marker Ki67:  5%    08/22/2023 Initial Diagnosis    Malignant neoplasm of central portion of right breast in female, estrogen receptor positive (HCC)    Discussed the use of AI scribe software for clinical note transcription with the patient, who gave verbal consent to proceed.  History of Present Illness   The patient, with a recent diagnosis of invasive ductal carcinoma, presents for further discussion of her condition and treatment options. She was previously informed that her cancer is Stage 1 and is aware that she will require either a lumpectomy or mastectomy. She expresses a preference for a lumpectomy, as previously discussed with Dr. Luisa Hart. She has a family history of breast cancer, with her mother having passed away from metastatic breast cancer. She also has a sister who had bladder cancer and neuroendocrine tumors in her intestines. The patient is a former smoker, having quit in the 1970s, and consumes alcohol occasionally. She has a daughter who is a family Conservation officer, nature.      MEDICAL HISTORY:  Past Medical History:  Diagnosis Date   Anxiety    Cataract 2020   correcting with surgery   Dizziness and giddiness    intermittent   Esophageal spasm    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   bilateral articular L4-5; per MRI   Family history of malignant neoplasm of breast    Fitting and adjustment of gastric lap band    IBS (irritable bowel syndrome)    Inflammatory arthritis    H/o elevated ANA, treated with plaquenil   Lumbar foraminal stenosis 02/09/2007  mild; L4-5; per MRI   Mitral regurgitation 03/23/2007   mild   Obesity    Obstructive sleep apnea (adult) (pediatric)    prev used CPAP, resolved and off CPAP as of 2016   Tricuspid regurgitation 03/23/2007   mild to moderate    SURGICAL HISTORY: Past Surgical History:  Procedure Laterality Date   BLEPHAROPLASTY     BREAST REDUCTION SURGERY  2009   CATARACT EXTRACTION Left 01/01/2019   CATARACT EXTRACTION W/ INTRAOCULAR LENS IMPLANT Right  12/11/2018   CHOLECYSTECTOMY  1980's   INGUINAL HERNIA REPAIR     bilateral   LAPAROSCOPIC GASTRIC BANDING  2007   removed   SHOULDER ARTHROSCOPY WITH ROTATOR CUFF REPAIR AND SUBACROMIAL DECOMPRESSION Left 02/22/2013   Procedure: LEFT SHOULDER ARTHROSCOPY WITH SUBACROMIAL DECOMPRESSION DISTAL CLAVICLE RESECTION with ROTATOR CUFF REPAIR  ;  Surgeon: Senaida Lange, MD;  Location: [facility] OR;  Service: Orthopedics;  Laterality: Left;   TONSILLECTOMY AND ADENOIDECTOMY  1960   tummy tuck  1996    SOCIAL HISTORY: Social History   Socioeconomic History   Marital status: Single    Spouse name: Not on file   Number of children: 1   Years of education: Not on file   Highest education level: Not on file  Occupational History   Not on file  Tobacco Use   Smoking status: Former    Current packs/day: 0.00    Types: Cigarettes    Quit date: 12/06/1978    Years since quitting: 44.7   Smokeless tobacco: Never  Substance and Sexual Activity   Alcohol use: Yes    Comment: Occasional-weekend   Drug use: No   Sexual activity: Not on file  Other Topics Concern   Not on file  Social History Narrative   Single   1 daughter-family physician   Undergrad at [employer] and law degree at [employer]; 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   Social Determinants of Health   Financial Resource Strain: Low Risk  (08/17/2023)   Overall Financial Resource Strain (CARDIA)    Difficulty of Paying Living Expenses: Not hard at all  Food Insecurity: No Food Insecurity (08/17/2023)   Hunger Vital Sign    Worried About Running Out of Food in the Last Year: Never true    Ran Out of Food in the Last Year: Never true  Transportation Needs: No Transportation Needs (08/17/2023)   PRAPARE - Administrator, Civil Service (Medical): No    Lack of Transportation (Non-Medical): No  Physical Activity: Insufficiently Active (08/17/2023)   Exercise Vital Sign    Days of Exercise per Week: 3 days    Minutes of Exercise per  Session: 30 min  Stress: No Stress Concern Present (08/17/2023)   [employer] of Occupational Health - Occupational Stress Questionnaire    Feeling of Stress : Not at all  Social Connections: Moderately Isolated (08/17/2023)   Social Connection and Isolation Panel [NHANES]    Frequency of Communication with Friends and Family: More than three times a week    Frequency of Social Gatherings with Friends and Family: More than three times a week    Attends Religious Services: More than 4 times per year    Active Member of [employer] or Organizations: No    Attends Banker Meetings: Never    Marital Status: Never married  Intimate Partner Violence: Not At Risk (08/17/2023)   Humiliation, Afraid, Rape, and Kick questionnaire    Fear of Current  or Ex-Partner: No    Emotionally Abused: No    Physically Abused: No    Sexually Abused: No    FAMILY HISTORY: Family History  Problem Relation Age of Onset   Breast cancer Mother        < 68   Stroke Father        < 60   Bladder Cancer Sister    Cancer Sister        Neuroendocrine tumor found incidentally in the small intestine   Colon cancer Neg Hx     ALLERGIES:  is allergic to latex, aspirin, and prilosec [omeprazole].  MEDICATIONS:  Current Outpatient Medications  Medication Sig Dispense Refill   ALPRAZolam (XANAX) 0.5 MG tablet Take 0.5-1 tablets (0.25-0.5 mg total) by mouth daily as needed for anxiety. 30 tablet 0   Ascorbic Acid (VITAMIN C) 1000 MG tablet Take 1,000 mg by mouth daily.     HYDROcodone-acetaminophen (NORCO/VICODIN) 5-325 MG tablet Take 1 tablet by mouth 3 (three) times daily as needed for moderate pain (sedation caution). 15 tablet 0   azelastine (ASTELIN) 0.1 % nasal spray Place 1-2 sprays into both nostrils 2 (two) times daily as needed for rhinitis. 30 mL 12   B Complex Vitamins (VITAMIN B COMPLEX PO) Take 1 tablet by mouth 2 (two) times a week.      carboxymethylcellulose (REFRESH PLUS) 0.5 % SOLN Place  1 drop into both eyes in the morning and at bedtime.     Cholecalciferol (VITAMIN D-3) 125 MCG (5000 UT) TABS Take 5,000 Units by mouth daily.     clidinium-chlordiazePOXIDE (LIBRAX) 5-2.5 MG capsule Take 1 capsule by mouth 3 (three) times daily as needed (esophageal spasms). 60 capsule 1   cyclobenzaprine (FLEXERIL) 10 MG tablet Take 0.5-1 tablets (5-10 mg total) by mouth 3 (three) times daily as needed for muscle spasms. 60 tablet 1   Diclofenac Sodium 1 % CREA Use as needed up to 4 times a day.     Fluocinolone Acetonide Scalp 0.01 % OIL Apply 1 application topically daily as needed for irritation.     meclizine (ANTIVERT) 25 MG tablet Take 0.5-1 tablets (12.5-25 mg total) by mouth 3 (three) times daily as needed for dizziness. 30 tablet 0   Multiple Vitamins-Minerals (PRESERVISION AREDS 2 PO) Take 1 capsule by mouth daily.      ondansetron (ZOFRAN-ODT) 8 MG disintegrating tablet      oxybutynin (DITROPAN-XL) 10 MG 24 hr tablet TAKE ONE TABLET BY MOUTH ONE TIME DAILY 90 tablet 0   penciclovir (DENAVIR) 1 % cream      triamcinolone (KENALOG) 0.025 % cream APPLY A THIN LAYER TO THE AFFECTED AREA(S) OF THE NECK QD.     No current facility-administered medications for this visit.    REVIEW OF SYSTEMS:   Constitutional: Denies fevers, chills or abnormal night sweats Eyes: Denies blurriness of vision, double vision or watery eyes Ears, nose, mouth, throat, and face: Denies mucositis or sore throat Respiratory: Denies cough, dyspnea or wheezes Cardiovascular: Denies palpitation, chest discomfort or lower extremity swelling Gastrointestinal:  Denies nausea, heartburn or change in bowel habits Skin: Denies abnormal skin rashes Lymphatics: Denies new lymphadenopathy or easy bruising Neurological:Denies numbness, tingling or new weaknesses Behavioral/Psych: Mood is stable, no new changes  Breast: Denies any palpable lumps or discharge All other systems were reviewed with the patient and are  negative.  PHYSICAL EXAMINATION: ECOG PERFORMANCE STATUS: 0 - Asymptomatic  Vitals:   08/24/23 0934  BP: 125/89  Pulse: 96  Resp: 18  Temp: 97.6 F (36.4 C)  SpO2: 100%   Filed Weights   08/24/23 0934  Weight: 187 lb 6.4 oz (85 kg)    GENERAL:alert, no distress and comfortable SKIN: skin color, texture, turgor are normal, no rashes or significant lesions EYES: normal, conjunctiva are pink and non-injected, sclera clear OROPHARYNX:no exudate, no erythema and lips, buccal mucosa, and tongue normal  NECK: supple, thyroid normal size, non-tender, without nodularity LYMPH:  no palpable lymphadenopathy in the cervical, axillary or inguinal LUNGS: clear to auscultation and percussion with normal breathing effort HEART: regular rate & rhythm and no murmurs and no lower extremity edema ABDOMEN:abdomen soft, non-tender and normal bowel sounds Musculoskeletal:no cyanosis of digits and no clubbing  PSYCH: alert & oriented x 3 with fluent speech NEURO: no focal motor/sensory deficits BREAST: Bilateral breast inspected and palpated.  Right breast with postbiopsy changes, no mass, nipple changes.  No regional adenopathy.  Left breast   LABORATORY DATA:  I have reviewed the data as listed Lab Results  Component Value Date   WBC 6.7 08/24/2023   HGB 12.2 08/24/2023   HCT 38.2 08/24/2023   MCV 85.7 08/24/2023   PLT 277 08/24/2023   Lab Results  Component Value Date   NA 140 08/24/2023   K 4.4 08/24/2023   CL 105 08/24/2023   CO2 30 08/24/2023    RADIOGRAPHIC STUDIES: I have personally reviewed the radiological reports and agreed with the findings in the report.  ASSESSMENT AND PLAN:  Malignant neoplasm of central portion of right breast in female, estrogen receptor positive (HCC) This is a very pleasant 73 yr old female patient with newly diagnosed right breast IDC, Grade 1, Er 100% PR 50%, Her 2 neg, Ki 67 of 5 % referred to medical oncology for further  recommendations.  Assessment and Plan    Stage 1 Invasive Ductal Carcinoma Grade 1, ER 100% positive, PR 50% positive, HER2 negative. Low proliferation index (5%). Discussed the plan for lumpectomy and potential for adjuvant therapy based on post-operative pathology. - Proceed with lumpectomy as planned by Dr. Luisa Hart - If post-operative pathology reveals larger tumor size, consider Oncotype testing to assess need for chemotherapy. - Plan for adjuvant radiation therapy post-lumpectomy, unless patient is over 70 and tumor remains small and low grade. - Initiate anti-estrogen therapy post-radiation, considering Anastrozole, Letrozole, or Exemestane for 5 years. Monitor for side effects including hot flashes, vaginal dryness, aches/pains, and bone density loss. - Recommend weight-bearing exercises, Vitamin D and Calcium supplementation for bone health. - Follow-up post-surgery to discuss further treatment plan.  General Health Maintenance - Up-to-date with colonoscopy. - Due for bone density scan next month. - Regular mammograms.       All questions were answered. The patient knows to call the clinic with any problems, questions or concerns.    Rachel Moulds, MD 08/24/23

## 2023-08-24 NOTE — Progress Notes (Signed)
Radiation Oncology         [phone] ________________________________  Multidisciplinary Breast Oncology Clinic [facility]) Initial Outpatient Consultation  Name: Dana Harding MRN: [id number]  Date: 08/24/2023  DOB: Jan 09, 1951  IO:[id number], Dwana Curd, MD  Harriette Bouillon, MD   REFERRING PHYSICIAN: Harriette Bouillon, MD  DIAGNOSIS: The encounter diagnosis was Malignant neoplasm of central portion of right breast in female, estrogen receptor positive (HCC).  Stage IA (cT1a, cN0, cM0) Central Right Breast, Invasive Ductal Carcinoma, ER+ / PR+ / Her2-, Grade 1    ICD-10-CM   1. Malignant neoplasm of central portion of right breast in female, estrogen receptor positive (HCC)  C50.111    Z17.0       HISTORY OF PRESENT ILLNESS::Dana Harding is a 72 y.o. female who is presenting to the office today for evaluation of her newly diagnosed breast cancer. She is accompanied by herself. She is doing well overall.   She had routine screening mammography on 08/09/23 showing a possible abnormality in the right breast. She underwent right breast diagnostic mammography with tomography at [facility] on 08/15/23 a 4 mm distortion in the upper outer right breast.  Biopsy of the retroareolar right breast on 08/18/23 showed: grade 1 invasive ductal carcinoma measuring 5 mm in the greatest linear extent of the sample. Prognostic indicators significant for: estrogen receptor, 100% positive and progesterone receptor, 50% positive, both with strong staining intensity. Proliferation marker Ki67 at 5%. HER2 negative.  Menarche: 72 years old Age at first live birth: 72 years old GP: 1 LMP: about 30 years ago Contraceptive: never used  HRT: reports using a form of HRT for less than a year    The patient was referred today for presentation in the multidisciplinary conference.  Radiology studies and pathology slides were presented there for review and discussion of treatment options.  A consensus was discussed regarding  potential next steps.  PREVIOUS RADIATION THERAPY: No  PAST MEDICAL HISTORY:  Past Medical History:  Diagnosis Date   Anxiety    Cataract 2020   correcting with surgery   Dizziness and giddiness    intermittent   Esophageal spasm    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   bilateral articular L4-5; per MRI   Family history of malignant neoplasm of breast    Fitting and adjustment of gastric lap band    IBS (irritable bowel syndrome)    Inflammatory arthritis    H/o elevated ANA, treated with plaquenil   Lumbar foraminal stenosis 02/09/2007   mild; L4-5; per MRI   Mitral regurgitation 03/23/2007   mild   Obesity    Obstructive sleep apnea (adult) (pediatric)    prev used CPAP, resolved and off CPAP as of 2016   Tricuspid regurgitation 03/23/2007   mild to moderate    PAST SURGICAL HISTORY: Past Surgical History:  Procedure Laterality Date   BLEPHAROPLASTY     BREAST REDUCTION SURGERY  2009   CATARACT EXTRACTION Left 01/01/2019   CATARACT EXTRACTION W/ INTRAOCULAR LENS IMPLANT Right 12/11/2018   CHOLECYSTECTOMY  1980's   INGUINAL HERNIA REPAIR     bilateral   LAPAROSCOPIC GASTRIC BANDING  2007   removed   SHOULDER ARTHROSCOPY WITH ROTATOR CUFF REPAIR AND SUBACROMIAL DECOMPRESSION Left 02/22/2013   Procedure: LEFT SHOULDER ARTHROSCOPY WITH SUBACROMIAL DECOMPRESSION DISTAL CLAVICLE RESECTION with ROTATOR CUFF REPAIR  ;  Surgeon: Senaida Lange, MD;  Location: [facility] OR;  Service: Orthopedics;  Laterality: Left;   TONSILLECTOMY AND ADENOIDECTOMY  1960   tummy tuck  1996    FAMILY HISTORY:  Family History  Problem Relation Age of Onset   Breast cancer Mother        < 57   Stroke Father        < 60   Bladder Cancer Sister    Cancer Sister        Neuroendocrine tumor found incidentally in the small intestine   Colon cancer Neg Hx     SOCIAL HISTORY:  Social History   Socioeconomic History   Marital status: Single    Spouse name: Not on file   Number of  children: 1   Years of education: Not on file   Highest education level: Not on file  Occupational History   Not on file  Tobacco Use   Smoking status: Former    Current packs/day: 0.00    Types: Cigarettes    Quit date: 12/06/1978    Years since quitting: 44.7   Smokeless tobacco: Never  Substance and Sexual Activity   Alcohol use: Yes    Comment: Occasional-weekend   Drug use: No   Sexual activity: Not on file  Other Topics Concern   Not on file  Social History Narrative   Single   1 daughter-family physician   Undergrad at [employer] and law degree at [employer]; 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   Social Determinants of Health   Financial Resource Strain: Low Risk  (08/17/2023)   Overall Financial Resource Strain (CARDIA)    Difficulty of Paying Living Expenses: Not hard at all  Food Insecurity: No Food Insecurity (08/17/2023)   Hunger Vital Sign    Worried About Running Out of Food in the Last Year: Never true    Ran Out of Food in the Last Year: Never true  Transportation Needs: No Transportation Needs (08/17/2023)   PRAPARE - Administrator, Civil Service (Medical): No    Lack of Transportation (Non-Medical): No  Physical Activity: Insufficiently Active (08/17/2023)   Exercise Vital Sign    Days of Exercise per Week: 3 days    Minutes of Exercise per Session: 30 min  Stress: No Stress Concern Present (08/17/2023)   [employer] of Occupational Health - Occupational Stress Questionnaire    Feeling of Stress : Not at all  Social Connections: Moderately Isolated (08/17/2023)   Social Connection and Isolation Panel [NHANES]    Frequency of Communication with Friends and Family: More than three times a week    Frequency of Social Gatherings with Friends and Family: More than three times a week    Attends Religious Services: More than 4 times per year    Active Member of [employer] or Organizations: No    Attends Banker Meetings: Never    Marital  Status: Never married    ALLERGIES:  Allergies  Allergen Reactions   Latex Itching and Rash   Aspirin Other (See Comments)   Prilosec [Omeprazole]     nausea    MEDICATIONS:  Current Outpatient Medications  Medication Sig Dispense Refill   ALPRAZolam (XANAX) 0.5 MG tablet Take 0.5-1 tablets (0.25-0.5 mg total) by mouth daily as needed for anxiety. 30 tablet 0   Ascorbic Acid (VITAMIN C) 1000 MG tablet Take 1,000 mg by mouth daily.     azelastine (ASTELIN) 0.1 % nasal spray Place 1-2 sprays into both nostrils 2 (two) times daily as needed for rhinitis. 30 mL 12   B Complex Vitamins (VITAMIN B COMPLEX PO) Take  1 tablet by mouth 2 (two) times a week.      carboxymethylcellulose (REFRESH PLUS) 0.5 % SOLN Place 1 drop into both eyes in the morning and at bedtime.     Cholecalciferol (VITAMIN D-3) 125 MCG (5000 UT) TABS Take 5,000 Units by mouth daily.     clidinium-chlordiazePOXIDE (LIBRAX) 5-2.5 MG capsule Take 1 capsule by mouth 3 (three) times daily as needed (esophageal spasms). 60 capsule 1   cyclobenzaprine (FLEXERIL) 10 MG tablet Take 0.5-1 tablets (5-10 mg total) by mouth 3 (three) times daily as needed for muscle spasms. 60 tablet 1   Diclofenac Sodium 1 % CREA Use as needed up to 4 times a day.     Fluocinolone Acetonide Scalp 0.01 % OIL Apply 1 application topically daily as needed for irritation.     HYDROcodone-acetaminophen (NORCO/VICODIN) 5-325 MG tablet Take 1 tablet by mouth 3 (three) times daily as needed for moderate pain (sedation caution). 15 tablet 0   meclizine (ANTIVERT) 25 MG tablet Take 0.5-1 tablets (12.5-25 mg total) by mouth 3 (three) times daily as needed for dizziness. 30 tablet 0   Multiple Vitamins-Minerals (PRESERVISION AREDS 2 PO) Take 1 capsule by mouth daily.      ondansetron (ZOFRAN-ODT) 8 MG disintegrating tablet      oxybutynin (DITROPAN-XL) 10 MG 24 hr tablet TAKE ONE TABLET BY MOUTH ONE TIME DAILY 90 tablet 0   penciclovir (DENAVIR) 1 % cream       triamcinolone (KENALOG) 0.025 % cream APPLY A THIN LAYER TO THE AFFECTED AREA(S) OF THE NECK QD.     No current facility-administered medications for this encounter.    REVIEW OF SYSTEMS: A 10+ POINT REVIEW OF SYSTEMS WAS OBTAINED including neurology, dermatology, psychiatry, cardiac, respiratory, lymph, extremities, GI, GU, musculoskeletal, constitutional, reproductive, HEENT. She did not fill out the provided form.    PHYSICAL EXAM:     08/24/2023  Vitals with BMI   Height 5\' 7"    Weight 187 lbs 6 oz   BMI 29.34   Systolic 125   Diastolic 89   Pulse 96     Lungs are clear to auscultation bilaterally. Heart has regular rate and rhythm. No palpable cervical, supraclavicular, or axillary adenopathy. Abdomen soft, non-tender, normal bowel sounds. Breast: Left breast with no palpable mass, nipple discharge, or bleeding. Right breast with biopsy site in the 11 o'clock position, with no dominant mass, nipple discharge, or bleeding appreciated. She has scars in the inferior aspects of both breasts from a prior breast reduction.   KPS = 100  100 - Normal; no complaints; no evidence of disease. 90   - Able to carry on normal activity; minor signs or symptoms of disease. 80   - Normal activity with effort; some signs or symptoms of disease. 60   - Cares for self; unable to carry on normal activity or to do active work. 60   - Requires occasional assistance, but is able to care for most of his personal needs. 50   - Requires considerable assistance and frequent medical care. 40   - Disabled; requires special care and assistance. 30   - Severely disabled; hospital admission is indicated although death not imminent. 20   - Very sick; hospital admission necessary; active supportive treatment necessary. 10   - Moribund; fatal processes progressing rapidly. 0     - Dead  Karnofsky DA, Abelmann WH, Craver LS and [facility] [phone]) The use of the nitrogen mustards in the palliative treatment of  carcinoma: with particular reference to bronchogenic carcinoma Cancer 1 [id number]  LABORATORY DATA:  Lab Results  Component Value Date   WBC 6.7 08/24/2023   HGB 12.2 08/24/2023   HCT 38.2 08/24/2023   MCV 85.7 08/24/2023   PLT 277 08/24/2023   Lab Results  Component Value Date   NA 140 08/24/2023   K 4.4 08/24/2023   CL 105 08/24/2023   CO2 30 08/24/2023   Lab Results  Component Value Date   ALT 14 08/24/2023   AST 18 08/24/2023   ALKPHOS 102 08/24/2023   BILITOT 0.5 08/24/2023    PULMONARY FUNCTION TEST:   Review Flowsheet        No data to display          RADIOGRAPHY: No results found.    IMPRESSION: Stage IA (cT1a, cN0, cM0) Central Right Breast, Invasive Ductal Carcinoma, ER+ / PR+ / Her2-, Grade 1   Patient will be a good candidate for breast conservation with radiotherapy to the right breast. We discussed the general course of radiation, potential side effects, and toxicities with radiation and the patient is interested in this approach.   Depending on final pathologic findings she may be able to avoid radiation therapy with the understanding of a slightly increased risk of local recurrence,  assuming she proceeds with adjuvant hormonal therapy.  PLAN:  Genetics  Right lumpectomy  +/- adjuvant radiation therapy  Aromatase inhibitor    ------------------------------------------------  Billie Lade, PhD, MD  This document serves as a record of services personally performed by Antony Blackbird, MD. It was created on his behalf by Neena Rhymes, a trained medical scribe. The creation of this record is based on the scribe's personal observations and the provider's statements to them. This document has been checked and approved by the attending provider.

## 2023-08-25 ENCOUNTER — Encounter: Payer: Self-pay | Admitting: Family Medicine

## 2023-08-25 ENCOUNTER — Other Ambulatory Visit: Payer: Self-pay | Admitting: *Deleted

## 2023-08-25 DIAGNOSIS — Z170 Estrogen receptor positive status [ER+]: Secondary | ICD-10-CM

## 2023-08-25 NOTE — Telephone Encounter (Signed)
Patient scheduled for AWV.She said that she has recently had labs done elsewhere,and she will bring those results with her. She did say that she's not sure if what he would want was drawn as well,so she will come fasting for an additional labs that he may need.

## 2023-08-25 NOTE — Progress Notes (Signed)
Error

## 2023-08-30 ENCOUNTER — Other Ambulatory Visit: Payer: Self-pay | Admitting: Family Medicine

## 2023-08-30 MED ORDER — MIRABEGRON ER 25 MG PO TB24: 25.0000 mg | ORAL_TABLET | Freq: Every day | ORAL | 3 refills | Status: DC

## 2023-09-01 ENCOUNTER — Inpatient Hospital Stay: Payer: Medicare Other

## 2023-09-01 ENCOUNTER — Encounter: Payer: Self-pay | Admitting: *Deleted

## 2023-09-01 ENCOUNTER — Telehealth: Payer: Self-pay | Admitting: *Deleted

## 2023-09-01 NOTE — Progress Notes (Signed)
[facility] Clinical Social Work  Clinical Social Work was referred by new patient protocol for assessment of psychosocial needs.  Clinical Social Worker contacted patient by phone to offer support and assess for needs.  CSW introduced herself and Clinical biochemist. CSW reviewed SDOH screenings, no direct needs at this time. Patient reported some mood changes follow dx, however has been using coping skills to manage. CSW provided direct contact information should needs change.  No follow up at this time.   Marguerita Merles, LCSWA Clinical Social Worker [facility]

## 2023-09-01 NOTE — Telephone Encounter (Signed)
Left message for a return phone call to follow up from [facility] 9/18 and assess navigation needs.

## 2023-09-02 ENCOUNTER — Other Ambulatory Visit: Payer: Self-pay | Admitting: Radiation Oncology

## 2023-09-02 ENCOUNTER — Inpatient Hospital Stay
Admission: RE | Admit: 2023-09-02 | Discharge: 2023-09-02 | Disposition: A | Discharge status: Home / Self Care | Payer: Self-pay | Source: Ambulatory Visit | Attending: Radiation Oncology | Admitting: Radiation Oncology

## 2023-09-02 ENCOUNTER — Encounter: Payer: Self-pay | Admitting: Genetic Counselor

## 2023-09-02 ENCOUNTER — Telehealth: Payer: Self-pay | Admitting: Genetic Counselor

## 2023-09-02 DIAGNOSIS — C50111 Malignant neoplasm of central portion of right female breast: Secondary | ICD-10-CM

## 2023-09-02 DIAGNOSIS — Z170 Estrogen receptor positive status [ER+]: Secondary | ICD-10-CM

## 2023-09-02 DIAGNOSIS — Z1379 Encounter for other screening for genetic and chromosomal anomalies: Secondary | ICD-10-CM | POA: Insufficient documentation

## 2023-09-02 NOTE — Telephone Encounter (Signed)
I contacted Ms. Marchant to discuss her genetic testing results. No pathogenic variants were identified in the 71 genes analyzed. Detailed clinic note to follow.  The test report has been scanned into EPIC and is located under the Molecular Pathology section of the Results Review tab.  A portion of the result report is included below for reference.   Lalla Brothers, MS, [facility] Genetic Counselor [email or web address] (P) [phone]

## 2023-09-06 NOTE — Pre-Procedure Instructions (Signed)
Surgical Instructions   Your procedure is scheduled on September 14, 2023. Report to [facility] Main Entrance "A" at 6:30 A.M., then check in with the Admitting office. Any questions or running late day of surgery: call [phone]  Questions prior to your surgery date: call [phone], Monday-Friday, 8am-4pm. If you experience any cold or flu symptoms such as cough, fever, chills, shortness of breath, etc. between now and your scheduled surgery, please notify us at the above number.     Remember:  Do not eat after midnight the night before your surgery   You may drink clear liquids until 5:30 AM the morning of your surgery.   Clear liquids allowed are: Water, Non-Citrus Juices (without pulp), Carbonated Beverages, Clear Tea, Black Coffee Only (NO MILK, CREAM OR POWDERED CREAMER of any kind), and Gatorade.    Take these medicines the morning of surgery with A SIP OF WATER: carboxymethylcellulose (REFRESH PLUS) eye drops mirabegron ER (MYRBETRIQ)    May take these medicines IF NEEDED: ALPRAZolam (XANAX)  cyclobenzaprine (FLEXERIL)  fluticasone (FLONASE) nasal spray  HYDROcodone-acetaminophen (NORCO/VICODIN)  olopatadine (PATADAY) ophthalmic solution  clidinium-chlordiazePOXIDE (LIBRAX)    One week prior to surgery, STOP taking any Aspirin (unless otherwise instructed by your surgeon) Aleve, Naproxen, Ibuprofen, Motrin, Advil, Goody's, BC's, all herbal medications, fish oil, and non-prescription vitamins.                     Do NOT Smoke (Tobacco/Vaping) for 24 hours prior to your procedure.  If you use a CPAP at night, you may bring your mask/headgear for your overnight stay.   You will be asked to remove any contacts, glasses, piercing's, hearing aid's, dentures/partials prior to surgery. Please bring cases for these items if needed.    Patients discharged the day of surgery will not be allowed to drive home, and someone needs to stay with them for 24 hours.  SURGICAL  WAITING ROOM VISITATION Patients may have no more than 2 support people in the waiting area - these visitors may rotate.   Pre-op nurse will coordinate an appropriate time for 1 ADULT support person, who may not rotate, to accompany patient in pre-op.  Children under the age of 17 must have an adult with them who is not the patient and must remain in the main waiting area with an adult.  If the patient needs to stay at the hospital during part of their recovery, the visitor guidelines for inpatient rooms apply.  Please refer to the [facility] website for the visitor guidelines for any additional information.   If you received a COVID test during your pre-op visit  it is requested that you wear a mask when out in public, stay away from anyone that may not be feeling well and notify your surgeon if you develop symptoms. If you have been in contact with anyone that has tested positive in the last 10 days please notify you surgeon.      Pre-operative CHG Bathing Instructions   You can play a key role in reducing the risk of infection after surgery. Your skin needs to be as free of germs as possible. You can reduce the number of germs on your skin by washing with CHG (chlorhexidine gluconate) soap before surgery. CHG is an antiseptic soap that kills germs and continues to kill germs even after washing.   DO NOT use if you have an allergy to chlorhexidine/CHG or antibacterial soaps. If your skin becomes reddened or irritated, stop using the  CHG and notify one of our RNs at [phone].              TAKE A SHOWER THE NIGHT BEFORE SURGERY AND THE DAY OF SURGERY    Please keep in mind the following:  DO NOT shave, including legs and underarms, 48 hours prior to surgery.   You may shave your face before/day of surgery.  Place clean sheets on your bed the night before surgery Use a clean washcloth (not used since being washed) for each shower. DO NOT sleep with pet's night before surgery.  CHG  Shower Instructions:  Wash your face and private area with normal soap. If you choose to wash your hair, wash first with your normal shampoo.  After you use shampoo/soap, rinse your hair and body thoroughly to remove shampoo/soap residue.  Turn the water OFF and apply half the bottle of CHG soap to a CLEAN washcloth.  Apply CHG soap ONLY FROM YOUR NECK DOWN TO YOUR TOES (washing for 3-5 minutes)  DO NOT use CHG soap on face, private areas, open wounds, or sores.  Pay special attention to the area where your surgery is being performed.  If you are having back surgery, having someone wash your back for you may be helpful. Wait 2 minutes after CHG soap is applied, then you may rinse off the CHG soap.  Pat dry with a clean towel  Put on clean pajamas    Additional instructions for the day of surgery: DO NOT APPLY any lotions, deodorants, cologne, or perfumes.   Do not wear jewelry or makeup Do not wear nail polish, gel polish, artificial nails, or any other type of covering on natural nails (fingers and toes) Do not bring valuables to the hospital. [facility] is not responsible for valuables/personal belongings. Put on clean/comfortable clothes.  Please brush your teeth.  Ask your nurse before applying any prescription medications to the skin.

## 2023-09-06 NOTE — Pre-Procedure Instructions (Signed)
Surgical Instructions   Your procedure is scheduled on September 14, 2023. Report to [facility] Main Entrance "A" at 6:30 A.M., then check in with the Admitting office. Any questions or running late day of surgery: call [phone]  Questions prior to your surgery date: call [phone], Monday-Friday, 8am-4pm. If you experience any cold or flu symptoms such as cough, fever, chills, shortness of breath, etc. between now and your scheduled surgery, please notify us at the above number.     Remember:  Do not eat after midnight the night before your surgery   You may drink clear liquids until 5:30 AM the morning of your surgery.   Clear liquids allowed are: Water, Non-Citrus Juices (without pulp), Carbonated Beverages, Clear Tea, Black Coffee Only (NO MILK, CREAM OR POWDERED CREAMER of any kind), and Gatorade.    Take these medicines the morning of surgery with A SIP OF WATER: carboxymethylcellulose (REFRESH PLUS) eye drops mirabegron ER (MYRBETRIQ)    May take these medicines IF NEEDED: ALPRAZolam (XANAX)  cyclobenzaprine (FLEXERIL)  fluticasone (FLONASE) nasal spray  HYDROcodone-acetaminophen (NORCO/VICODIN)  olopatadine (PATADAY) ophthalmic solution    One week prior to surgery, STOP taking any Aspirin (unless otherwise instructed by your surgeon) Aleve, Naproxen, Ibuprofen, Motrin, Advil, Goody's, BC's, all herbal medications, fish oil, and non-prescription vitamins.                     Do NOT Smoke (Tobacco/Vaping) for 24 hours prior to your procedure.  If you use a CPAP at night, you may bring your mask/headgear for your overnight stay.   You will be asked to remove any contacts, glasses, piercing's, hearing aid's, dentures/partials prior to surgery. Please bring cases for these items if needed.    Patients discharged the day of surgery will not be allowed to drive home, and someone needs to stay with them for 24 hours.  SURGICAL WAITING ROOM VISITATION Patients may have  no more than 2 support people in the waiting area - these visitors may rotate.   Pre-op nurse will coordinate an appropriate time for 1 ADULT support person, who may not rotate, to accompany patient in pre-op.  Children under the age of 30 must have an adult with them who is not the patient and must remain in the main waiting area with an adult.  If the patient needs to stay at the hospital during part of their recovery, the visitor guidelines for inpatient rooms apply.  Please refer to the [facility] website for the visitor guidelines for any additional information.   If you received a COVID test during your pre-op visit  it is requested that you wear a mask when out in public, stay away from anyone that may not be feeling well and notify your surgeon if you develop symptoms. If you have been in contact with anyone that has tested positive in the last 10 days please notify you surgeon.      Pre-operative CHG Bathing Instructions   You can play a key role in reducing the risk of infection after surgery. Your skin needs to be as free of germs as possible. You can reduce the number of germs on your skin by washing with CHG (chlorhexidine gluconate) soap before surgery. CHG is an antiseptic soap that kills germs and continues to kill germs even after washing.   DO NOT use if you have an allergy to chlorhexidine/CHG or antibacterial soaps. If your skin becomes reddened or irritated, stop using the CHG and notify  one of our RNs at [phone].              TAKE A SHOWER THE NIGHT BEFORE SURGERY AND THE DAY OF SURGERY    Please keep in mind the following:  DO NOT shave, including legs and underarms, 48 hours prior to surgery.   You may shave your face before/day of surgery.  Place clean sheets on your bed the night before surgery Use a clean washcloth (not used since being washed) for each shower. DO NOT sleep with pet's night before surgery.  CHG Shower Instructions:  Wash your face and  private area with normal soap. If you choose to wash your hair, wash first with your normal shampoo.  After you use shampoo/soap, rinse your hair and body thoroughly to remove shampoo/soap residue.  Turn the water OFF and apply half the bottle of CHG soap to a CLEAN washcloth.  Apply CHG soap ONLY FROM YOUR NECK DOWN TO YOUR TOES (washing for 3-5 minutes)  DO NOT use CHG soap on face, private areas, open wounds, or sores.  Pay special attention to the area where your surgery is being performed.  If you are having back surgery, having someone wash your back for you may be helpful. Wait 2 minutes after CHG soap is applied, then you may rinse off the CHG soap.  Pat dry with a clean towel  Put on clean pajamas    Additional instructions for the day of surgery: DO NOT APPLY any lotions, deodorants, cologne, or perfumes.   Do not wear jewelry or makeup Do not wear nail polish, gel polish, artificial nails, or any other type of covering on natural nails (fingers and toes) Do not bring valuables to the hospital. [facility] is not responsible for valuables/personal belongings. Put on clean/comfortable clothes.  Please brush your teeth.  Ask your nurse before applying any prescription medications to the skin.

## 2023-09-07 ENCOUNTER — Other Ambulatory Visit: Payer: Self-pay

## 2023-09-07 ENCOUNTER — Encounter (HOSPITAL_COMMUNITY)
Admission: RE | Admit: 2023-09-07 | Discharge: 2023-09-07 | Disposition: A | Discharge status: Home / Self Care | Payer: Medicare Other | Source: Ambulatory Visit | Attending: Surgery | Admitting: Surgery

## 2023-09-07 ENCOUNTER — Encounter (HOSPITAL_COMMUNITY): Payer: Self-pay

## 2023-09-07 VITALS — BP 140/87 | HR 77 | Temp 98.4°F | Resp 18 | Ht 67.0 in | Wt 187.7 lb

## 2023-09-07 DIAGNOSIS — I081 Rheumatic disorders of both mitral and tricuspid valves: Secondary | ICD-10-CM | POA: Diagnosis not present

## 2023-09-07 DIAGNOSIS — E669 Obesity, unspecified: Secondary | ICD-10-CM | POA: Diagnosis not present

## 2023-09-07 DIAGNOSIS — I2510 Atherosclerotic heart disease of native coronary artery without angina pectoris: Secondary | ICD-10-CM | POA: Insufficient documentation

## 2023-09-07 DIAGNOSIS — M25569 Pain in unspecified knee: Secondary | ICD-10-CM | POA: Diagnosis not present

## 2023-09-07 DIAGNOSIS — Z87891 Personal history of nicotine dependence: Secondary | ICD-10-CM | POA: Insufficient documentation

## 2023-09-07 DIAGNOSIS — Z01810 Encounter for preprocedural cardiovascular examination: Secondary | ICD-10-CM | POA: Insufficient documentation

## 2023-09-07 DIAGNOSIS — G4733 Obstructive sleep apnea (adult) (pediatric): Secondary | ICD-10-CM | POA: Insufficient documentation

## 2023-09-07 HISTORY — DX: Gastro-esophageal reflux disease without esophagitis: K21.9

## 2023-09-07 NOTE — Progress Notes (Signed)
PCP - Dr. Crawford Givens Cardiologist - Denies  PPM/ICD - Denies Device Orders - n/a Rep Notified - n/a  Chest x-ray - Denies EKG - 09/07/2023 Stress Test - Denies ECHO - 03/23/2007 - Ordered by GI MD Cardiac Cath - Denies  Sleep Study - Pt was +OSA in 2008. Told by MD no longer needed CPAP in 2016.  No DM  Last dose of GLP1 agonist- n/a GLP1 instructions: n/a  Blood Thinner Instructions: n/a Aspirin Instructions: n/a  ERAS Protcol - Clear liquids until 0530 morning of surgery PRE-SURGERY Ensure or G2- n/a  COVID TEST- n/a   Anesthesia review: Yes. Breast seed placement. Heart sounds assess by Antionette Poles, PA-C due to regurgitation on echo in 2008.   Patient denies shortness of breath, fever, cough and chest pain at PAT appointment. Pt denies any respiratory illness/infection in the last two months. 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    All instructions explained to the patient, with a verbal understanding of the material. Patient agrees to go over the instructions while at home for a better understanding. Patient also instructed to self quarantine after being tested for COVID-19. The opportunity to ask questions was provided.

## 2023-09-08 ENCOUNTER — Encounter: Payer: Self-pay | Admitting: Genetic Counselor

## 2023-09-08 ENCOUNTER — Ambulatory Visit: Payer: Self-pay | Admitting: Genetic Counselor

## 2023-09-08 DIAGNOSIS — Z1379 Encounter for other screening for genetic and chromosomal anomalies: Secondary | ICD-10-CM

## 2023-09-08 NOTE — Progress Notes (Signed)
HPI:   Dana Harding was previously seen in the [facility] due to a personal and family history of cancer and concerns regarding a hereditary predisposition to cancer. Please refer to our prior cancer genetics clinic note for more information regarding our discussion, assessment and recommendations, at the time. Dana Harding recent genetic test results were disclosed to her, as were recommendations warranted by these results. These results and recommendations are discussed in more detail below.  CANCER HISTORY:  Oncology History  Malignant neoplasm of central portion of right breast in female, estrogen receptor positive (HCC)  08/10/2023 Mammogram   Mammogram showed architectural distortion in the right breast. Diagnostic mammo showed 0.4 cm central lucent architectural distortion in the right breast. Stereotactic biopsy is recommended.   08/18/2023 Pathology Results    1. Breast, right, needle core biopsy, mass retroareolar :       - INVASIVE DUCTAL CARCINOMA, SEE NOTE       - TUBULE FORMATION: SCORE 2       - NUCLEAR PLEOMORPHISM: SCORE 2       - MITOTIC COUNT: SCORE 1       - TOTAL SCORE: 5       - OVERALL GRADE: 1       - LYMPHOVASCULAR INVASION: NOT IDENTIFIED       - CANCER LENGTH: 0.5 CM       - CALCIFICATIONS: NOT IDENTIFIED   Results:  IMMUNOHISTOCHEMICAL AND MORPHOMETRIC ANALYSIS PERFORMED MANUALLY  The tumor cells are NEGATIVE for Her2 (1+).  Estrogen Receptor:  100%, POSITIVE, STRONG STAINING INTENSITY  Progesterone Receptor:  50%, POSITIVE, STRONG STAINING INTENSITY  Proliferation Marker Ki67:  5%    08/22/2023 Initial Diagnosis   Malignant neoplasm of central portion of right breast in female, estrogen receptor positive (HCC)   08/24/2023 Cancer Staging   Staging form: Breast, AJCC 8th Edition - Clinical stage from 08/24/2023: Stage IA (cT1a, cN0, cM0, G1, ER+, PR+, HER2-) - Signed by Rachel Moulds, MD on 08/24/2023 Stage prefix: Initial  diagnosis Histologic grading system: 3 grade system Laterality: Right Staged by: Pathologist and managing physician Stage used in treatment planning: Yes National guidelines used in treatment planning: Yes Type of national guideline used in treatment planning: NCCN    Genetic Testing   [facility] CancerNext-Expanded Panel+RNA was Negative. Report date is 09/01/2023.   The CancerNext-Expanded gene panel offered by [facility] and includes sequencing, rearrangement, and RNA analysis for the following 71 genes: AIP, ALK, APC, ATM, AXIN2, BAP1, BARD1, BMPR1A, BRCA1, BRCA2, BRIP1, CDC73, CDH1, CDK4, CDKN1B, CDKN2A, CHEK2, CTNNA1, DICER1, FH, FLCN, KIF1B, LZTR1, MAX, MEN1, MET, MLH1, MSH2, MSH3, MSH6, MUTYH, NF1, NF2, NTHL1, PALB2, PHOX2B, PMS2, POT1, PRKAR1A, PTCH1, PTEN, RAD51C, RAD51D, RB1, RET, SDHA, SDHAF2, SDHB, SDHC, SDHD, SMAD4, SMARCA4, SMARCB1, SMARCE1, STK11, SUFU, TMEM127, TP53, TSC1, TSC2, and VHL (sequencing and deletion/duplication); EGFR, EGLN1, HOXB13, KIT, MITF, PDGFRA, POLD1, and POLE (sequencing only); EPCAM and GREM1 (deletion/duplication only).      FAMILY HISTORY:  We obtained a detailed, 4-generation family history.  Significant diagnoses are listed below:      Family History  Problem Relation Age of Onset   Breast cancer Mother 21        bilateral   Stroke Father          < 39   Bladder Cancer Sister 105   Cancer Sister 26        Neuroendocrine tumor found incidentally in the small intestine   Colon cancer Paternal Uncle  Dana Harding sister was diagnosed with bladder cancer at age 34, shortly after her diagnosis she was incidentally diagnosed with a neuroendocrine tumor of the small intestine at age 34. Dana Harding mother was diagnosed with bilateral breast cancer at age 27, she died at age 16. Her paternal uncle was diagnosed with colon cancer, he is deceased. Dana Harding is unaware of previous family history of genetic testing for hereditary cancer risks.  There is no reported Ashkenazi Jewish ancestry.    GENETIC TEST RESULTS:  The [facility] CancerNext-Expanded Panel found no pathogenic mutations.   The CancerNext-Expanded gene panel offered by [facility] and includes sequencing, rearrangement, and RNA analysis for the following 71 genes: AIP, ALK, APC, ATM, AXIN2, BAP1, BARD1, BMPR1A, BRCA1, BRCA2, BRIP1, CDC73, CDH1, CDK4, CDKN1B, CDKN2A, CHEK2, CTNNA1, DICER1, FH, FLCN, KIF1B, LZTR1, MAX, MEN1, MET, MLH1, MSH2, MSH3, MSH6, MUTYH, NF1, NF2, NTHL1, PALB2, PHOX2B, PMS2, POT1, PRKAR1A, PTCH1, PTEN, RAD51C, RAD51D, RB1, RET, SDHA, SDHAF2, SDHB, SDHC, SDHD, SMAD4, SMARCA4, SMARCB1, SMARCE1, STK11, SUFU, TMEM127, TP53, TSC1, TSC2, and VHL (sequencing and deletion/duplication); EGFR, EGLN1, HOXB13, KIT, MITF, PDGFRA, POLD1, and POLE (sequencing only); EPCAM and GREM1 (deletion/duplication only).    The test report has been scanned into EPIC and is located under the Molecular Pathology section of the Results Review tab.  A portion of the result report is included below for reference. Genetic testing reported out on 09/01/2023.       Even though a pathogenic variant was not identified, possible explanations for the cancer in the family may include: There may be no hereditary risk for cancer in the family. The cancers in Dana Harding and/or her family may be due to other genetic or environmental factors. There may be a gene mutation in one of these genes that current testing methods cannot detect, but that chance is small. There could be another gene that has not yet been discovered, or that we have not yet tested, that is responsible for the cancer diagnoses in the family.  It is also possible there is a hereditary cause for the cancer in the family that Dana Harding did not inherit.  Therefore, it is important to remain in touch with cancer genetics in the future so that we can continue to offer Dana Harding the most up to date genetic testing.   ADDITIONAL  GENETIC TESTING:  We discussed with Dana Harding that her genetic testing was fairly extensive.  If there are genes identified to increase cancer risk that can be analyzed in the future, we would be happy to discuss and coordinate this testing at that time.    CANCER SCREENING RECOMMENDATIONS:  Dana Harding test result is considered negative (normal).  This means that we have not identified a hereditary cause for her personal and family history of cancer at this time.   An individual's cancer risk and medical management are not determined by genetic test results alone. Overall cancer risk assessment incorporates additional factors, including personal medical history, family history, and any available genetic information that may result in a personalized plan for cancer prevention and surveillance. Therefore, it is recommended she continue to follow the cancer management and screening guidelines provided by her oncology and primary healthcare provider.  RECOMMENDATIONS FOR FAMILY MEMBERS:   Since she did not inherit a mutation in a cancer predisposition gene included on this panel, her daughter could not have inherited a mutation from her in one of these genes. Individuals in this family might be at some increased risk of  developing cancer, over the general population risk, due to the family history of cancer. We recommend women in this family have a yearly mammogram beginning at age 18, or 31 years younger than the earliest onset of cancer, an annual clinical breast exam, and perform monthly breast self-exams.   FOLLOW-UP:  Cancer genetics is a rapidly advancing field and it is possible that new genetic tests will be appropriate for her and/or her family members in the future. We encouraged her to remain in contact with cancer genetics on an annual basis so we can update her personal and family histories and let her know of advances in cancer genetics that may benefit this family.   Our contact number  was provided. Dana Harding questions were answered to her satisfaction, and she knows she is welcome to call us at anytime with additional questions or concerns.   Dana Brothers, MS, [facility] Genetic Counselor [email or web address] (P) [phone]

## 2023-09-08 NOTE — Anesthesia Preprocedure Evaluation (Addendum)
Anesthesia Evaluation  Patient identified by MRN, date of birth, ID band Patient awake    Reviewed: Allergy & Precautions, NPO status , Patient's Chart, lab work & pertinent test results  History of Anesthesia Complications Negative for: history of anesthetic complications  Airway Mallampati: II  TM Distance: >3 FB Neck ROM: Full   Comment: Previous grade 1 view with MAC 3, easy mask Dental  (+) Dental Advisory Given   Pulmonary neg shortness of breath, sleep apnea , neg COPD, neg recent URI (does not use CPAP), former smoker   Pulmonary exam normal breath sounds clear to auscultation       Cardiovascular (-) hypertension(-) angina (-) Past MI, (-) Cardiac Stents and (-) CABG (-) dysrhythmias + Valvular Problems/Murmurs (TR) MR  Rhythm:Regular Rate:Normal  HLD   Neuro/Psych neg Seizures PSYCHIATRIC DISORDERS Anxiety      Neuromuscular disease (lumbar stenosis)    GI/Hepatic Neg liver ROS,GERD  ,,S/p gastric banding   Endo/Other  negative endocrine ROS    Renal/GU negative Renal ROS     Musculoskeletal  (+) Arthritis ,    Abdominal   Peds  Hematology negative hematology ROS (+) Lab Results      Component                Value               Date                      WBC                      6.7                 08/24/2023                HGB                      12.2                08/24/2023                HCT                      38.2                08/24/2023                MCV                      85.7                08/24/2023                PLT                      277                 08/24/2023              Anesthesia Other Findings   Reproductive/Obstetrics Right breast cancer                             Anesthesia Physical Anesthesia Plan  ASA: 3  Anesthesia Plan: General   Post-op Pain Management: Tylenol PO (pre-op)*   Induction: Intravenous  PONV Risk Score and Plan: 3  and Ondansetron, Dexamethasone  and Treatment may vary due to age or medical condition  Airway Management Planned: LMA  Additional Equipment:   Intra-op Plan:   Post-operative Plan: Extubation in OR  Informed Consent: I have reviewed the patients History and Physical, chart, labs and discussed the procedure including the risks, benefits and alternatives for the proposed anesthesia with the patient or authorized representative who has indicated his/her understanding and acceptance.     Dental advisory given  Plan Discussed with: CRNA and Anesthesiologist  Anesthesia Plan Comments: (Risks of general anesthesia discussed including, but not limited to, sore throat, hoarse voice, chipped/damaged teeth, injury to vocal cords, nausea and vomiting, allergic reactions, lung infection, heart attack, stroke, and death. All questions answered.   PAT note by Antionette Poles, PA-C: 72 year old female with pertinent history including former smoker, obesity s/p laparoscopic gastric banding 2009 (underwent laparoscopic removal of gastric band and port on 06/23/2020 at [facility]), OSA without need for CPAP following weight loss, inflammatory arthritis, esophageal spasm.  She reported that prior to her lap band procedure in 2009 she was required to undergo a cardiac work-up.  This was when she was living in [place].  Results of this testing was dictated into PCP Dr. Joaquim Nam office note on 12/21/10: 06/18/2008 Lexiscan augmented myocardial perfusion study: No scintigraphic evidence of reversible ischemia or infarct. Calculated left ventricular ejection fraction of 48%. 03/23/2007 Echocardiographic Report: Borderline LVEF 50%, without segmental wall motion abnormalities. Mild mitral regurgitation. Mild to moderate tricuspid regurgitation.   I spoke with patient at her PAT appointment.  She reports good functional status, she works out with a Psychologist, educational and performs resistance exercises without cardiopulmonary  complaint.  Aerobic activity is limited somewhat by knee pain, however, she denies any cardiopulmonary limitations.  Denies any history of palpitations, chest pain, presyncope or syncope.  Prior echo in 2008 noted mild mitral regurgitation and mild to moderate tricuspid regurgitation.  On exam today she is well-appearing, in no acute distress.  Auscultation reveals regular rate and rhythm, I do not appreciate a murmur.  Recent labs drawn at cancer center on 08/24/2023, BMP and CBC.  Labs reviewed, unremarkable.  Anticipate she can proceed with surgery as planned barring acute status change.  EKG 09/07/23: NSR.  Rate 68.  )        Anesthesia Quick Evaluation

## 2023-09-08 NOTE — Progress Notes (Signed)
Anesthesia Chart Review:  72 year old female with pertinent history including former smoker, obesity s/p laparoscopic gastric banding 2009 (underwent laparoscopic removal of gastric band and port on 06/23/2020 at [facility]), OSA without need for CPAP following weight loss, inflammatory arthritis, esophageal spasm.  She reported that prior to her lap band procedure in 2009 she was required to undergo a cardiac work-up.  This was when she was living in [place].  Results of this testing was dictated into PCP Dr. Joaquim Nam office note on 12/21/10: 06/18/2008 Lexiscan augmented myocardial perfusion study: No scintigraphic evidence of reversible ischemia or infarct. Calculated left ventricular ejection fraction of 48%. 03/23/2007 Echocardiographic Report: Borderline LVEF 50%, without segmental wall motion abnormalities. Mild mitral regurgitation. Mild to moderate tricuspid regurgitation.   I spoke with patient at her PAT appointment.  She reports good functional status, she works out with a Psychologist, educational and performs resistance exercises without cardiopulmonary complaint.  Aerobic activity is limited somewhat by knee pain, however, she denies any cardiopulmonary limitations.  Denies any history of palpitations, chest pain, presyncope or syncope.  Prior echo in 2008 noted mild mitral regurgitation and mild to moderate tricuspid regurgitation.  On exam today she is well-appearing, in no acute distress.  Auscultation reveals regular rate and rhythm, I do not appreciate a murmur.  Recent labs drawn at cancer center on 08/24/2023, BMP and CBC.  Labs reviewed, unremarkable.  Anticipate she can proceed with surgery as planned barring acute status change.  EKG 09/07/23: NSR.  Rate 68.   Zannie Cove [facility]/Anesthesiology Phone [phone] 09/08/2023 2:10 PM

## 2023-09-13 ENCOUNTER — Other Ambulatory Visit (HOSPITAL_COMMUNITY): Payer: Self-pay

## 2023-09-13 ENCOUNTER — Telehealth: Payer: Self-pay

## 2023-09-13 ENCOUNTER — Telehealth: Payer: Self-pay | Admitting: Family Medicine

## 2023-09-13 DIAGNOSIS — C50911 Malignant neoplasm of unspecified site of right female breast: Secondary | ICD-10-CM | POA: Diagnosis not present

## 2023-09-13 NOTE — Telephone Encounter (Signed)
Patient called in and stated that she is needing a PA for her mirabegron ER (MYRBETRIQ) 25 MG TB24 tablet. Thank you!

## 2023-09-13 NOTE — Telephone Encounter (Signed)
Pharmacy Patient Advocate Encounter   Received notification from Pt Calls Messages that prior authorization for Mirabegron Er 25mg   is required/requested.   Insurance verification completed.   The patient is insured through [facility] .   Per test claim: PA required; PA submitted to [facility] via CoverMyMeds Key/confirmation #/EOC [id number] Status is pending

## 2023-09-14 ENCOUNTER — Ambulatory Visit (HOSPITAL_BASED_OUTPATIENT_CLINIC_OR_DEPARTMENT_OTHER): Payer: Medicare Other | Admitting: Vascular Surgery

## 2023-09-14 ENCOUNTER — Ambulatory Visit (HOSPITAL_COMMUNITY)
Admission: RE | Admit: 2023-09-14 | Discharge: 2023-09-14 | Disposition: A | Discharge status: Home / Self Care | Payer: Medicare Other | Attending: Surgery | Admitting: Surgery

## 2023-09-14 ENCOUNTER — Ambulatory Visit (HOSPITAL_COMMUNITY): Payer: Medicare Other | Admitting: Vascular Surgery

## 2023-09-14 ENCOUNTER — Encounter (HOSPITAL_COMMUNITY): Payer: Self-pay | Admitting: Surgery

## 2023-09-14 ENCOUNTER — Encounter (HOSPITAL_COMMUNITY)
Admission: RE | Disposition: A | Discharge status: Home / Self Care | Payer: Self-pay | Source: Home / Self Care | Attending: Surgery

## 2023-09-14 ENCOUNTER — Other Ambulatory Visit: Payer: Self-pay

## 2023-09-14 DIAGNOSIS — Z9884 Bariatric surgery status: Secondary | ICD-10-CM | POA: Diagnosis not present

## 2023-09-14 DIAGNOSIS — C50111 Malignant neoplasm of central portion of right female breast: Secondary | ICD-10-CM | POA: Diagnosis not present

## 2023-09-14 DIAGNOSIS — C50411 Malignant neoplasm of upper-outer quadrant of right female breast: Secondary | ICD-10-CM | POA: Insufficient documentation

## 2023-09-14 DIAGNOSIS — Z1721 Progesterone receptor positive status: Secondary | ICD-10-CM | POA: Diagnosis not present

## 2023-09-14 DIAGNOSIS — G4733 Obstructive sleep apnea (adult) (pediatric): Secondary | ICD-10-CM | POA: Diagnosis not present

## 2023-09-14 DIAGNOSIS — C50911 Malignant neoplasm of unspecified site of right female breast: Secondary | ICD-10-CM

## 2023-09-14 DIAGNOSIS — F419 Anxiety disorder, unspecified: Secondary | ICD-10-CM | POA: Diagnosis not present

## 2023-09-14 DIAGNOSIS — Z87891 Personal history of nicotine dependence: Secondary | ICD-10-CM | POA: Diagnosis not present

## 2023-09-14 DIAGNOSIS — Z170 Estrogen receptor positive status [ER+]: Secondary | ICD-10-CM | POA: Insufficient documentation

## 2023-09-14 HISTORY — PX: BREAST LUMPECTOMY WITH RADIOACTIVE SEED LOCALIZATION: SHX6424

## 2023-09-14 SURGERY — BREAST LUMPECTOMY WITH RADIOACTIVE SEED LOCALIZATION
Anesthesia: General | Laterality: Right

## 2023-09-14 MED ORDER — SUCCINYLCHOLINE CHLORIDE 200 MG/10ML IV SOSY
PREFILLED_SYRINGE | INTRAVENOUS | Status: DC | PRN
Start: 2023-09-14 — End: 2023-09-14
  Administered 2023-09-14: 100 mg via INTRAVENOUS

## 2023-09-14 MED ORDER — CEFAZOLIN SODIUM-DEXTROSE 2-4 GM/100ML-% IV SOLN
2.0000 g | INTRAVENOUS | Status: AC
  Administered 2023-09-14: 2 g via INTRAVENOUS
  Filled 2023-09-14: qty 100

## 2023-09-14 MED ORDER — BUPIVACAINE-EPINEPHRINE 0.25% -1:200000 IJ SOLN
INTRAMUSCULAR | Status: DC | PRN
  Administered 2023-09-14: 30 mL

## 2023-09-14 MED ORDER — AMISULPRIDE (ANTIEMETIC) 5 MG/2ML IV SOLN: 10.0000 mg | Freq: Once | INTRAVENOUS | Status: DC | PRN

## 2023-09-14 MED ORDER — OXYCODONE HCL 5 MG PO TABS
5.0000 mg | ORAL_TABLET | Freq: Once | ORAL | Status: AC | PRN
  Administered 2023-09-14: 5 mg via ORAL

## 2023-09-14 MED ORDER — LIDOCAINE 2% (20 MG/ML) 5 ML SYRINGE
INTRAMUSCULAR | Status: AC
  Filled 2023-09-14: qty 5

## 2023-09-14 MED ORDER — ONDANSETRON HCL 4 MG/2ML IJ SOLN
INTRAMUSCULAR | Status: DC | PRN
  Administered 2023-09-14: 4 mg via INTRAVENOUS

## 2023-09-14 MED ORDER — OXYCODONE HCL 5 MG/5ML PO SOLN: 5.0000 mg | Freq: Once | ORAL | Status: AC | PRN

## 2023-09-14 MED ORDER — CHLORHEXIDINE GLUCONATE CLOTH 2 % EX PADS: 6.0000 | MEDICATED_PAD | Freq: Once | CUTANEOUS | Status: DC

## 2023-09-14 MED ORDER — CHLORHEXIDINE GLUCONATE 0.12 % MT SOLN
15.0000 mL | Freq: Once | OROMUCOSAL | Status: AC
  Administered 2023-09-14: 15 mL via OROMUCOSAL
  Filled 2023-09-14: qty 15

## 2023-09-14 MED ORDER — FENTANYL CITRATE (PF) 250 MCG/5ML IJ SOLN
INTRAMUSCULAR | Status: DC | PRN
  Administered 2023-09-14: 25 ug via INTRAVENOUS
  Administered 2023-09-14: 50 ug via INTRAVENOUS

## 2023-09-14 MED ORDER — LACTATED RINGERS IV SOLN: INTRAVENOUS | Status: DC

## 2023-09-14 MED ORDER — DEXAMETHASONE SODIUM PHOSPHATE 10 MG/ML IJ SOLN
INTRAMUSCULAR | Status: DC | PRN
  Administered 2023-09-14: 10 mg via INTRAVENOUS

## 2023-09-14 MED ORDER — OXYCODONE HCL 5 MG PO TABS: 5.0000 mg | ORAL_TABLET | Freq: Four times a day (QID) | ORAL | 0 refills | Status: DC | PRN

## 2023-09-14 MED ORDER — PHENYLEPHRINE HCL-NACL 20-0.9 MG/250ML-% IV SOLN
INTRAVENOUS | Status: DC | PRN
  Administered 2023-09-14: 20 ug/min via INTRAVENOUS

## 2023-09-14 MED ORDER — FENTANYL CITRATE (PF) 100 MCG/2ML IJ SOLN
25.0000 ug | INTRAMUSCULAR | Status: DC | PRN
  Administered 2023-09-14: 50 ug via INTRAVENOUS

## 2023-09-14 MED ORDER — PROPOFOL 10 MG/ML IV BOLUS
INTRAVENOUS | Status: AC
  Filled 2023-09-14: qty 20

## 2023-09-14 MED ORDER — FENTANYL CITRATE (PF) 250 MCG/5ML IJ SOLN
INTRAMUSCULAR | Status: AC
  Filled 2023-09-14: qty 5

## 2023-09-14 MED ORDER — ONDANSETRON HCL 4 MG/2ML IJ SOLN
INTRAMUSCULAR | Status: AC
  Filled 2023-09-14: qty 2

## 2023-09-14 MED ORDER — ORAL CARE MOUTH RINSE: 15.0000 mL | Freq: Once | OROMUCOSAL | Status: AC

## 2023-09-14 MED ORDER — LIDOCAINE 2% (20 MG/ML) 5 ML SYRINGE
INTRAMUSCULAR | Status: DC | PRN
  Administered 2023-09-14: 80 mg via INTRAVENOUS

## 2023-09-14 MED ORDER — ACETAMINOPHEN 500 MG PO TABS
1000.0000 mg | ORAL_TABLET | ORAL | Status: AC
  Administered 2023-09-14: 1000 mg via ORAL
  Filled 2023-09-14: qty 2

## 2023-09-14 MED ORDER — PROPOFOL 10 MG/ML IV BOLUS
INTRAVENOUS | Status: DC | PRN
  Administered 2023-09-14 (×2): 30 mg via INTRAVENOUS
  Administered 2023-09-14: 125 ug/kg/min via INTRAVENOUS
  Administered 2023-09-14: 150 mg via INTRAVENOUS
  Administered 2023-09-14: 50 mg via INTRAVENOUS

## 2023-09-14 MED ORDER — FENTANYL CITRATE (PF) 100 MCG/2ML IJ SOLN
INTRAMUSCULAR | Status: AC
  Filled 2023-09-14: qty 2

## 2023-09-14 MED ORDER — BUPIVACAINE-EPINEPHRINE (PF) 0.25% -1:200000 IJ SOLN
INTRAMUSCULAR | Status: AC
  Filled 2023-09-14: qty 30

## 2023-09-14 MED ORDER — OXYCODONE HCL 5 MG PO TABS
ORAL_TABLET | ORAL | Status: AC
  Filled 2023-09-14: qty 1

## 2023-09-14 MED ORDER — DEXAMETHASONE SODIUM PHOSPHATE 10 MG/ML IJ SOLN
INTRAMUSCULAR | Status: AC
  Filled 2023-09-14: qty 1

## 2023-09-14 SURGICAL SUPPLY — 40 items
ADH SKN CLS APL DERMABOND .7 (GAUZE/BANDAGES/DRESSINGS) ×1
APL PRP STRL LF DISP 70% ISPRP (MISCELLANEOUS) ×1
APPLIER CLIP 9.375 MED OPEN (MISCELLANEOUS)
APR CLP MED 9.3 20 MLT OPN (MISCELLANEOUS)
BAG COUNTER SPONGE SURGICOUNT (BAG) ×1 IMPLANT
BAG SPNG CNTER NS LX DISP (BAG) ×1
BINDER BREAST LRG (GAUZE/BANDAGES/DRESSINGS) IMPLANT
BINDER BREAST XLRG (GAUZE/BANDAGES/DRESSINGS) IMPLANT
CANISTER SUCT 3000ML PPV (MISCELLANEOUS) IMPLANT
CHLORAPREP W/TINT 26 (MISCELLANEOUS) ×1 IMPLANT
CLIP APPLIE 9.375 MED OPEN (MISCELLANEOUS) IMPLANT
COVER PROBE W GEL 5X96 (DRAPES) ×1 IMPLANT
COVER SURGICAL LIGHT HANDLE (MISCELLANEOUS) ×1 IMPLANT
DERMABOND ADVANCED .7 DNX12 (GAUZE/BANDAGES/DRESSINGS) ×1 IMPLANT
DEVICE DUBIN SPECIMEN MAMMOGRA (MISCELLANEOUS) ×1 IMPLANT
DRAPE CHEST BREAST 15X10 FENES (DRAPES) ×1 IMPLANT
ELECT CAUTERY BLADE 6.4 (BLADE) ×1 IMPLANT
ELECT REM PT RETURN 9FT ADLT (ELECTROSURGICAL) ×1
ELECTRODE REM PT RTRN 9FT ADLT (ELECTROSURGICAL) ×1 IMPLANT
GAUZE PAD ABD 8X10 STRL (GAUZE/BANDAGES/DRESSINGS) ×1 IMPLANT
GLOVE BIO SURGEON STRL SZ8 (GLOVE) ×1 IMPLANT
GLOVE BIOGEL PI IND STRL 8 (GLOVE) ×1 IMPLANT
GOWN STRL REUS W/ TWL LRG LVL3 (GOWN DISPOSABLE) ×1 IMPLANT
GOWN STRL REUS W/ TWL XL LVL3 (GOWN DISPOSABLE) ×1 IMPLANT
GOWN STRL REUS W/TWL LRG LVL3 (GOWN DISPOSABLE) ×1
GOWN STRL REUS W/TWL XL LVL3 (GOWN DISPOSABLE) ×1
KIT BASIN OR (CUSTOM PROCEDURE TRAY) ×1 IMPLANT
KIT MARKER MARGIN INK (KITS) ×1 IMPLANT
LIGHT WAVEGUIDE WIDE FLAT (MISCELLANEOUS) IMPLANT
NDL HYPO 25GX1X1/2 BEV (NEEDLE) ×1 IMPLANT
NEEDLE HYPO 25GX1X1/2 BEV (NEEDLE) ×1
NS IRRIG 1000ML POUR BTL (IV SOLUTION) IMPLANT
PACK GENERAL/GYN (CUSTOM PROCEDURE TRAY) ×1 IMPLANT
SUT MNCRL AB 4-0 PS2 18 (SUTURE) ×1 IMPLANT
SUT SILK 2 0 SH (SUTURE) IMPLANT
SUT VIC AB 2-0 SH 27 (SUTURE)
SUT VIC AB 2-0 SH 27XBRD (SUTURE) IMPLANT
SUT VIC AB 3-0 SH 8-18 (SUTURE) ×1 IMPLANT
SYR CONTROL 10ML LL (SYRINGE) ×1 IMPLANT
TOWEL GREEN STERILE FF (TOWEL DISPOSABLE) IMPLANT

## 2023-09-14 NOTE — Anesthesia Postprocedure Evaluation (Signed)
Anesthesia Post Note  Patient: Marua Qin  Procedure(s) Performed: RIGHT BREAST LUMPECTOMY WITH RADIOACTIVE SEED LOCALIZATION (Right)     Patient location during evaluation: PACU Anesthesia Type: General Level of consciousness: awake Pain management: pain level controlled Vital Signs Assessment: post-procedure vital signs reviewed and stable Respiratory status: spontaneous breathing, nonlabored ventilation and respiratory function stable Cardiovascular status: blood pressure returned to baseline and stable Postop Assessment: no apparent nausea or vomiting Anesthetic complications: yes   Encounter Notable Events  Notable Event Outcome Phase Comment  Difficult to intubate - unexpected  Intraprocedure Filed from anesthesia note documentation.    Last Vitals:  Vitals:   09/14/23 0943 09/14/23 0958  BP: 123/76 130/71  Pulse: 68 65  Resp: 11 16  Temp:  36.4 C  SpO2: 92% 97%    Last Pain:  Vitals:   09/14/23 0958  TempSrc:   PainSc: 3                  Linton Rump

## 2023-09-14 NOTE — Interval H&P Note (Signed)
History and Physical Interval Note:  09/14/2023 7:56 AM  Page Spiro  has presented today for surgery, with the diagnosis of RIGHT BREAST CANCER.  The various methods of treatment have been discussed with the patient and family. After consideration of risks, benefits and other options for treatment, the patient has consented to  Procedure(s): RIGHT BREAST LUMPECTOMY WITH RADIOACTIVE SEED LOCALIZATION (Right) as a surgical intervention.  The patient's history has been reviewed, patient examined, no change in status, stable for surgery.  I have reviewed the patient's chart and labs.  Questions were answered to the patient's satisfaction.     Roquel Burgin A Daimen Shovlin

## 2023-09-14 NOTE — Telephone Encounter (Signed)
Pharmacy Patient Advocate Encounter  Received notification from [facility] Medicaid that Prior Authorization for Mirabegron ER Tablet ER 24HR 25MG  has been DENIED.  Full denial letter will be uploaded to the media tab. See denial reason below.  This drug is not on our formulary. Our records did not show that you have tried at least two other drugs on the formulary used to treat condition. You have already tried Oxybutynin. Based on the condition provided, the other drug(s) on the formulary is/are: Tolterodine Tartrate Table (1mg , and 2mg ) (Step therapy required), Tolterodine Tartrate ER Capsule (2mg , and 4mg ) (Step therapy required), Trospium Chloride Tablet 20mg , Myrbetriq ER Tablet (25mg , and 50mg ) Gemtesa Oral Tablet 75mg , Solifenacin succinate oral tablet (5mg , 10mg ), Festoerodine fumarate er oral tablet extended release 24 hour (4mg , 8mg ). Myrbetriq (brand product) has the same active ingredient as the generic drug.   PA #/Case ID/Reference #: [id number]   Please be advised we currently do not have a Pharmacist to review denials, therefore you will need to process appeals accordingly as needed. Thanks for your support at this time. Contact for appeals are as follows: Phone: [phone], Fax: [phone]

## 2023-09-14 NOTE — H&P (Signed)
History of Present Illness: Dana Harding is a 72 y.o. female who is seen today as an office consultation for evaluation of Breast Cancer  72 year old female seen today in the multidisciplinary clinic for new diagnosed right breast cancer. The patient was found to have a 4 mm mass right breast upper outer quadrant core biopsy to be consistent with invasive ductal carcinoma. Denies any history of breast pain breast mass or nipple.   Review of Systems: A complete review of systems was obtained from the patient. I have reviewed this information and discussed as appropriate with the patient. See HPI as well for other ROS.    Medical History: Past Medical History:  Diagnosis Date  Anxiety  Arthritis  GERD (gastroesophageal reflux disease)  History of cancer   Patient Active Problem List  Diagnosis  Pain of foot  Anxiety  Back pain  Dry scalp  GERD (gastroesophageal reflux disease)  Dysphagia, unspecified type  HLD (hyperlipidemia)  IBS (irritable bowel syndrome)  Intermittent vertigo  LLQ pain  Muscle pain  Nummular eczema  Seborrheic dermatitis  Hx of laparoscopic gastric banding  Urinary frequency  Malignant neoplasm of central portion of right breast in female, estrogen receptor positive (CMS/HHS-HCC)   Past Surgical History:  Procedure Laterality Date  Laparoscopic Gastric Band Placement 2008  Removal of Gastric Band 2021  ABDOMINOPLASTY  CATARACT EXTRACTION Bilateral  LAPAROSCOPIC CHOLECYSTECTOMY  REDUCTION MAMMAPLASTY  REPAIR INGUINAL HERNIA Bilateral  Bilateral- done twice  REPAIR ROTATOR CUFF TEAR CHRONIC OPEN Left  TONSILLECTOMY  UMBILICAL HERNIA REPAIR    Allergies  Allergen Reactions  Latex Itching and Rash  Aspirin Abdominal Pain and Other (See Comments)  Omeprazole Nausea  nausea   Current Outpatient Medications on File Prior to Visit  Medication Sig Dispense Refill  clidinium-chlordiazePOXIDE (LIBRAX) 5-2.5 mg capsule Take 1 capsule by mouth 3  (three) times daily as needed (Esophagel Spasms)  oxyBUTYnin (DITROPAN-XL) 10 MG XL tablet Take 1 tablet by mouth once daily  azelastine (ASTELIN) 137 mcg nasal spray Place 1 spray into both nostrils 2 (two) times daily 1-2 sprays  cholecalciferol, vitamin D3, (VITAMIN D3) 125 mcg (5,000 unit) tablet Take 5,000 Units by mouth once daily  clidinium-chlordiazepoxide (LIBRAX) 5-2.5 mg capsule TAKE 1 CAPSULE BY MOUTH 3 TIMES A DAY AS NEEDED  dicyclomine (BENTYL) 10 mg capsule TAKE 1 CAPSULE (10 MG TOTAL) BY MOUTH 4 (FOUR) TIMES DAILY AS NEEDED FOR SPASMS.  HYDROcodone-acetaminophen (NORCO) 5-325 mg tablet TAKE 1 TABLET BY MOUTH 3 TIMES A DAY AS NEEDED FOR MODERATE PAIN  hydrocortisone 2.5 % cream hydrocortisone 2.5 % topical cream  oxybutynin (DITROPAN-XL) 10 MG XL tablet Take by mouth   No current facility-administered medications on file prior to visit.   Family History  Problem Relation Age of Onset  Breast cancer Mother  Stroke Father  Hyperlipidemia (Elevated cholesterol) Father  Coronary Artery Disease (Blocked arteries around heart) Father  High blood pressure (Hypertension) Sister  Deep vein thrombosis (DVT or abnormal blood clot formation) Sister  Colon cancer Sister  Cancer Sister  Hyperlipidemia (Elevated cholesterol) Brother    Social History   Tobacco Use  Smoking Status Former  Types: Cigarettes  Smokeless Tobacco Never  Tobacco Comments  Quit smoking cigarettes on 12-06-1978    Social History   Socioeconomic History  Marital status: Single  Tobacco Use  Smoking status: Former  Types: Cigarettes  Smokeless tobacco: Never  Tobacco comments:  Quit smoking cigarettes on 12-06-1978  Vaping Use  Vaping status: Never Used  Substance and  Sexual Activity  Alcohol use: Yes  Drug use: Never   Social Determinants of Health   Financial Resource Strain: Low Risk (08/17/2023)  Received from [facility]  Overall Financial Resource Strain (CARDIA)  Difficulty of Paying  Living Expenses: Not hard at all  Food Insecurity: No Food Insecurity (08/17/2023)  Received from [facility]  Hunger Vital Sign  Worried About Running Out of Food in the Last Year: Never true  Ran Out of Food in the Last Year: Never true  Transportation Needs: No Transportation Needs (08/17/2023)  Received from [facility] - Transportation  Lack of Transportation (Medical): No  Lack of Transportation (Non-Medical): No  Physical Activity: Insufficiently Active (08/17/2023)  [facility]  Exercise Vital Sign  Days of Exercise per Week: 3 days  Minutes of Exercise per Session: 30 min  Stress: No Stress Concern Present (08/17/2023)  Received from [facility] - Occupational Stress Questionnaire  Feeling of Stress : Not at all  Social Connections: Moderately Isolated (08/17/2023)  Received from [facility]  Social Connection and Isolation Panel [NHANES]  Frequency of Communication with Friends and Family: More than three times a week  Frequency of Social Gatherings with Friends and Family: More than three times a week  Attends Religious Services: More than 4 times per year  Active Member of [employer] or Organizations: No  Attends Banker Meetings: Never  Marital Status: Never married   Objective:  There were no vitals filed for this visit.  There is no height or weight on file to calculate BMI.  Physical Exam Exam conducted with a chaperone present.  HENT:  Head: Normocephalic.  Cardiovascular:  Rate and Rhythm: Normal rate.  Pulmonary:  Effort: Pulmonary effort is normal.  Chest:  Breasts: Right: Normal.  Left: Normal.   Comments: Bx site  Musculoskeletal:  General: Normal range of motion.  Cervical back: Normal range of motion.  Lymphadenopathy:  Upper Body:  Right upper body: No supraclavicular or axillary adenopathy.  Left upper body: No supraclavicular or axillary adenopathy.  Neurological:   General: No focal deficit present.  Mental Status: She is alert.  Psychiatric:  Mood and Affect: Mood normal.     Labs, Imaging and Diagnostic Testing:  1. Breast, right, needle core biopsy, mass retroareolar : - INVASIVE DUCTAL CARCINOMA, SEE NOTE - TUBULE FORMATION: SCORE 2 - NUCLEAR PLEOMORPHISM: SCORE 2 - MITOTIC COUNT: SCORE 1 - TOTAL SCORE: 5 - OVERALL GRADE: 1 - LYMPHOVASCULAR INVASION: NOT IDENTIFIED - CANCER LENGTH: 0.5 CM - CALCIFICATIONS: NOT IDENTIFIED  NOTE: DR. Kenyon Ana REVIEWED THE CASE AND CONCURS WITH THE INTERPRETATION. A BREAST PROGNOSTIC PROFILE (ER, PR, KI-67 AND HER2) IS PENDING AND WILL BE REPORTED IN AN ADDENDUM. SOLIS WAS NOTIFIED ON 08/19/2023.  DATE SIGNED OUT: 08/19/2023 ELECTRONIC SIGNATURE Brenton Callas M.D., Nupur, Pathologist, Electronic Signature  MICROSCOPIC DESCRIPTION  CASE COMMENTS STAINS USED IN DIAGNOSIS: H&E-2 H&E-3 H&E-4 H&E *RECUT 1 SLIDE Stains used in diagnosis 1 Her2 by IHC, 1 ER-ACIS, 1 KI-67-ACIS, 1 PR-ACIS IHC scores are reported using ASCO/CAP scoring criteria. An IHC Score of 0 or 1+ is NEGATIVE for HER2, 3+ is POSITIVE for HER2, and 2+ is EQUIVOCAL. Equivocal results are reflexed to either FISH or IHC testing. Specimens are fixed in 10% Neutral Buffered Formalin for at least 6 hours and up to 72 hours. These tests have not be validated on decalcified tissue. Results should be interpreted with caution given the possibility of false negative  results on decalcified specimens. Antibody Clone for HER2 is 4B5 (PATHWAY). Some of these immunohistochemical stains may have been developed and the performance characteristics determined by [facility]. Some may not have been cleared or approved by the U.S. Food and Drug Administration. The FDA has determined that such clearance or approval is not necessary. This test is used for clinical purposes. It should not be regarded as investigational or for research. This  laboratory is certified under the Clinical Laboratory Improvement Amendments of 1988 (CLIA-88) as qualified to perform high complexity clinical laboratory testing. Estrogen receptor (6F11), immunohistochemical stains are performed on formalin fixed, paraffin embedded tissue using a 3,3"-diaminobenzidine (DAB) chromogen and Leica Bond Autostainer System. The staining intensity of the nucleus is scored manually and is reported as the percentage of tumor cell nuclei demonstrating specific nuclear staining.Specimens are fixed in 10% Neutral Buffered Formalin for at least 6 hours and up to 72 hours. These tests have not be validated on decalcified tissue. Results should be interpreted with caution given the possibility of false negative results on decalcified specimens. Ki-67 (MM1), immunohistochemical stains are performed on formalin fixed, paraffin embedded tissue using a 3,3"-diaminobenzidine (DAB) chromogen and Leica Bond Autostainer System. The staining intensity of the nucleus is scored manually and is reported as the percentage of tumor cell nuclei demonstrating specific nuclear staining.Specimens are fixed in 10% Neutral Buffered Formalin for at least 6 hours and up to 72 hours. These tests have not be validated on decalcified tissue. Results should be interpreted with caution given the possibility of false negative results on decalcified specimens. PR progesterone receptor (16), immunohistochemical stains are performed on formalin fixed, paraffin embedded tissue using a 3,3"-diaminobenzidine (DAB) chromogen and Leica Bond Autostainer System. The staining intensity of the nucleus is scored manually and is reported as the percentage of tumor cell nuclei demonstrating specific nuclear staining.Specimens are fixed in 10% Neutral Buffered Formalin for at least 6 hours and up to 72 hours. These tests have not be validated on decalcified tissue. Results should be interpreted with caution given  the possibility of false negative results on decalcified specimens.  ADDENDUM 1A) Breast, right, needle core biopsy, mass retroareolar PROGNOSTIC INDICATORS  Results: IMMUNOHISTOCHEMICAL AND MORPHOMETRIC ANALYSIS PERFORMED MANUALLY The tumor cells are NEGATIVE for Her2 (1+). Estrogen Receptor: 100%, POSITIVE, STRONG STAINING INTENSITY Progesterone Receptor: 50%, POSITIVE, STRONG STAINING INTENSITY Proliferation Marker Ki67: 5% REFERENCE RANGE ESTROGEN RECEPTOR NEGATIVE 0% POSITIVE =>1% REFERENCE RANGE PROGESTERONE RECEPTOR NEGATIVE 0% POSITIVE =>1% All controls stained appropriately Arville Care, Zhaoli, Sports administrator, Electronic Signature ( Signed [phone])  CLINICAL HISTORY  SPECIMEN(S) OBTAINED 1. Breast, right, needle core biopsy, Mass Retroareolar  SPECIMEN COMMENTS: 1. In formalin: 1:20, CIT: 1:15, 72 y/o AAF with right breast mass, H/O bilateral breast noductish SPECIMEN CLINICAL INFORMATION:   Mammogram reveals a 4 mm distortion right breast upper outer quadrant ultrasound of axilla is normal Assessment and Plan:   Diagnoses and all orders for this visit:  Malignant neoplasm of central portion of right breast in female, estrogen receptor positive (CMS/HHS-HCC)   Reviewed the pathophysiology of breast cancer. Reviewed breast conserving surgery versus mastectomy with reconstruction and surgical options. Reviewed local regional recurrence rates, adjuvant therapy, overall survival and quality of life. She is opted for a right breast seed localized lumpectomy.The procedure has been discussed with the patient. Alternatives to surgery have been discussed with the patient. Risks of surgery include bleeding, Infection, Seroma formation, death, and the need for further surgery. The patient understands and wishes to proceed.  Hayden Rasmussen, MD

## 2023-09-14 NOTE — Anesthesia Postprocedure Evaluation (Signed)
Anesthesia Post Note  Patient: Dana Harding  Procedure(s) Performed: RIGHT BREAST LUMPECTOMY WITH RADIOACTIVE SEED LOCALIZATION (Right)     Patient location during evaluation: PACU Anesthesia Type: General Level of consciousness: awake Pain management: pain level controlled Vital Signs Assessment: post-procedure vital signs reviewed and stable Respiratory status: spontaneous breathing, nonlabored ventilation and respiratory function stable Cardiovascular status: blood pressure returned to baseline and stable Postop Assessment: no apparent nausea or vomiting Anesthetic complications: yes   Encounter Notable Events  Notable Event Outcome Phase Comment  Difficult to intubate - unexpected  Intraprocedure Filed from anesthesia note documentation.    Last Vitals:  Vitals:   09/14/23 0943 09/14/23 0958  BP: 123/76 130/71  Pulse: 68 65  Resp: 11 16  Temp:  36.4 C  SpO2: 92% 97%    Last Pain:  Vitals:   09/14/23 0958  TempSrc:   PainSc: 3                  Linton Rump

## 2023-09-14 NOTE — Progress Notes (Signed)
Patient recovered and in phase II waiting for ride. Attempted to call and message daughter. Left voicemail.

## 2023-09-14 NOTE — Transfer of Care (Signed)
Immediate Anesthesia Transfer of Care Note  Patient: Dana Harding  Procedure(s) Performed: RIGHT BREAST LUMPECTOMY WITH RADIOACTIVE SEED LOCALIZATION (Right)  Patient Location: PACU  Anesthesia Type:General  Level of Consciousness: awake, alert , and oriented  Airway & Oxygen Therapy: Patient Spontanous Breathing and Patient connected to face mask oxygen  Post-op Assessment: Report given to RN and Post -op Vital signs reviewed and stable  Post vital signs: Reviewed and stable  Last Vitals:  Vitals Value Taken Time  BP 124/72 09/14/23 0912  Temp 97.4   Pulse 96 09/14/23 0915  Resp 14   SpO2 98 % 09/14/23 0915  Vitals shown include unfiled device data.  Last Pain:  Vitals:   09/14/23 0714  TempSrc: Oral  PainSc: 0-No pain         Complications:  Encounter Notable Events  Notable Event Outcome Phase Comment  Difficult to intubate - unexpected  Intraprocedure Filed from anesthesia note documentation.

## 2023-09-14 NOTE — Op Note (Signed)
Preoperative diagnosis: Right breast cancer upper outer quadrant ER positive stage I  Postoperative diagnosis: Same  Procedure: Right breast seed localized lumpectomy  Surgeon: Harriette Bouillon, MD  Anesthesia: LMA with 0.25% Marcaine with epinephrine  EBL: 10 cc  Drains: None  Specimen: Right breast tissue with seed and clip verified by intraoperative imaging  Indications for procedure: The patient is a 72 year old female with stage I right breast cancer.  She was seen in the multidisciplinary clinic and opted for breast conserving surgery.  We discussed the pros and cons of sentinel lymph node mapping reviewing the data of women over the age of 22 with favorable tumor prognostics and tumor size.  We reviewed the potential risk of lymphedema and morbidity from this and the overall benefit which would not impact her overall survival.  She opted to proceed with lumpectomy alone.The procedure has been discussed with the patient. Alternatives to surgery have been discussed with the patient.  Risks of surgery include bleeding,  Infection,  Seroma formation, death,  and the need for further surgery.   The patient understands and wishes to proceed.     Description of procedure: The patient was met in the holding area and questions were answered.  She underwent seed placement as an outpatient in the right breast.  The right breast was marked as the correct site and all questions were answered.  She was taken to the operative room placed upon upon the operating table.  After induction of general anesthesia, right breast was prepped and draped in sterile fashion and timeout performed.  Proper patient, site and procedure verified.  The neoprobe was used identify the seed right breast upper outer quadrant.  A curvilinear incision was made along the lateral border of the nipple areolar complex.  Dissection was carried down all tissue and the seed and clip were excised with grossly negative margins.  Imaging  revealed the seed and clip to be in the specimen.  Of note the tissue was oriented with ink for processing by pathology.  The lumpectomy cavity is irrigated.  Local anesthetic infiltrated.  Clips were placed to mark the cavity and local anesthetic infiltrated.  Hemostasis achieved.  The deep tissue planes were approximated with 3-0 Vicryl.  4 Monocryl used to close the skin in a subcuticular fashion.  Dermabond applied.  All counts found to be correct.  The patient was awoke extubated taken to recovery in satisfactory condition.

## 2023-09-14 NOTE — Anesthesia Procedure Notes (Signed)
Procedure Name: Intubation Date/Time: 09/14/2023 8:28 AM  Performed by: Pincus Large, CRNAPre-anesthesia Checklist: Patient identified, Emergency Drugs available, Suction available and Patient being monitored Patient Re-evaluated:Patient Re-evaluated prior to induction Oxygen Delivery Method: Circle System Utilized Preoxygenation: Pre-oxygenation with 100% oxygen Induction Type: IV induction Ventilation: Mask ventilation without difficulty Laryngoscope Size: Mac and 3 Grade View: Grade II Tube type: Oral Tube size: 7.0 mm Number of attempts: 2 (1st attempt with LMA 4 unsuccesful) Airway Equipment and Method: Stylet and Oral airway Placement Confirmation: ETT inserted through vocal cords under direct vision, positive ETCO2 and breath sounds checked- equal and bilateral Tube secured with: Tape Dental Injury: Teeth and Oropharynx as per pre-operative assessment  Difficulty Due To: Difficulty was unanticipated

## 2023-09-14 NOTE — Discharge Instructions (Signed)
[facility] Office Phone Number [phone]  BREAST BIOPSY/ PARTIAL MASTECTOMY: POST OP INSTRUCTIONS  Always review your discharge instruction sheet given to you by the facility where your surgery was performed.  IF YOU HAVE DISABILITY OR FAMILY LEAVE FORMS, YOU MUST BRING THEM TO THE OFFICE FOR PROCESSING.  DO NOT GIVE THEM TO YOUR DOCTOR.  A prescription for pain medication may be given to you upon discharge.  Take your pain medication as prescribed, if needed.  If narcotic pain medicine is not needed, then you may take acetaminophen (Tylenol) or ibuprofen (Advil) as needed. Take your usually prescribed medications unless otherwise directed If you need a refill on your pain medication, please contact your pharmacy.  They will contact our office to request authorization.  Prescriptions will not be filled after 5pm or on week-ends. You should eat very light the first 24 hours after surgery, such as soup, crackers, pudding, etc.  Resume your normal diet the day after surgery. Most patients will experience some swelling and bruising in the breast.  Ice packs and a good support bra will help.  Swelling and bruising can take several days to resolve.  It is common to experience some constipation if taking pain medication after surgery.  Increasing fluid intake and taking a stool softener will usually help or prevent this problem from occurring.  A mild laxative (Milk of Magnesia or Miralax) should be taken according to package directions if there are no bowel movements after 48 hours. Unless discharge instructions indicate otherwise, you may remove your bandages 24-48 hours after surgery, and you may shower at that time.  You may have steri-strips (small skin tapes) in place directly over the incision.  These strips should be left on the skin for 7-10 days.  If your surgeon used skin glue on the incision, you may shower in 24 hours.  The glue will flake off over the next 2-3 weeks.  Any  sutures or staples will be removed at the office during your follow-up visit. ACTIVITIES:  You may resume regular daily activities (gradually increasing) beginning the next day.  Wearing a good support bra or sports bra minimizes pain and swelling.  You may have sexual intercourse when it is comfortable. You may drive when you no longer are taking prescription pain medication, you can comfortably wear a seatbelt, and you can safely maneuver your car and apply brakes. RETURN TO WORK:  ______________________________________________________________________________________ You should see your doctor in the office for a follow-up appointment approximately two weeks after your surgery.  Your doctor's nurse will typically make your follow-up appointment when she calls you with your pathology report.  Expect your pathology report 2-3 business days after your surgery.  You may call to check if you do not hear from us after three days. OTHER INSTRUCTIONS: _______________________________________________________________________________________________ _____________________________________________________________________________________________________________________________________ _____________________________________________________________________________________________________________________________________ _____________________________________________________________________________________________________________________________________  WHEN TO CALL YOUR DOCTOR: Fever over 101.0 Nausea and/or vomiting. Extreme swelling or bruising. Continued bleeding from incision. Increased pain, redness, or drainage from the incision.  The clinic staff is available to answer your questions during regular business hours.  Please don't hesitate to call and ask to speak to one of the nurses for clinical concerns.  If you have a medical emergency, go to the nearest emergency room or call 911.  A surgeon from [facility] is always on call at the hospital.  For further questions, please visit [email or web address]   

## 2023-09-15 ENCOUNTER — Encounter (HOSPITAL_COMMUNITY): Payer: Self-pay | Admitting: Surgery

## 2023-09-15 ENCOUNTER — Encounter: Payer: Self-pay | Admitting: Surgery

## 2023-09-15 LAB — SURGICAL PATHOLOGY

## 2023-09-16 ENCOUNTER — Encounter: Payer: Self-pay | Admitting: Family Medicine

## 2023-09-16 ENCOUNTER — Ambulatory Visit (INDEPENDENT_AMBULATORY_CARE_PROVIDER_SITE_OTHER): Payer: Medicare Other | Admitting: Family Medicine

## 2023-09-16 VITALS — BP 122/72 | HR 86 | Temp 98.2°F | Ht 67.0 in | Wt 188.0 lb

## 2023-09-16 DIAGNOSIS — C50111 Malignant neoplasm of central portion of right female breast: Secondary | ICD-10-CM | POA: Diagnosis not present

## 2023-09-16 DIAGNOSIS — E279 Disorder of adrenal gland, unspecified: Secondary | ICD-10-CM

## 2023-09-16 DIAGNOSIS — Z0000 Encounter for general adult medical examination without abnormal findings: Secondary | ICD-10-CM

## 2023-09-16 DIAGNOSIS — Z170 Estrogen receptor positive status [ER+]: Secondary | ICD-10-CM | POA: Diagnosis not present

## 2023-09-16 DIAGNOSIS — R350 Frequency of micturition: Secondary | ICD-10-CM | POA: Diagnosis not present

## 2023-09-16 DIAGNOSIS — Z7189 Other specified counseling: Secondary | ICD-10-CM

## 2023-09-16 NOTE — Progress Notes (Signed)
Breast cancer. She didn't have to fill oxycodone rx.  Healing in the meantime.  Had old rx for hydrocodone and used it occ, ie 1/2 tab prn.  No FCNAVD.  She has f/u pending.  Path report d/w pt.    LUTS.  She had lack of effect with oxybutynin.  D/w pt that I will check on appeal for mybetriq.    She is putting up with hip pain.  She had eval at outside clinic.  She can update me as needed.  Flu when possible Shingles 2020 PNA-20 done at office visit 07/2021 Tetanus 2020 COVID-vaccine up-to-date. Colonoscopy 2024 Breast cancer screening 2024 Bone density test done in 2019, okay to defer to 2024. Advance directive discussed with patient.  Daughter (Dana Harding) designated if patient were incapacitated.  Discussed history of benign left adrenal adenoma. Given 1 year stability and imaging characteristics on imaging, no specific follow-up is recommended.  Meds, vitals, and allergies reviewed.   ROS: Per HPI unless specifically indicated in ROS section   GEN: nad, alert and oriented HEENT: ncat NECK: supple w/o LA CV: rrr.  PULM: ctab, no inc wob ABD: soft, +bs EXT: no edema SKIN: Well-perfused.  31 minutes were devoted to patient care in this encounter (this includes time spent reviewing the patient's file/history, interviewing and examining the patient, counseling/reviewing plan with patient).

## 2023-09-16 NOTE — Patient Instructions (Addendum)
Ask the surgery clinic about the flu shot.  Take care.  Glad to see you. Update me as needed.

## 2023-09-18 MED ORDER — MIRABEGRON ER 25 MG PO TB24: 25.0000 mg | ORAL_TABLET | Freq: Every day | ORAL | 5 refills | Status: DC

## 2023-09-18 NOTE — Assessment & Plan Note (Signed)
Advance directive discussed with patient.  Daughter (Dr. Providence Lanius) designated if patient were incapacitated.

## 2023-09-18 NOTE — Telephone Encounter (Signed)
I sent the original prescription for Myrbetriq.  It was denied.  Myrbetriq is listed as one of the medications to try.  I do not know why this was denied.  I resent the prescription.  Please see what you can find out on this and let me know.  Thanks.

## 2023-09-18 NOTE — Assessment & Plan Note (Signed)
Flu when possible Shingles 2020 PNA-20 done at office visit 07/2021 Tetanus 2020 COVID-vaccine up-to-date. Colonoscopy 2024 Breast cancer screening 2024 Bone density test done in 2019, okay to defer to 2024. Advance directive discussed with patient.  Daughter (Dr. Providence Lanius) designated if patient were incapacitated.

## 2023-09-18 NOTE — Assessment & Plan Note (Signed)
She is going to follow-up with the surgery clinic.  Her pain has improved in the meantime.  She did not have to fill a prescription for oxycodone.  Path report discussed with patient.  I will await update from hematology and surgery.

## 2023-09-18 NOTE — Assessment & Plan Note (Signed)
Discussed no follow-up needed. ?

## 2023-09-18 NOTE — Addendum Note (Signed)
Addended by: Joaquim Nam on: 09/18/2023 05:27 PM   Modules accepted: Orders

## 2023-09-18 NOTE — Assessment & Plan Note (Signed)
She had lack of effect with oxybutynin.  D/w pt that I will check on appeal for mybetriq.

## 2023-09-19 ENCOUNTER — Encounter: Payer: Self-pay | Admitting: Surgery

## 2023-09-20 ENCOUNTER — Encounter: Payer: Self-pay | Admitting: *Deleted

## 2023-09-20 NOTE — Telephone Encounter (Signed)
It appears that the patient will have to try other medications prior to the insurance approving the Myrbtrig. "the other drug(s) on the formulary is/are: Tolterodine Tartrate Table (1mg , and 2mg ) (Step therapy required), Tolterodine Tartrate ER Capsule (2mg , and 4mg ) (Step therapy required), Trospium Chloride Tablet 20mg , Myrbetriq ER Tablet (25mg , and 50mg ) Gemtesa Oral Tablet 75mg , Solifenacin succinate oral tablet (5mg , 10mg ), Festoerodine fumarate er oral tablet extended release 24 hour (4mg , 8mg ).

## 2023-09-21 ENCOUNTER — Telehealth: Payer: Self-pay | Admitting: *Deleted

## 2023-09-21 ENCOUNTER — Encounter: Payer: Self-pay | Admitting: *Deleted

## 2023-09-21 MED ORDER — TOLTERODINE TARTRATE ER 2 MG PO CP24: 2.0000 mg | ORAL_CAPSULE | Freq: Every day | ORAL | 5 refills | Status: DC

## 2023-09-21 NOTE — Addendum Note (Signed)
Addended by: Joaquim Nam on: 09/21/2023 08:56 PM   Modules accepted: Orders

## 2023-09-21 NOTE — Telephone Encounter (Signed)
Please update patient.  Myrbetriq is not going to get covered w/o trying Tolterodine.  I sent that rx.  If it doesn't work, then please let me know. Thanks.

## 2023-09-21 NOTE — Telephone Encounter (Signed)
Ordered oncotype per Dr. Al Pimple. Requisition sent to pathology and exact sciences

## 2023-09-22 ENCOUNTER — Telehealth: Payer: Self-pay | Admitting: Hematology and Oncology

## 2023-09-22 NOTE — Telephone Encounter (Signed)
Spoke with patient confirming upcoming appointments  

## 2023-09-22 NOTE — Telephone Encounter (Signed)
Left patient a vm regarding upcoming appointments

## 2023-09-22 NOTE — Telephone Encounter (Signed)
Called patient reviewed all information and repeated back to me. Will call if any questions.  ? ?

## 2023-09-30 ENCOUNTER — Telehealth: Payer: Self-pay

## 2023-09-30 ENCOUNTER — Other Ambulatory Visit (HOSPITAL_COMMUNITY): Payer: Self-pay

## 2023-09-30 NOTE — Telephone Encounter (Signed)
Pharmacy Patient Advocate Encounter   Received notification from CoverMyMeds that prior authorization for Tolterodine Tartrate ER 2MG  er capsules is required/requested.   Insurance verification completed.   The patient is insured through [facility] [place] .   Per test claim: PA required; PA submitted to [facility] [place] Medicaid via CoverMyMeds Key/confirmation #/EOC [id number] Status is pending

## 2023-10-03 ENCOUNTER — Other Ambulatory Visit (HOSPITAL_COMMUNITY): Payer: Self-pay

## 2023-10-03 NOTE — Telephone Encounter (Signed)
Pharmacy Patient Advocate Encounter  Received notification from [facility] Medicaid that Prior Authorization for Tolterodine Tartrate ER 2MG  er capsules has been APPROVED from 09/16/2023 to further notice. Ran test claim, Copay is $62.98 for a 30 day supply. This test claim was processed through [facility]- copay amounts may vary at other pharmacies due to pharmacy/plan contracts, or as the patient moves through the different stages of their insurance plan.   PA #/Case ID/Reference #: [id number]  Pharmacy Contacted

## 2023-10-05 DIAGNOSIS — C50111 Malignant neoplasm of central portion of right female breast: Secondary | ICD-10-CM | POA: Diagnosis not present

## 2023-10-05 DIAGNOSIS — Z170 Estrogen receptor positive status [ER+]: Secondary | ICD-10-CM | POA: Diagnosis not present

## 2023-10-10 ENCOUNTER — Telehealth: Payer: Self-pay | Admitting: *Deleted

## 2023-10-10 ENCOUNTER — Encounter: Payer: Self-pay | Admitting: *Deleted

## 2023-10-10 NOTE — Telephone Encounter (Signed)
Received oncotype score of 23. Physician team notified.

## 2023-10-11 NOTE — Progress Notes (Signed)
Radiation Oncology         [phone] ________________________________  Name: Dana Harding MRN: [id number]  Date: 10/12/2023  DOB: May 06, 1951  Re-Evaluation Note  CC: Joaquim Nam, MD  Rachel Moulds, MD  No diagnosis found.  Diagnosis: Stage IA (cT1a, cN0, cM0) Central Right Breast, Invasive Ductal Carcinoma, ER+ / PR+ / Her2-, Grade 1: s/p right breat lumpectomy without SLN evaluation   Narrative:  The patient returns today to discuss radiation treatment options. She was seen in the multidisciplinary breast clinic on 08/24/23.   She underwent genetic testing on her consultation date. Results showed no clinically significant variants detected by BRCAplus or +RNAinsight testing.  Since her consultation date, she opted to proceed with a right breast lumpectomy without nodal biopsies on 09/14/23 under the care of Dr. Luisa Hart. Pathology from the procedure revealed: tumor the size of 1.4 cm; histology of grade 1 invasive ductal carcinoma; all margins negative for invasive carcinoma. Prognostic indicators significant for: estrogen receptor 100% positive and progesterone receptor 50% positive, both with strong staining intensity; Proliferation marker Ki67 at 5%; Her2 status negative by IHC; Grade 1.   Oncotype DX was obtained on the final surgical sample. Results showed a recurrence score of 23, which predicts no significant benefit from chemotherapy based on the patient's age and low risk of recurrence over the next 9 years if the patient's only systemic therapy is an antiestrogen for 5 years.   Based on negative Oncotype testing results, chemotherapy in not indicated and she will proceed with antiestrogen therapy. During her breast clinic consultation with Dr. Al Pimple, the options of Anastrozole, Letrozole, or Exemestane x 5 years were reviewed. She will return to Dr. Al Pimple on 10/13/23 to discuss these options further.   On review of systems, the patient reports ***. She denies *** and any  other symptoms.   Allergies:  is allergic to latex, aspirin, and prilosec [omeprazole].  Meds: Current Outpatient Medications  Medication Sig Dispense Refill   ALPRAZolam (XANAX) 0.5 MG tablet TAKE ONE HALF TO ONE TABLET BY MOUTH DAILY AS NEEDED FOR ANXIETY 30 tablet 0   Ascorbic Acid (VITAMIN C) 1000 MG tablet Take 1,000 mg by mouth daily.     azelastine (ASTELIN) 0.1 % nasal spray Place 1-2 sprays into both nostrils 2 (two) times daily as needed for rhinitis. 30 mL 12   B Complex Vitamins (VITAMIN B COMPLEX PO) Take 1 tablet by mouth every other day.     carboxymethylcellulose (REFRESH PLUS) 0.5 % SOLN Place 1 drop into both eyes in the morning and at bedtime.     Cholecalciferol (VITAMIN D-3) 125 MCG (5000 UT) TABS Take 5,000 Units by mouth every other day.     clidinium-chlordiazePOXIDE (LIBRAX) 5-2.5 MG capsule Take 1 capsule by mouth 3 (three) times daily as needed (esophageal spasms). 60 capsule 1   cyclobenzaprine (FLEXERIL) 10 MG tablet Take 0.5-1 tablets (5-10 mg total) by mouth 3 (three) times daily as needed for muscle spasms. 60 tablet 1   Fluocinolone Acetonide Scalp 0.01 % OIL Apply 1 application topically daily as needed for irritation.     fluticasone (FLONASE) 50 MCG/ACT nasal spray Place 1-2 sprays into both nostrils daily as needed for allergies or rhinitis.     HYDROcodone-acetaminophen (NORCO/VICODIN) 5-325 MG tablet Take 1 tablet by mouth 3 (three) times daily as needed for moderate pain (sedation caution). 15 tablet 0   L-LYSINE PO Take 1 capsule by mouth daily.     Multiple Vitamins-Minerals (PRESERVISION AREDS 2  PO) Take 1 capsule by mouth in the morning and at bedtime.     naproxen sodium (ALEVE) 220 MG tablet Take 220 mg by mouth daily as needed (pain).     olopatadine (PATADAY) 0.1 % ophthalmic solution Place 1 drop into both eyes 2 (two) times daily as needed for allergies.     Omega-3 Fatty Acids (OMEGA 3 PO) Take 1 capsule by mouth every other day.      tolterodine (DETROL LA) 2 MG 24 hr capsule Take 1 capsule (2 mg total) by mouth daily. 30 capsule 5   zinc gluconate 50 MG tablet Take 50 mg by mouth daily as needed (cold symptoms).     No current facility-administered medications for this encounter.    Physical Findings: The patient is in no acute distress. Patient is alert and oriented.  vitals were not taken for this visit.  No significant changes. Lungs are clear to auscultation bilaterally. Heart has regular rate and rhythm. No palpable cervical, supraclavicular, or axillary adenopathy. Abdomen soft, non-tender, normal bowel sounds. Left Breast: no palpable mass, nipple discharge or bleeding. Right Breast: ***  Lab Findings: Lab Results  Component Value Date   WBC 6.7 08/24/2023   HGB 12.2 08/24/2023   HCT 38.2 08/24/2023   MCV 85.7 08/24/2023   PLT 277 08/24/2023    Radiographic Findings: No results found.  Impression:  Stage IA (cT1a, cN0, cM0) Central Right Breast, Invasive Ductal Carcinoma, ER+ / PR+ / Her2-, Grade 1: s/p right breat lumpectomy without SLN evaluation   ***  Plan:  Patient is scheduled for CT simulation {date/later today}. ***  -----------------------------------  Billie Lade, PhD, MD  This document serves as a record of services personally performed by Antony Blackbird, MD. It was created on his behalf by Neena Rhymes, a trained medical scribe. The creation of this record is based on the scribe's personal observations and the provider's statements to them. This document has been checked and approved by the attending provider.

## 2023-10-11 NOTE — Progress Notes (Signed)
Location of Breast Cancer:right breast    Histology per Pathology Report:    Receptor Status: ER(positive), PR (positive), Her2-neu (negative), Ki-67 5%  Did patient present with symptoms (if so, please note symptoms) or was this found on screening mammography?: screening mammogram.   Past/Anticipated interventions by surgeon, if [id number]    Past/Anticipated interventions by medical oncology, if any: Patient to see Dr. Radene Journey on 10/13/23  Lymphedema issues, if any:  no   Pain issues, if any:  Denies pain today, reports some tenderness to right breast yesterday.   SAFETY ISSUES: Prior radiation? no Pacemaker/ICD? no Possible current pregnancy?no Is the patient on methotrexate? no  Current Complaints / other details:    BP 138/80 (BP Location: Left Arm, Patient Position: Sitting)   Pulse 82   Temp (!) 97.1 F (36.2 C) (Temporal)   Resp 18   Ht 5\' 7"  (1.702 m)   Wt 189 lb 4 oz (85.8 kg)   SpO2 99%   BMI 29.64 kg/m

## 2023-10-12 ENCOUNTER — Ambulatory Visit
Admission: RE | Admit: 2023-10-12 | Discharge: 2023-10-12 | Disposition: A | Discharge status: Home / Self Care | Payer: Medicare Other | Source: Ambulatory Visit | Attending: Radiation Oncology | Admitting: Radiation Oncology

## 2023-10-12 ENCOUNTER — Encounter: Payer: Self-pay | Admitting: Radiation Oncology

## 2023-10-12 ENCOUNTER — Encounter (HOSPITAL_COMMUNITY): Payer: Self-pay

## 2023-10-12 VITALS — BP 138/80 | HR 82 | Temp 97.1°F | Resp 18 | Ht 67.0 in | Wt 189.2 lb

## 2023-10-12 DIAGNOSIS — C50111 Malignant neoplasm of central portion of right female breast: Secondary | ICD-10-CM | POA: Insufficient documentation

## 2023-10-12 DIAGNOSIS — Z170 Estrogen receptor positive status [ER+]: Secondary | ICD-10-CM

## 2023-10-12 DIAGNOSIS — Z79899 Other long term (current) drug therapy: Secondary | ICD-10-CM | POA: Insufficient documentation

## 2023-10-12 DIAGNOSIS — Z510 Encounter for antineoplastic radiation therapy: Secondary | ICD-10-CM | POA: Diagnosis not present

## 2023-10-13 ENCOUNTER — Inpatient Hospital Stay: Payer: Medicare Other | Attending: Hematology and Oncology | Admitting: Hematology and Oncology

## 2023-10-13 VITALS — BP 143/82 | HR 87 | Temp 98.6°F | Resp 18 | Wt 187.4 lb

## 2023-10-13 DIAGNOSIS — C50111 Malignant neoplasm of central portion of right female breast: Secondary | ICD-10-CM | POA: Diagnosis not present

## 2023-10-13 DIAGNOSIS — Z780 Asymptomatic menopausal state: Secondary | ICD-10-CM

## 2023-10-13 DIAGNOSIS — Z170 Estrogen receptor positive status [ER+]: Secondary | ICD-10-CM | POA: Insufficient documentation

## 2023-10-13 DIAGNOSIS — Z800 Family history of malignant neoplasm of digestive organs: Secondary | ICD-10-CM | POA: Diagnosis not present

## 2023-10-13 DIAGNOSIS — Z87891 Personal history of nicotine dependence: Secondary | ICD-10-CM | POA: Diagnosis not present

## 2023-10-13 DIAGNOSIS — Z8052 Family history of malignant neoplasm of bladder: Secondary | ICD-10-CM | POA: Diagnosis not present

## 2023-10-13 DIAGNOSIS — Z13820 Encounter for screening for osteoporosis: Secondary | ICD-10-CM

## 2023-10-13 NOTE — Progress Notes (Signed)
[facility] CONSULT NOTE  Patient Care Team: Joaquim Nam, MD as PCP - General Dasher, Fayrene Fearing, MD (Surgery) Charna Elizabeth, MD as Consulting Physician (Gastroenterology) Dione Booze, Bertram Millard, MD as Consulting Physician (Ophthalmology) Rachel Moulds, MD as Consulting Physician (Hematology and Oncology) Harriette Bouillon, MD as Consulting Physician (General Surgery) Antony Blackbird, MD as Consulting Physician (Radiation Oncology) Pershing Proud, RN as Oncology Nurse Navigator Donnelly Angelica, RN as Oncology Nurse Navigator  CHIEF COMPLAINTS/PURPOSE OF CONSULTATION:  Newly diagnosed breast cancer  HISTORY OF PRESENTING ILLNESS:  Dana Harding 72 y.o. female is here because of recent diagnosis of right breast IDC  I reviewed her records extensively and collaborated the history with the patient.  SUMMARY OF ONCOLOGIC HISTORY: Oncology History  Malignant neoplasm of central portion of right breast in female, estrogen receptor positive (HCC)  08/10/2023 Mammogram   Mammogram showed architectural distortion in the right breast. Diagnostic mammo showed 0.4 cm central lucent architectural distortion in the right breast. Stereotactic biopsy is recommended.   08/18/2023 Pathology Results    1. Breast, right, needle core biopsy, mass retroareolar :       - INVASIVE DUCTAL CARCINOMA, SEE NOTE       - TUBULE FORMATION: SCORE 2       - NUCLEAR PLEOMORPHISM: SCORE 2       - MITOTIC COUNT: SCORE 1       - TOTAL SCORE: 5       - OVERALL GRADE: 1       - LYMPHOVASCULAR INVASION: NOT IDENTIFIED       - CANCER LENGTH: 0.5 CM       - CALCIFICATIONS: NOT IDENTIFIED   Results:  IMMUNOHISTOCHEMICAL AND MORPHOMETRIC ANALYSIS PERFORMED MANUALLY  The tumor cells are NEGATIVE for Her2 (1+).  Estrogen Receptor:  100%, POSITIVE, STRONG STAINING INTENSITY  Progesterone Receptor:  50%, POSITIVE, STRONG STAINING INTENSITY  Proliferation Marker Ki67:  5%    08/22/2023 Initial Diagnosis    Malignant neoplasm of central portion of right breast in female, estrogen receptor positive (HCC)   08/24/2023 Cancer Staging   Staging form: Breast, AJCC 8th Edition - Clinical stage from 08/24/2023: Stage IA (cT1a, cN0, cM0, G1, ER+, PR+, HER2-) - Signed by Rachel Moulds, MD on 08/24/2023 Stage prefix: Initial diagnosis Histologic grading system: 3 grade system Laterality: Right Staged by: Pathologist and managing physician Stage used in treatment planning: Yes National guidelines used in treatment planning: Yes Type of national guideline used in treatment planning: NCCN    Genetic Testing   [facility] CancerNext-Expanded Panel+RNA was Negative. Report date is 09/01/2023.   The CancerNext-Expanded gene panel offered by [facility] and includes sequencing, rearrangement, and RNA analysis for the following 71 genes: AIP, ALK, APC, ATM, AXIN2, BAP1, BARD1, BMPR1A, BRCA1, BRCA2, BRIP1, CDC73, CDH1, CDK4, CDKN1B, CDKN2A, CHEK2, CTNNA1, DICER1, FH, FLCN, KIF1B, LZTR1, MAX, MEN1, MET, MLH1, MSH2, MSH3, MSH6, MUTYH, NF1, NF2, NTHL1, PALB2, PHOX2B, PMS2, POT1, PRKAR1A, PTCH1, PTEN, RAD51C, RAD51D, RB1, RET, SDHA, SDHAF2, SDHB, SDHC, SDHD, SMAD4, SMARCA4, SMARCB1, SMARCE1, STK11, SUFU, TMEM127, TP53, TSC1, TSC2, and VHL (sequencing and deletion/duplication); EGFR, EGLN1, HOXB13, KIT, MITF, PDGFRA, POLD1, and POLE (sequencing only); EPCAM and GREM1 (deletion/duplication only).     Discussed the use of AI scribe software for clinical note transcription with the patient, who gave verbal consent to proceed.  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   bilateral articular L4-5; per MRI   Family history of malignant neoplasm of breast    Fitting  and adjustment of gastric lap band    GERD (gastroesophageal reflux disease)    IBS (irritable bowel syndrome)    Inflammatory arthritis    H/o elevated ANA, treated with plaquenil   Lumbar foraminal stenosis 02/09/2007   mild; L4-5; per MRI   Mitral regurgitation 03/23/2007   mild   Obesity    Obstructive sleep apnea (adult) (pediatric)    prev used CPAP, resolved and off CPAP as of 2016   Tricuspid regurgitation 03/23/2007   mild to moderate    SURGICAL HISTORY: Past Surgical History:  Procedure Laterality Date   BLEPHAROPLASTY     BREAST LUMPECTOMY WITH RADIOACTIVE SEED LOCALIZATION Right 09/14/2023   Procedure: RIGHT BREAST LUMPECTOMY WITH RADIOACTIVE SEED LOCALIZATION;  Surgeon: Harriette Bouillon, MD;  Location: [facility] OR;  Service: General;  Laterality: Right;   BREAST REDUCTION SURGERY  2009   CATARACT EXTRACTION Left 01/01/2019   CATARACT EXTRACTION W/ INTRAOCULAR LENS IMPLANT Right 12/11/2018   CHOLECYSTECTOMY  1980's   COSMETIC SURGERY  2021   lower blepharoplasty   EYE SURGERY  2020   cataract surgery   INGUINAL HERNIA REPAIR Bilateral    x2   LAPAROSCOPIC GASTRIC BANDING  2007   removed   SHOULDER ARTHROSCOPY WITH ROTATOR CUFF REPAIR AND SUBACROMIAL DECOMPRESSION Left 02/22/2013   Procedure: LEFT SHOULDER ARTHROSCOPY WITH SUBACROMIAL DECOMPRESSION DISTAL CLAVICLE RESECTION with ROTATOR CUFF REPAIR  ;  Surgeon: Senaida Lange, MD;  Location: [facility] OR;  Service: Orthopedics;  Laterality: Left;   TONSILLECTOMY AND ADENOIDECTOMY  1960   tummy tuck  1996   UMBILICAL HERNIA REPAIR     As a child    SOCIAL HISTORY: Social History   Socioeconomic History   Marital status: Single    Spouse name: Not on file   Number of children: 1   Years of education: Not on file   Highest education level: Professional school degree (e.g., MD, DDS, DVM, JD)  Occupational History   Not on file  Tobacco Use   Smoking status: Former    Current packs/day: 0.00    Types: Cigarettes     Quit date: 12/06/1978    Years since quitting: 44.8   Smokeless tobacco: Never  Vaping Use   Vaping status: Never Used  Substance and Sexual Activity   Alcohol use: Yes    Alcohol/week: 2.0 standard drinks of alcohol    Types: 1 Glasses of wine, 1 Standard drinks or equivalent per week    Comment: Socially   Drug use: No   Sexual activity: Not Currently    Birth control/protection: Post-menopausal  Other Topics Concern   Not on file  Social History Narrative   Single   1 daughter-family physician   Undergrad at [employer] and law degree at [employer]; 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   Social Determinants of Health   Financial Resource Strain: Low Risk  (09/15/2023)   Overall Financial Resource Strain (CARDIA)    Difficulty of Paying Living Expenses: Not hard at all  Food Insecurity: No Food Insecurity (10/12/2023)   Hunger Vital Sign    Worried About Running Out of Food in the  Last Year: Never true    Ran Out of Food in the Last Year: Never true  Transportation Needs: No Transportation Needs (10/12/2023)   PRAPARE - Administrator, Civil Service (Medical): No    Lack of Transportation (Non-Medical): No  Physical Activity: Insufficiently Active (09/15/2023)   Exercise Vital Sign    Days of Exercise per Week: 2 days    Minutes of Exercise per Session: 40 min  Stress: No Stress Concern Present (09/15/2023)   [employer] of Occupational Health - Occupational Stress Questionnaire    Feeling of Stress : Not at all  Social Connections: Socially Isolated (09/15/2023)   Social Connection and Isolation Panel [NHANES]    Frequency of Communication with Friends and Family: Once a week    Frequency of Social Gatherings with Friends and Family: Twice a week    Attends Religious Services: Never    Database administrator or Organizations: No    Attends Banker Meetings: Never    Marital Status: Divorced  Catering manager Violence: Not At Risk (10/12/2023)    Humiliation, Afraid, Rape, and Kick questionnaire    Fear of Current or Ex-Partner: No    Emotionally Abused: No    Physically Abused: No    Sexually Abused: No    FAMILY HISTORY: Family History  Problem Relation Age of Onset   Breast cancer Mother 2       bilateral   Cancer Mother    Stroke Father        < 60   Heart disease Father    Kidney disease Father    Bladder Cancer Sister 60   Cancer Sister 39       Neuroendocrine tumor found incidentally in the small intestine   Colon cancer Paternal Uncle     ALLERGIES:  is allergic to latex, aspirin, and prilosec [omeprazole].  MEDICATIONS:  Current Outpatient Medications  Medication Sig Dispense Refill   ALPRAZolam (XANAX) 0.5 MG tablet TAKE ONE HALF TO ONE TABLET BY MOUTH DAILY AS NEEDED FOR ANXIETY 30 tablet 0   Ascorbic Acid (VITAMIN C) 1000 MG tablet Take 1,000 mg by mouth daily.     azelastine (ASTELIN) 0.1 % nasal spray Place 1-2 sprays into both nostrils 2 (two) times daily as needed for rhinitis. 30 mL 12   B Complex Vitamins (VITAMIN B COMPLEX PO) Take 1 tablet by mouth every other day.     carboxymethylcellulose (REFRESH PLUS) 0.5 % SOLN Place 1 drop into both eyes in the morning and at bedtime.     Cholecalciferol (VITAMIN D-3) 125 MCG (5000 UT) TABS Take 2,000 Units by mouth every other day.     clidinium-chlordiazePOXIDE (LIBRAX) 5-2.5 MG capsule Take 1 capsule by mouth 3 (three) times daily as needed (esophageal spasms). 60 capsule 1   cyclobenzaprine (FLEXERIL) 10 MG tablet Take 0.5-1 tablets (5-10 mg total) by mouth 3 (three) times daily as needed for muscle spasms. 60 tablet 1   Fluocinolone Acetonide Scalp 0.01 % OIL Apply 1 application topically daily as needed for irritation.     fluticasone (FLONASE) 50 MCG/ACT nasal spray Place 1-2 sprays into both nostrils daily as needed for allergies or rhinitis. (Patient not taking: Reported on 10/12/2023)     HYDROcodone-acetaminophen (NORCO/VICODIN) 5-325 MG tablet Take  1 tablet by mouth 3 (three) times daily as needed for moderate pain (sedation caution). 15 tablet 0   L-LYSINE PO Take 1 capsule by mouth daily.     Multiple Vitamins-Minerals (  PRESERVISION AREDS 2 PO) Take 1 capsule by mouth in the morning and at bedtime.     naproxen sodium (ALEVE) 220 MG tablet Take 220 mg by mouth daily as needed (pain). (Patient not taking: Reported on 10/12/2023)     olopatadine (PATADAY) 0.1 % ophthalmic solution Place 1 drop into both eyes 2 (two) times daily as needed for allergies.     Omega-3 Fatty Acids (OMEGA 3 PO) Take 1 capsule by mouth every other day. (Patient not taking: Reported on 10/12/2023)     tolterodine (DETROL LA) 2 MG 24 hr capsule Take 1 capsule (2 mg total) by mouth daily. (Patient not taking: Reported on 10/12/2023) 30 capsule 5   zinc gluconate 50 MG tablet Take 50 mg by mouth daily as needed (cold symptoms).     No current facility-administered medications for this visit.    REVIEW OF SYSTEMS:   Constitutional: Denies fevers, chills or abnormal night sweats Eyes: Denies blurriness of vision, double vision or watery eyes Ears, nose, mouth, throat, and face: Denies mucositis or sore throat Respiratory: Denies cough, dyspnea or wheezes Cardiovascular: Denies palpitation, chest discomfort or lower extremity swelling Gastrointestinal:  Denies nausea, heartburn or change in bowel habits Skin: Denies abnormal skin rashes Lymphatics: Denies new lymphadenopathy or easy bruising Neurological:Denies numbness, tingling or new weaknesses Behavioral/Psych: Mood is stable, no new changes  Breast: Denies any palpable lumps or discharge All other systems were reviewed with the patient and are negative.  PHYSICAL EXAMINATION: ECOG PERFORMANCE STATUS: 0 - Asymptomatic  Vitals:   10/13/23 1137  BP: (!) 143/82  Pulse: 87  Resp: 18  Temp: 98.6 F (37 C)  SpO2: 100%   Filed Weights   10/13/23 1137  Weight: 187 lb 6.4 oz (85 kg)    GENERAL:alert, no  distress and comfortable SKIN: skin color, texture, turgor are normal, no rashes or significant lesions EYES: normal, conjunctiva are pink and non-injected, sclera clear OROPHARYNX:no exudate, no erythema and lips, buccal mucosa, and tongue normal  NECK: supple, thyroid normal size, non-tender, without nodularity LYMPH:  no palpable lymphadenopathy in the cervical, axillary or inguinal LUNGS: clear to auscultation and percussion with normal breathing effort HEART: regular rate & rhythm and no murmurs and no lower extremity edema ABDOMEN:abdomen soft, non-tender and normal bowel sounds Musculoskeletal:no cyanosis of digits and no clubbing  PSYCH: alert & oriented x 3 with fluent speech NEURO: no focal motor/sensory deficits BREAST: Bilateral breast inspected and palpated.  Right breast with postbiopsy changes, no mass, nipple changes.  No regional adenopathy.  Left breast   LABORATORY DATA:  I have reviewed the data as listed Lab Results  Component Value Date   WBC 6.7 08/24/2023   HGB 12.2 08/24/2023   HCT 38.2 08/24/2023   MCV 85.7 08/24/2023   PLT 277 08/24/2023   Lab Results  Component Value Date   NA 140 08/24/2023   K 4.4 08/24/2023   CL 105 08/24/2023   CO2 30 08/24/2023    RADIOGRAPHIC STUDIES: I have personally reviewed the radiological reports and agreed with the findings in the report.  ASSESSMENT AND PLAN:  No problem-specific Assessment & Plan notes found for this encounter.  All questions were answered. The patient knows to call the clinic with any problems, questions or concerns.    Rachel Moulds, MD 10/13/23

## 2023-10-15 DIAGNOSIS — M25552 Pain in left hip: Secondary | ICD-10-CM | POA: Diagnosis not present

## 2023-10-15 DIAGNOSIS — Z23 Encounter for immunization: Secondary | ICD-10-CM | POA: Diagnosis not present

## 2023-10-15 DIAGNOSIS — M25551 Pain in right hip: Secondary | ICD-10-CM | POA: Diagnosis not present

## 2023-10-18 ENCOUNTER — Encounter: Payer: Self-pay | Admitting: *Deleted

## 2023-10-18 DIAGNOSIS — C50111 Malignant neoplasm of central portion of right female breast: Secondary | ICD-10-CM

## 2023-10-18 DIAGNOSIS — Z170 Estrogen receptor positive status [ER+]: Secondary | ICD-10-CM

## 2023-10-24 ENCOUNTER — Ambulatory Visit: Payer: Medicare Other | Admitting: Radiation Oncology

## 2023-10-24 DIAGNOSIS — C50111 Malignant neoplasm of central portion of right female breast: Secondary | ICD-10-CM | POA: Diagnosis not present

## 2023-10-24 DIAGNOSIS — Z170 Estrogen receptor positive status [ER+]: Secondary | ICD-10-CM | POA: Diagnosis not present

## 2023-10-24 DIAGNOSIS — Z510 Encounter for antineoplastic radiation therapy: Secondary | ICD-10-CM | POA: Diagnosis not present

## 2023-10-25 ENCOUNTER — Ambulatory Visit
Admission: RE | Admit: 2023-10-25 | Discharge: 2023-10-25 | Disposition: A | Discharge status: Home / Self Care | Payer: Medicare Other | Source: Ambulatory Visit | Attending: Radiation Oncology | Admitting: Radiation Oncology

## 2023-10-25 ENCOUNTER — Other Ambulatory Visit: Payer: Self-pay

## 2023-10-25 DIAGNOSIS — Z170 Estrogen receptor positive status [ER+]: Secondary | ICD-10-CM

## 2023-10-25 DIAGNOSIS — C50111 Malignant neoplasm of central portion of right female breast: Secondary | ICD-10-CM

## 2023-10-25 DIAGNOSIS — Z510 Encounter for antineoplastic radiation therapy: Secondary | ICD-10-CM | POA: Diagnosis not present

## 2023-10-25 DIAGNOSIS — L720 Epidermal cyst: Secondary | ICD-10-CM

## 2023-10-25 LAB — RAD ONC ARIA SESSION SUMMARY
Course Elapsed Days: 0
Plan Fractions Treated to Date: 1
Plan Prescribed Dose Per Fraction: 2.67 Gy
Plan Total Fractions Prescribed: 15
Plan Total Prescribed Dose: 40.05 Gy
Reference Point Dosage Given to Date: 2.67 Gy
Reference Point Session Dosage Given: 2.67 Gy
Session Number: 1

## 2023-10-25 MED ORDER — RADIAPLEXRX EX GEL: Freq: Once | CUTANEOUS | Status: AC

## 2023-10-25 MED ORDER — ALRA NON-METALLIC DEODORANT (RAD-ONC)
1.0000 | Freq: Once | TOPICAL | Status: AC
  Administered 2023-10-25: 1 via TOPICAL

## 2023-10-26 ENCOUNTER — Other Ambulatory Visit: Payer: Self-pay

## 2023-10-26 ENCOUNTER — Ambulatory Visit
Admission: RE | Admit: 2023-10-26 | Discharge: 2023-10-26 | Disposition: A | Discharge status: Home / Self Care | Payer: Medicare Other | Source: Ambulatory Visit | Attending: Radiation Oncology | Admitting: Radiation Oncology

## 2023-10-26 DIAGNOSIS — Z510 Encounter for antineoplastic radiation therapy: Secondary | ICD-10-CM | POA: Diagnosis not present

## 2023-10-26 DIAGNOSIS — Z170 Estrogen receptor positive status [ER+]: Secondary | ICD-10-CM | POA: Diagnosis not present

## 2023-10-26 DIAGNOSIS — C50111 Malignant neoplasm of central portion of right female breast: Secondary | ICD-10-CM | POA: Diagnosis not present

## 2023-10-26 LAB — RAD ONC ARIA SESSION SUMMARY
Course Elapsed Days: 1
Plan Fractions Treated to Date: 2
Plan Prescribed Dose Per Fraction: 2.67 Gy
Plan Total Fractions Prescribed: 15
Plan Total Prescribed Dose: 40.05 Gy
Reference Point Dosage Given to Date: 5.34 Gy
Reference Point Session Dosage Given: 2.67 Gy
Session Number: 2

## 2023-10-27 ENCOUNTER — Other Ambulatory Visit: Payer: Self-pay

## 2023-10-27 ENCOUNTER — Ambulatory Visit
Admission: RE | Admit: 2023-10-27 | Discharge: 2023-10-27 | Disposition: A | Discharge status: Home / Self Care | Payer: Medicare Other | Source: Ambulatory Visit | Attending: Radiation Oncology | Admitting: Radiation Oncology

## 2023-10-27 DIAGNOSIS — Z510 Encounter for antineoplastic radiation therapy: Secondary | ICD-10-CM | POA: Diagnosis not present

## 2023-10-27 DIAGNOSIS — Z170 Estrogen receptor positive status [ER+]: Secondary | ICD-10-CM | POA: Diagnosis not present

## 2023-10-27 DIAGNOSIS — C50111 Malignant neoplasm of central portion of right female breast: Secondary | ICD-10-CM | POA: Diagnosis not present

## 2023-10-27 LAB — RAD ONC ARIA SESSION SUMMARY
Course Elapsed Days: 2
Plan Fractions Treated to Date: 3
Plan Prescribed Dose Per Fraction: 2.67 Gy
Plan Total Fractions Prescribed: 15
Plan Total Prescribed Dose: 40.05 Gy
Reference Point Dosage Given to Date: 8.01 Gy
Reference Point Session Dosage Given: 2.67 Gy
Session Number: 3

## 2023-10-28 ENCOUNTER — Other Ambulatory Visit: Payer: Self-pay

## 2023-10-28 ENCOUNTER — Ambulatory Visit
Admission: RE | Admit: 2023-10-28 | Discharge: 2023-10-28 | Disposition: A | Discharge status: Home / Self Care | Payer: Medicare Other | Source: Ambulatory Visit | Attending: Radiation Oncology | Admitting: Radiation Oncology

## 2023-10-28 DIAGNOSIS — Z510 Encounter for antineoplastic radiation therapy: Secondary | ICD-10-CM | POA: Diagnosis not present

## 2023-10-28 DIAGNOSIS — C50111 Malignant neoplasm of central portion of right female breast: Secondary | ICD-10-CM | POA: Diagnosis not present

## 2023-10-28 DIAGNOSIS — Z170 Estrogen receptor positive status [ER+]: Secondary | ICD-10-CM | POA: Diagnosis not present

## 2023-10-28 LAB — RAD ONC ARIA SESSION SUMMARY
Course Elapsed Days: 3
Plan Fractions Treated to Date: 4
Plan Prescribed Dose Per Fraction: 2.67 Gy
Plan Total Fractions Prescribed: 15
Plan Total Prescribed Dose: 40.05 Gy
Reference Point Dosage Given to Date: 10.68 Gy
Reference Point Session Dosage Given: 2.67 Gy
Session Number: 4

## 2023-10-30 ENCOUNTER — Other Ambulatory Visit: Payer: Self-pay

## 2023-10-30 ENCOUNTER — Ambulatory Visit
Admission: RE | Admit: 2023-10-30 | Discharge: 2023-10-30 | Disposition: A | Discharge status: Home / Self Care | Payer: Medicare Other | Source: Ambulatory Visit | Attending: Radiation Oncology | Admitting: Radiation Oncology

## 2023-10-30 DIAGNOSIS — Z170 Estrogen receptor positive status [ER+]: Secondary | ICD-10-CM | POA: Diagnosis not present

## 2023-10-30 DIAGNOSIS — Z510 Encounter for antineoplastic radiation therapy: Secondary | ICD-10-CM | POA: Diagnosis not present

## 2023-10-30 DIAGNOSIS — C50111 Malignant neoplasm of central portion of right female breast: Secondary | ICD-10-CM | POA: Diagnosis not present

## 2023-10-30 LAB — RAD ONC ARIA SESSION SUMMARY
Course Elapsed Days: 5
Plan Fractions Treated to Date: 5
Plan Prescribed Dose Per Fraction: 2.67 Gy
Plan Total Fractions Prescribed: 15
Plan Total Prescribed Dose: 40.05 Gy
Reference Point Dosage Given to Date: 13.35 Gy
Reference Point Session Dosage Given: 2.67 Gy
Session Number: 5

## 2023-10-31 ENCOUNTER — Other Ambulatory Visit: Payer: Self-pay

## 2023-10-31 ENCOUNTER — Ambulatory Visit
Admission: RE | Admit: 2023-10-31 | Discharge: 2023-10-31 | Disposition: A | Discharge status: Home / Self Care | Payer: Medicare Other | Source: Ambulatory Visit | Attending: Radiation Oncology | Admitting: Radiation Oncology

## 2023-10-31 DIAGNOSIS — C50111 Malignant neoplasm of central portion of right female breast: Secondary | ICD-10-CM | POA: Diagnosis not present

## 2023-10-31 DIAGNOSIS — Z170 Estrogen receptor positive status [ER+]: Secondary | ICD-10-CM | POA: Diagnosis not present

## 2023-10-31 DIAGNOSIS — Z510 Encounter for antineoplastic radiation therapy: Secondary | ICD-10-CM | POA: Diagnosis not present

## 2023-10-31 LAB — RAD ONC ARIA SESSION SUMMARY
Course Elapsed Days: 6
Plan Fractions Treated to Date: 6
Plan Prescribed Dose Per Fraction: 2.67 Gy
Plan Total Fractions Prescribed: 15
Plan Total Prescribed Dose: 40.05 Gy
Reference Point Dosage Given to Date: 16.02 Gy
Reference Point Session Dosage Given: 2.67 Gy
Session Number: 6

## 2023-11-01 ENCOUNTER — Other Ambulatory Visit: Payer: Self-pay

## 2023-11-01 ENCOUNTER — Ambulatory Visit
Admission: RE | Admit: 2023-11-01 | Discharge: 2023-11-01 | Disposition: A | Discharge status: Home / Self Care | Payer: Medicare Other | Source: Ambulatory Visit | Attending: Radiation Oncology | Admitting: Radiation Oncology

## 2023-11-01 DIAGNOSIS — C50111 Malignant neoplasm of central portion of right female breast: Secondary | ICD-10-CM | POA: Diagnosis not present

## 2023-11-01 DIAGNOSIS — Z510 Encounter for antineoplastic radiation therapy: Secondary | ICD-10-CM | POA: Diagnosis not present

## 2023-11-01 DIAGNOSIS — Z170 Estrogen receptor positive status [ER+]: Secondary | ICD-10-CM | POA: Diagnosis not present

## 2023-11-01 LAB — RAD ONC ARIA SESSION SUMMARY
Course Elapsed Days: 7
Plan Fractions Treated to Date: 7
Plan Prescribed Dose Per Fraction: 2.67 Gy
Plan Total Fractions Prescribed: 15
Plan Total Prescribed Dose: 40.05 Gy
Reference Point Dosage Given to Date: 18.69 Gy
Reference Point Session Dosage Given: 2.67 Gy
Session Number: 7

## 2023-11-02 ENCOUNTER — Other Ambulatory Visit: Payer: Self-pay

## 2023-11-02 ENCOUNTER — Ambulatory Visit
Admission: RE | Admit: 2023-11-02 | Discharge: 2023-11-02 | Disposition: A | Discharge status: Home / Self Care | Payer: Medicare Other | Source: Ambulatory Visit | Attending: Radiation Oncology | Admitting: Radiation Oncology

## 2023-11-02 DIAGNOSIS — Z170 Estrogen receptor positive status [ER+]: Secondary | ICD-10-CM | POA: Diagnosis not present

## 2023-11-02 DIAGNOSIS — Z510 Encounter for antineoplastic radiation therapy: Secondary | ICD-10-CM | POA: Diagnosis not present

## 2023-11-02 DIAGNOSIS — C50111 Malignant neoplasm of central portion of right female breast: Secondary | ICD-10-CM | POA: Diagnosis not present

## 2023-11-02 LAB — RAD ONC ARIA SESSION SUMMARY
Course Elapsed Days: 8
Plan Fractions Treated to Date: 8
Plan Prescribed Dose Per Fraction: 2.67 Gy
Plan Total Fractions Prescribed: 15
Plan Total Prescribed Dose: 40.05 Gy
Reference Point Dosage Given to Date: 21.36 Gy
Reference Point Session Dosage Given: 2.67 Gy
Session Number: 8

## 2023-11-07 ENCOUNTER — Ambulatory Visit
Admission: RE | Admit: 2023-11-07 | Discharge: 2023-11-07 | Disposition: A | Discharge status: Home / Self Care | Payer: Medicare Other | Source: Ambulatory Visit | Attending: Radiation Oncology | Admitting: Radiation Oncology

## 2023-11-07 ENCOUNTER — Other Ambulatory Visit: Payer: Self-pay

## 2023-11-07 DIAGNOSIS — Z170 Estrogen receptor positive status [ER+]: Secondary | ICD-10-CM | POA: Insufficient documentation

## 2023-11-07 DIAGNOSIS — C50111 Malignant neoplasm of central portion of right female breast: Secondary | ICD-10-CM | POA: Insufficient documentation

## 2023-11-07 DIAGNOSIS — Z510 Encounter for antineoplastic radiation therapy: Secondary | ICD-10-CM | POA: Diagnosis not present

## 2023-11-07 LAB — RAD ONC ARIA SESSION SUMMARY
Course Elapsed Days: 13
Plan Fractions Treated to Date: 9
Plan Prescribed Dose Per Fraction: 2.67 Gy
Plan Total Fractions Prescribed: 15
Plan Total Prescribed Dose: 40.05 Gy
Reference Point Dosage Given to Date: 24.03 Gy
Reference Point Session Dosage Given: 2.67 Gy
Session Number: 9

## 2023-11-08 ENCOUNTER — Ambulatory Visit: Payer: Medicare Other | Admitting: Radiation Oncology

## 2023-11-08 ENCOUNTER — Other Ambulatory Visit: Payer: Self-pay

## 2023-11-08 ENCOUNTER — Ambulatory Visit
Admission: RE | Admit: 2023-11-08 | Discharge: 2023-11-08 | Disposition: A | Discharge status: Home / Self Care | Payer: Medicare Other | Source: Ambulatory Visit | Attending: Radiation Oncology | Admitting: Radiation Oncology

## 2023-11-08 DIAGNOSIS — Z510 Encounter for antineoplastic radiation therapy: Secondary | ICD-10-CM | POA: Diagnosis not present

## 2023-11-08 DIAGNOSIS — Z170 Estrogen receptor positive status [ER+]: Secondary | ICD-10-CM | POA: Diagnosis not present

## 2023-11-08 DIAGNOSIS — C50111 Malignant neoplasm of central portion of right female breast: Secondary | ICD-10-CM | POA: Diagnosis not present

## 2023-11-08 LAB — RAD ONC ARIA SESSION SUMMARY
Course Elapsed Days: 14
Plan Fractions Treated to Date: 10
Plan Prescribed Dose Per Fraction: 2.67 Gy
Plan Total Fractions Prescribed: 15
Plan Total Prescribed Dose: 40.05 Gy
Reference Point Dosage Given to Date: 26.7 Gy
Reference Point Session Dosage Given: 2.67 Gy
Session Number: 10

## 2023-11-09 ENCOUNTER — Ambulatory Visit
Admission: RE | Admit: 2023-11-09 | Discharge: 2023-11-09 | Disposition: A | Discharge status: Home / Self Care | Payer: Medicare Other | Source: Ambulatory Visit | Attending: Radiation Oncology | Admitting: Radiation Oncology

## 2023-11-09 ENCOUNTER — Other Ambulatory Visit: Payer: Self-pay

## 2023-11-09 DIAGNOSIS — C50111 Malignant neoplasm of central portion of right female breast: Secondary | ICD-10-CM | POA: Diagnosis not present

## 2023-11-09 DIAGNOSIS — Z510 Encounter for antineoplastic radiation therapy: Secondary | ICD-10-CM | POA: Diagnosis not present

## 2023-11-09 DIAGNOSIS — Z170 Estrogen receptor positive status [ER+]: Secondary | ICD-10-CM | POA: Diagnosis not present

## 2023-11-09 LAB — RAD ONC ARIA SESSION SUMMARY
Course Elapsed Days: 15
Plan Fractions Treated to Date: 11
Plan Prescribed Dose Per Fraction: 2.67 Gy
Plan Total Fractions Prescribed: 15
Plan Total Prescribed Dose: 40.05 Gy
Reference Point Dosage Given to Date: 29.37 Gy
Reference Point Session Dosage Given: 2.67 Gy
Session Number: 11

## 2023-11-10 ENCOUNTER — Other Ambulatory Visit: Payer: Self-pay

## 2023-11-10 ENCOUNTER — Ambulatory Visit
Admission: RE | Admit: 2023-11-10 | Discharge: 2023-11-10 | Disposition: A | Discharge status: Home / Self Care | Payer: Medicare Other | Source: Ambulatory Visit | Attending: Radiation Oncology | Admitting: Radiation Oncology

## 2023-11-10 DIAGNOSIS — C50111 Malignant neoplasm of central portion of right female breast: Secondary | ICD-10-CM | POA: Diagnosis not present

## 2023-11-10 DIAGNOSIS — Z170 Estrogen receptor positive status [ER+]: Secondary | ICD-10-CM | POA: Diagnosis not present

## 2023-11-10 DIAGNOSIS — Z510 Encounter for antineoplastic radiation therapy: Secondary | ICD-10-CM | POA: Diagnosis not present

## 2023-11-10 LAB — RAD ONC ARIA SESSION SUMMARY
Course Elapsed Days: 16
Plan Fractions Treated to Date: 12
Plan Prescribed Dose Per Fraction: 2.67 Gy
Plan Total Fractions Prescribed: 15
Plan Total Prescribed Dose: 40.05 Gy
Reference Point Dosage Given to Date: 32.04 Gy
Reference Point Session Dosage Given: 2.67 Gy
Session Number: 12

## 2023-11-11 ENCOUNTER — Other Ambulatory Visit: Payer: Self-pay

## 2023-11-11 ENCOUNTER — Ambulatory Visit
Admission: RE | Admit: 2023-11-11 | Discharge: 2023-11-11 | Disposition: A | Discharge status: Home / Self Care | Payer: Medicare Other | Source: Ambulatory Visit | Attending: Radiation Oncology | Admitting: Radiation Oncology

## 2023-11-11 DIAGNOSIS — Z170 Estrogen receptor positive status [ER+]: Secondary | ICD-10-CM | POA: Diagnosis not present

## 2023-11-11 DIAGNOSIS — C50111 Malignant neoplasm of central portion of right female breast: Secondary | ICD-10-CM | POA: Diagnosis not present

## 2023-11-11 DIAGNOSIS — Z510 Encounter for antineoplastic radiation therapy: Secondary | ICD-10-CM | POA: Diagnosis not present

## 2023-11-11 LAB — RAD ONC ARIA SESSION SUMMARY
Course Elapsed Days: 17
Plan Fractions Treated to Date: 13
Plan Prescribed Dose Per Fraction: 2.67 Gy
Plan Total Fractions Prescribed: 15
Plan Total Prescribed Dose: 40.05 Gy
Reference Point Dosage Given to Date: 34.71 Gy
Reference Point Session Dosage Given: 2.67 Gy
Session Number: 13

## 2023-11-14 ENCOUNTER — Other Ambulatory Visit: Payer: Self-pay | Admitting: Family Medicine

## 2023-11-14 ENCOUNTER — Ambulatory Visit
Admission: RE | Admit: 2023-11-14 | Discharge: 2023-11-14 | Disposition: A | Discharge status: Home / Self Care | Payer: Medicare Other | Source: Ambulatory Visit | Attending: Radiation Oncology | Admitting: Radiation Oncology

## 2023-11-14 ENCOUNTER — Other Ambulatory Visit: Payer: Self-pay

## 2023-11-14 DIAGNOSIS — C50111 Malignant neoplasm of central portion of right female breast: Secondary | ICD-10-CM | POA: Diagnosis not present

## 2023-11-14 DIAGNOSIS — Z170 Estrogen receptor positive status [ER+]: Secondary | ICD-10-CM | POA: Diagnosis not present

## 2023-11-14 DIAGNOSIS — Z510 Encounter for antineoplastic radiation therapy: Secondary | ICD-10-CM | POA: Diagnosis not present

## 2023-11-14 LAB — RAD ONC ARIA SESSION SUMMARY
Course Elapsed Days: 20
Plan Fractions Treated to Date: 14
Plan Prescribed Dose Per Fraction: 2.67 Gy
Plan Total Fractions Prescribed: 15
Plan Total Prescribed Dose: 40.05 Gy
Reference Point Dosage Given to Date: 37.38 Gy
Reference Point Session Dosage Given: 2.67 Gy
Session Number: 14

## 2023-11-14 NOTE — Telephone Encounter (Signed)
Last office visit:09/16/23 Next office visit: nothing scheduled Last refill:ALPRAZolam Oral Tablet 0.5 MG 08/24/23 30 tablets 0 refill

## 2023-11-15 ENCOUNTER — Ambulatory Visit
Admission: RE | Admit: 2023-11-15 | Discharge: 2023-11-15 | Disposition: A | Discharge status: Home / Self Care | Payer: Medicare Other | Source: Ambulatory Visit | Attending: Radiation Oncology | Admitting: Radiation Oncology

## 2023-11-15 ENCOUNTER — Ambulatory Visit: Payer: Medicare Other

## 2023-11-15 ENCOUNTER — Other Ambulatory Visit: Payer: Self-pay

## 2023-11-15 ENCOUNTER — Ambulatory Visit (HOSPITAL_BASED_OUTPATIENT_CLINIC_OR_DEPARTMENT_OTHER)
Admission: RE | Admit: 2023-11-15 | Discharge: 2023-11-15 | Disposition: A | Discharge status: Home / Self Care | Payer: Medicare Other | Source: Ambulatory Visit | Attending: Hematology and Oncology | Admitting: Hematology and Oncology

## 2023-11-15 DIAGNOSIS — Z780 Asymptomatic menopausal state: Secondary | ICD-10-CM | POA: Diagnosis not present

## 2023-11-15 DIAGNOSIS — Z170 Estrogen receptor positive status [ER+]: Secondary | ICD-10-CM | POA: Diagnosis not present

## 2023-11-15 DIAGNOSIS — Z510 Encounter for antineoplastic radiation therapy: Secondary | ICD-10-CM | POA: Diagnosis not present

## 2023-11-15 DIAGNOSIS — C50111 Malignant neoplasm of central portion of right female breast: Secondary | ICD-10-CM | POA: Diagnosis not present

## 2023-11-15 DIAGNOSIS — Z13820 Encounter for screening for osteoporosis: Secondary | ICD-10-CM

## 2023-11-15 DIAGNOSIS — M85851 Other specified disorders of bone density and structure, right thigh: Secondary | ICD-10-CM | POA: Diagnosis not present

## 2023-11-15 LAB — RAD ONC ARIA SESSION SUMMARY
Course Elapsed Days: 21
Plan Fractions Treated to Date: 15
Plan Prescribed Dose Per Fraction: 2.67 Gy
Plan Total Fractions Prescribed: 15
Plan Total Prescribed Dose: 40.05 Gy
Reference Point Dosage Given to Date: 40.05 Gy
Reference Point Session Dosage Given: 2.67 Gy
Session Number: 15

## 2023-11-15 MED ORDER — RADIAPLEXRX EX GEL: Freq: Once | CUTANEOUS | Status: AC

## 2023-11-16 ENCOUNTER — Other Ambulatory Visit: Payer: Self-pay

## 2023-11-16 ENCOUNTER — Ambulatory Visit
Admission: RE | Admit: 2023-11-16 | Discharge: 2023-11-16 | Disposition: A | Discharge status: Home / Self Care | Payer: Medicare Other | Source: Ambulatory Visit | Attending: Radiation Oncology | Admitting: Radiation Oncology

## 2023-11-16 ENCOUNTER — Ambulatory Visit: Payer: Medicare Other

## 2023-11-16 DIAGNOSIS — Z510 Encounter for antineoplastic radiation therapy: Secondary | ICD-10-CM | POA: Diagnosis not present

## 2023-11-16 DIAGNOSIS — C50111 Malignant neoplasm of central portion of right female breast: Secondary | ICD-10-CM | POA: Diagnosis not present

## 2023-11-16 DIAGNOSIS — Z170 Estrogen receptor positive status [ER+]: Secondary | ICD-10-CM | POA: Diagnosis not present

## 2023-11-16 LAB — RAD ONC ARIA SESSION SUMMARY
Course Elapsed Days: 22
Plan Fractions Treated to Date: 1
Plan Prescribed Dose Per Fraction: 2 Gy
Plan Total Fractions Prescribed: 5
Plan Total Prescribed Dose: 10 Gy
Reference Point Dosage Given to Date: 2 Gy
Reference Point Session Dosage Given: 2 Gy
Session Number: 16

## 2023-11-17 ENCOUNTER — Other Ambulatory Visit: Payer: Self-pay

## 2023-11-17 ENCOUNTER — Ambulatory Visit
Admission: RE | Admit: 2023-11-17 | Discharge: 2023-11-17 | Disposition: A | Discharge status: Home / Self Care | Payer: Medicare Other | Source: Ambulatory Visit | Attending: Radiation Oncology | Admitting: Radiation Oncology

## 2023-11-17 ENCOUNTER — Ambulatory Visit: Payer: Medicare Other

## 2023-11-17 DIAGNOSIS — C50111 Malignant neoplasm of central portion of right female breast: Secondary | ICD-10-CM | POA: Diagnosis not present

## 2023-11-17 DIAGNOSIS — Z170 Estrogen receptor positive status [ER+]: Secondary | ICD-10-CM | POA: Diagnosis not present

## 2023-11-17 LAB — RAD ONC ARIA SESSION SUMMARY
Course Elapsed Days: 23
Plan Fractions Treated to Date: 2
Plan Prescribed Dose Per Fraction: 2 Gy
Plan Total Fractions Prescribed: 5
Plan Total Prescribed Dose: 10 Gy
Reference Point Dosage Given to Date: 4 Gy
Reference Point Session Dosage Given: 2 Gy
Session Number: 17

## 2023-11-18 ENCOUNTER — Other Ambulatory Visit: Payer: Self-pay

## 2023-11-18 ENCOUNTER — Ambulatory Visit
Admission: RE | Admit: 2023-11-18 | Discharge: 2023-11-18 | Disposition: A | Discharge status: Home / Self Care | Payer: Medicare Other | Source: Ambulatory Visit | Attending: Radiation Oncology | Admitting: Radiation Oncology

## 2023-11-18 DIAGNOSIS — Z170 Estrogen receptor positive status [ER+]: Secondary | ICD-10-CM | POA: Diagnosis not present

## 2023-11-18 DIAGNOSIS — C50111 Malignant neoplasm of central portion of right female breast: Secondary | ICD-10-CM | POA: Diagnosis not present

## 2023-11-18 LAB — RAD ONC ARIA SESSION SUMMARY
Course Elapsed Days: 24
Plan Fractions Treated to Date: 3
Plan Prescribed Dose Per Fraction: 2 Gy
Plan Total Fractions Prescribed: 5
Plan Total Prescribed Dose: 10 Gy
Reference Point Dosage Given to Date: 6 Gy
Reference Point Session Dosage Given: 2 Gy
Session Number: 18

## 2023-11-21 ENCOUNTER — Ambulatory Visit
Admission: RE | Admit: 2023-11-21 | Discharge: 2023-11-21 | Disposition: A | Discharge status: Home / Self Care | Payer: Medicare Other | Source: Ambulatory Visit | Attending: Radiation Oncology | Admitting: Radiation Oncology

## 2023-11-21 ENCOUNTER — Other Ambulatory Visit: Payer: Self-pay

## 2023-11-21 ENCOUNTER — Ambulatory Visit: Payer: Medicare Other

## 2023-11-21 DIAGNOSIS — C50111 Malignant neoplasm of central portion of right female breast: Secondary | ICD-10-CM | POA: Diagnosis not present

## 2023-11-21 DIAGNOSIS — Z170 Estrogen receptor positive status [ER+]: Secondary | ICD-10-CM | POA: Diagnosis not present

## 2023-11-21 LAB — RAD ONC ARIA SESSION SUMMARY
Course Elapsed Days: 27
Plan Fractions Treated to Date: 4
Plan Prescribed Dose Per Fraction: 2 Gy
Plan Total Fractions Prescribed: 5
Plan Total Prescribed Dose: 10 Gy
Reference Point Dosage Given to Date: 8 Gy
Reference Point Session Dosage Given: 2 Gy
Session Number: 19

## 2023-11-22 ENCOUNTER — Ambulatory Visit
Admission: RE | Admit: 2023-11-22 | Discharge: 2023-11-22 | Disposition: A | Discharge status: Home / Self Care | Payer: Medicare Other | Source: Ambulatory Visit | Attending: Radiation Oncology | Admitting: Radiation Oncology

## 2023-11-22 ENCOUNTER — Other Ambulatory Visit: Payer: Self-pay

## 2023-11-22 ENCOUNTER — Ambulatory Visit: Payer: Medicare Other

## 2023-11-22 DIAGNOSIS — Z170 Estrogen receptor positive status [ER+]: Secondary | ICD-10-CM | POA: Diagnosis not present

## 2023-11-22 DIAGNOSIS — C50111 Malignant neoplasm of central portion of right female breast: Secondary | ICD-10-CM | POA: Diagnosis not present

## 2023-11-22 DIAGNOSIS — Z510 Encounter for antineoplastic radiation therapy: Secondary | ICD-10-CM | POA: Diagnosis not present

## 2023-11-22 LAB — RAD ONC ARIA SESSION SUMMARY
Course Elapsed Days: 28
Plan Fractions Treated to Date: 5
Plan Prescribed Dose Per Fraction: 2 Gy
Plan Total Fractions Prescribed: 5
Plan Total Prescribed Dose: 10 Gy
Reference Point Dosage Given to Date: 10 Gy
Reference Point Session Dosage Given: 2 Gy
Session Number: 20

## 2023-11-23 NOTE — Radiation Completion Notes (Signed)
Patient Name: Dana Harding, Dana Harding MRN: [id number] Date of Birth: 03-Mar-1951 Referring Physician: Crawford Givens, M.D. Date of Service: 2023-11-23 Radiation Oncologist: Arnette Schaumann, M.D. [facility] [place]                             RADIATION ONCOLOGY END OF TREATMENT NOTE     Diagnosis: C50.111 Malignant neoplasm of central portion of right female breast Staging on 2023-08-24: Malignant neoplasm of central portion of right breast in female, estrogen receptor positive (HCC) T=cT1a, N=cN0, M=cM0 Intent: Curative     ==========DELIVERED PLANS==========  First Treatment Date: 2023-10-25 Last Treatment Date: 2023-11-22   Plan Name: Breast_R Site: Breast, Right Technique: 3D Mode: Photon Dose Per Fraction: 2.67 Gy Prescribed Dose (Delivered / Prescribed): 40.05 Gy / 40.05 Gy Prescribed Fxs (Delivered / Prescribed): 15 / 15   Plan Name: Breast_R_Bst Site: Breast, Right Technique: 3D Mode: Photon Dose Per Fraction: 2 Gy Prescribed Dose (Delivered / Prescribed): 10 Gy / 10 Gy Prescribed Fxs (Delivered / Prescribed): 5 / 5     ==========ON TREATMENT VISIT DATES========== 2023-10-25, 2023-11-01, 2023-11-15, 2023-11-22     ==========UPCOMING VISITS==========       ==========APPENDIX - ON TREATMENT VISIT NOTES==========   See weekly On Treatment Notes in Epic for details in the Media tab (listed as Progress notes on the On Treatment Visit Dates listed above).

## 2023-12-08 ENCOUNTER — Other Ambulatory Visit: Payer: Self-pay | Admitting: Family Medicine

## 2023-12-08 ENCOUNTER — Other Ambulatory Visit: Payer: Self-pay

## 2023-12-08 ENCOUNTER — Telehealth: Payer: Self-pay

## 2023-12-08 DIAGNOSIS — R350 Frequency of micturition: Secondary | ICD-10-CM

## 2023-12-08 MED ORDER — CYCLOBENZAPRINE HCL 10 MG PO TABS: 5.0000 mg | ORAL_TABLET | Freq: Three times a day (TID) | ORAL | 1 refills | Status: DC | PRN

## 2023-12-08 NOTE — Telephone Encounter (Signed)
 Copied from CRM [id number]. Topic: Clinical - Medication Refill >> Dec 08, 2023 10:38 AM Viola FALCON wrote: Most Recent Primary Care Visit:  Provider: [name] ARLYSS RAMAN  Department: [facility]  Visit Type: PHYSICAL  Date: 09/16/2023  Medication: ***  Has the patient contacted their pharmacy?  (Agent: If no, request that the patient contact the pharmacy for the refill. If patient does not wish to contact the pharmacy document the reason why and proceed with request.) (Agent: If yes, when and what did the pharmacy advise?)  Is this the correct pharmacy for this prescription?  If no, delete pharmacy and type the correct one.  This is the patient's preferred pharmacy:  [facility] [address] [place] - [address] [place] [address] Phone: [phone] Fax: [phone]   Has the prescription been filled recently?   Is the patient out of the medication?   Has the patient been seen for an appointment in the last year OR does the patient have an upcoming appointment?   Can we respond through MyChart?   Agent: Please be advised that Rx refills may take up to 3 business days. We ask that you follow-up with your pharmacy.

## 2023-12-08 NOTE — Telephone Encounter (Signed)
 Copied from CRM [id number]. Topic: Clinical - Medication Refill >> Dec 08, 2023 10:38 AM Viola FALCON wrote: Most Recent Primary Care Visit:  Provider: [name] ARLYSS RAMAN  Department: [facility]  Visit Type: PHYSICAL  Date: 09/16/2023  Medication: cyclobenzaprine    Has the patient contacted their pharmacy? Yes (Agent: If no, request that the patient contact the pharmacy for the refill. If patient does not wish to contact the pharmacy document the reason why and proceed with request.) (Agent: If yes, when and what did the pharmacy advise?)  Is this the correct pharmacy for this prescription? Yes If no, delete pharmacy and type the correct one.  This is the patient's preferred pharmacy:   [facility] [address], [place] - [address] [place] [address] Phone: [phone] Fax: [phone]   Has the prescription been filled recently? Yes  Is the patient out of the medication? No  Has the patient been seen for an appointment in the last year OR does the patient have an upcoming appointment? Yes  Can we respond through MyChart? Yes  Agent: Please be advised that Rx refills may take up to 3 business days. We ask that you follow-up with your pharmacy.

## 2023-12-08 NOTE — Telephone Encounter (Signed)
 Copied from CRM [id number]. Topic: Clinical - Medical Advice >> Dec 08, 2023 10:43 AM Sim Boast F wrote: Reason for CRM: Patient has been taking the Tolterodine for 2 months and says it's not working - would like to know what she should do moving forward?

## 2023-12-09 NOTE — Telephone Encounter (Signed)
I put in the referral.  Thanks.  

## 2023-12-09 NOTE — Telephone Encounter (Signed)
 She has tried mult meds w/o relief.  Is she okay with seeing urology?  Please let me know and I'll put in the referral.  Thanks.

## 2023-12-09 NOTE — Telephone Encounter (Signed)
 Called patient she would like to have referral sent to [facility]. She was informed to reach out if not received call to set up visit in 2 weeks.

## 2023-12-12 ENCOUNTER — Other Ambulatory Visit: Payer: Self-pay | Admitting: Medical Genetics

## 2023-12-13 ENCOUNTER — Inpatient Hospital Stay: Payer: Medicare Other | Attending: Hematology and Oncology | Admitting: Hematology and Oncology

## 2023-12-13 VITALS — BP 132/67 | HR 85 | Temp 98.0°F | Resp 17 | Wt 190.8 lb

## 2023-12-13 DIAGNOSIS — Z170 Estrogen receptor positive status [ER+]: Secondary | ICD-10-CM | POA: Diagnosis not present

## 2023-12-13 DIAGNOSIS — C50111 Malignant neoplasm of central portion of right female breast: Secondary | ICD-10-CM | POA: Diagnosis not present

## 2023-12-13 DIAGNOSIS — Z87891 Personal history of nicotine dependence: Secondary | ICD-10-CM | POA: Diagnosis not present

## 2023-12-13 DIAGNOSIS — Z800 Family history of malignant neoplasm of digestive organs: Secondary | ICD-10-CM | POA: Diagnosis not present

## 2023-12-13 DIAGNOSIS — Z8052 Family history of malignant neoplasm of bladder: Secondary | ICD-10-CM | POA: Diagnosis not present

## 2023-12-13 DIAGNOSIS — M8580 Other specified disorders of bone density and structure, unspecified site: Secondary | ICD-10-CM | POA: Insufficient documentation

## 2023-12-13 DIAGNOSIS — Z803 Family history of malignant neoplasm of breast: Secondary | ICD-10-CM | POA: Diagnosis not present

## 2023-12-13 MED ORDER — LETROZOLE 2.5 MG PO TABS: 2.5000 mg | ORAL_TABLET | Freq: Every day | ORAL | 3 refills | Status: DC

## 2023-12-13 NOTE — Assessment & Plan Note (Signed)
 Post-Radiation Therapy Reports fatigue and skin soreness, with occasional sharp nerve pains. Skin appears to be healing well. -Continue current management and monitor symptoms.  Breast cancer Discussed starting letrozole  to reduce estrogen production. Anticipated side effects include potential hot flashes and vaginal dryness, bone density loss. -Start letrozole , to be picked up from [facility]. -Plan for follow-up visit in 3-4 months to assess tolerance to medication.  Mild Osteopenia Recent bone density scan showed mild osteopenia. -Encouraged weightlifting exercises. -Start calcium and vitamin D supplementation. -Continue monitoring with bone density scans every two years.  Survivorship Visit Scheduled for a survivorship visit with nurse practitioner Manuelita. -Plan to combine this visit with the follow-up for letrozole  tolerance. -Follow-up in 6 months if all goes well with letrozole .

## 2023-12-13 NOTE — Progress Notes (Signed)
 [facility] CONSULT NOTE  Patient Care Team: [name] Arlyss RAMAN, MD as PCP - General Dasher, Lynwood, MD (Surgery) Kristie Lamprey, MD as Consulting Physician (Gastroenterology) Octavia, Charlie Hamilton, MD as Consulting Physician (Ophthalmology) Loretha Ash, MD as Consulting Physician (Hematology and Oncology) Vanderbilt Ned, MD as Consulting Physician (General Surgery) Shannon Lynwood, MD as Consulting Physician (Radiation Oncology) Glean Stephane BROCKS, RN as Oncology Nurse Navigator Tyree Nanetta SAILOR, RN as Oncology Nurse Navigator  CHIEF COMPLAINTS/PURPOSE OF CONSULTATION:  Newly diagnosed breast cancer  HISTORY OF PRESENTING ILLNESS:  Dana Harding 73 y.o. female is here because of recent diagnosis of right breast IDC  I reviewed her records extensively and collaborated the history with the patient.  SUMMARY OF ONCOLOGIC HISTORY: Oncology History  Malignant neoplasm of central portion of right breast in female, estrogen receptor positive (HCC)  08/10/2023 Mammogram   Mammogram showed architectural distortion in the right breast. Diagnostic mammo showed 0.4 cm central lucent architectural distortion in the right breast. Stereotactic biopsy is recommended.   08/18/2023 Pathology Results    1. Breast, right, needle core biopsy, mass retroareolar :       - INVASIVE DUCTAL CARCINOMA, SEE NOTE       - TUBULE FORMATION: SCORE 2       - NUCLEAR PLEOMORPHISM: SCORE 2       - MITOTIC COUNT: SCORE 1       - TOTAL SCORE: 5       - OVERALL GRADE: 1       - LYMPHOVASCULAR INVASION: NOT IDENTIFIED       - CANCER LENGTH: 0.5 CM       - CALCIFICATIONS: NOT IDENTIFIED   Results:  IMMUNOHISTOCHEMICAL AND MORPHOMETRIC ANALYSIS PERFORMED MANUALLY  The tumor cells are NEGATIVE for Her2 (1+).  Estrogen Receptor:  100%, POSITIVE, STRONG STAINING INTENSITY  Progesterone Receptor:  50%, POSITIVE, STRONG STAINING INTENSITY  Proliferation Marker Ki67:  5%    08/22/2023 Initial Diagnosis    Malignant neoplasm of central portion of right breast in female, estrogen receptor positive (HCC)   08/24/2023 Cancer Staging   Staging form: Breast, AJCC 8th Edition - Clinical stage from 08/24/2023: Stage IA (cT1a, cN0, cM0, G1, ER+, PR+, HER2-) - Signed by Loretha Ash, MD on 08/24/2023 Stage prefix: Initial diagnosis Histologic grading system: 3 grade system Laterality: Right Staged by: Pathologist and managing physician Stage used in treatment planning: Yes National guidelines used in treatment planning: Yes Type of national guideline used in treatment planning: NCCN    Genetic Testing   [facility] CancerNext-Expanded Panel+RNA was Negative. Report date is 09/01/2023.   The CancerNext-Expanded gene panel offered by [facility] and includes sequencing, rearrangement, and RNA analysis for the following 71 genes: AIP, ALK, APC, ATM, AXIN2, BAP1, BARD1, BMPR1A, BRCA1, BRCA2, BRIP1, CDC73, CDH1, CDK4, CDKN1B, CDKN2A, CHEK2, CTNNA1, DICER1, FH, FLCN, KIF1B, LZTR1, MAX, MEN1, MET, MLH1, MSH2, MSH3, MSH6, MUTYH, NF1, NF2, NTHL1, PALB2, PHOX2B, PMS2, POT1, PRKAR1A, PTCH1, PTEN, RAD51C, RAD51D, RB1, RET, SDHA, SDHAF2, SDHB, SDHC, SDHD, SMAD4, SMARCA4, SMARCB1, SMARCE1, STK11, SUFU, TMEM127, TP53, TSC1, TSC2, and VHL (sequencing and deletion/duplication); EGFR, EGLN1, HOXB13, KIT, MITF, PDGFRA, POLD1, and POLE (sequencing only); EPCAM and GREM1 (deletion/duplication only).     Discussed the use of AI scribe software for clinical note transcription with the patient, who gave verbal consent to proceed.  History of Present Illness    The patient, with a history of breast cancer, presents for a follow-up after completing radiation therapy. She reports fatigue towards  the end of the treatment and post-treatment, which is a common side effect of radiation. The patient also reports soreness and occasional sharp nerve pains in the area of radiation, but overall, she is managing the discomfort.  She had  experienced hot flashes during menopause, which she managed with hormone replacement therapy for a short period. The patient is aware that hot flashes could recur with antiestrogen therapy.  The patient also has mild osteopenia, noted on most recent bone density scan.  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   bilateral articular L4-5; per MRI   Family history of malignant neoplasm of breast    Fitting and adjustment of gastric lap band    GERD (gastroesophageal reflux disease)    IBS (irritable bowel syndrome)    Inflammatory arthritis    H/o elevated ANA, treated with plaquenil   Lumbar foraminal stenosis 02/09/2007   mild; L4-5; per MRI   Mitral regurgitation 03/23/2007   mild   Obesity    Obstructive sleep apnea (adult) (pediatric)    prev used CPAP, resolved and off CPAP as of 2016   Tricuspid regurgitation 03/23/2007   mild to moderate    SURGICAL HISTORY: Past Surgical History:  Procedure Laterality Date   BLEPHAROPLASTY     BREAST LUMPECTOMY WITH RADIOACTIVE SEED LOCALIZATION Right 09/14/2023   Procedure: RIGHT BREAST LUMPECTOMY WITH RADIOACTIVE SEED LOCALIZATION;  Surgeon: Vanderbilt Ned, MD;  Location: [facility] OR;  Service: General;  Laterality: Right;   BREAST REDUCTION SURGERY  2009   CATARACT EXTRACTION Left 01/01/2019   CATARACT EXTRACTION W/ INTRAOCULAR LENS IMPLANT Right 12/11/2018   CHOLECYSTECTOMY  1980's   COSMETIC SURGERY  2021   lower blepharoplasty   EYE SURGERY  2020   cataract surgery   INGUINAL HERNIA REPAIR Bilateral    x2   LAPAROSCOPIC GASTRIC BANDING  2007   removed   SHOULDER ARTHROSCOPY WITH ROTATOR CUFF REPAIR AND SUBACROMIAL DECOMPRESSION Left 02/22/2013   Procedure: LEFT SHOULDER ARTHROSCOPY WITH SUBACROMIAL  DECOMPRESSION DISTAL CLAVICLE RESECTION with ROTATOR CUFF REPAIR  ;  Surgeon: Franky CHRISTELLA Pointer, MD;  Location: [facility] OR;  Service: Orthopedics;  Laterality: Left;   TONSILLECTOMY AND ADENOIDECTOMY  1960   tummy tuck  1996   UMBILICAL HERNIA REPAIR     As a child    SOCIAL HISTORY: Social History   Socioeconomic History   Marital status: Single    Spouse name: Not on file   Number of children: 1   Years of education: Not on file   Highest education level: Professional school degree (e.g., MD, DDS, DVM, JD)  Occupational History   Not on file  Tobacco Use   Smoking status: Former    Current packs/day: 0.00    Types: Cigarettes    Quit date: 12/06/1978    Years since quitting: 45.0   Smokeless tobacco: Never  Vaping Use   Vaping status: Never Used  Substance and Sexual Activity   Alcohol use: Yes    Alcohol/week: 2.0 standard drinks of alcohol    Types: 1 Glasses of wine, 1 Standard drinks or equivalent per week    Comment: Socially   Drug use: No   Sexual activity: Not Currently    Birth control/protection: Post-menopausal  Other Topics Concern   Not on file  Social History Narrative   Single   1 daughter-family physician   Undergrad at [employer] and law degree at [employer]; 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   Social Drivers of Health   Financial Resource Strain: Low Risk  (09/15/2023)   Overall Financial Resource Strain (CARDIA)    Difficulty of Paying Living Expenses: Not hard at all  Food Insecurity: No Food Insecurity (10/12/2023)   Hunger Vital Sign    Worried About Running Out of Food in the Last Year: Never true    Ran Out of Food in the Last Year: Never true  Transportation Needs: No Transportation Needs (10/12/2023)   PRAPARE - Administrator, Civil Service (Medical): No    Lack of Transportation (Non-Medical): No  Physical Activity: Insufficiently Active (09/15/2023)   Exercise Vital Sign    Days of Exercise per Week: 2 days    Minutes of Exercise per  Session: 40 min  Stress: No Stress Concern Present (09/15/2023)   [employer] of Occupational Health - Occupational Stress Questionnaire    Feeling of Stress : Not at all  Social Connections: Socially Isolated (09/15/2023)   Social Connection and Isolation Panel [NHANES]    Frequency of Communication with Friends and Family: Once a week    Frequency of Social Gatherings with Friends and Family: Twice a week    Attends Religious Services: Never    Database Administrator or Organizations: No    Attends Banker Meetings: Never    Marital Status: Divorced  Catering Manager Violence: Not At Risk (10/12/2023)   Humiliation, Afraid, Rape, and Kick questionnaire    Fear of Current or Ex-Partner: No    Emotionally Abused: No    Physically Abused: No    Sexually Abused: No    FAMILY HISTORY: Family History  Problem Relation Age of Onset   Breast cancer Mother 28       bilateral   Cancer Mother    Stroke Father        < 60   Heart disease Father    Kidney disease Father    Bladder Cancer Sister 18   Cancer Sister 68       Neuroendocrine tumor found incidentally in the small intestine   Colon cancer Paternal Uncle     ALLERGIES:  is allergic to latex, aspirin, and prilosec [omeprazole ].  MEDICATIONS:  Current Outpatient Medications  Medication Sig Dispense Refill   letrozole  (FEMARA ) 2.5 MG tablet Take 1 tablet (2.5 mg total) by mouth daily. 90 tablet 3   ALPRAZolam  (XANAX ) 0.5 MG tablet TAKE ONE-HALF TO ONE TABLET BY MOUTH DAILY AS NEEDED FOR ANXIETY 30 tablet 0   Ascorbic Acid (VITAMIN C) 1000 MG tablet Take 1,000 mg by mouth daily.     azelastine  (ASTELIN ) 0.1 % nasal spray Place 1-2 sprays into both nostrils 2 (two) times daily as needed for rhinitis. 30 mL 12   B Complex Vitamins (VITAMIN B COMPLEX PO) Take 1 tablet by mouth every other day.     carboxymethylcellulose (REFRESH PLUS) 0.5 % SOLN Place 1 drop into both eyes in the morning and at bedtime.      Cholecalciferol (VITAMIN D-3) 125 MCG (5000 UT) TABS Take 2,000 Units by mouth every other day.     clidinium-chlordiazePOXIDE  (LIBRAX) 5-2.5 MG capsule Take 1 capsule by mouth 3 (three) times daily as needed (esophageal spasms). 60 capsule 1  cyclobenzaprine  (FLEXERIL ) 10 MG tablet Take 0.5-1 tablets (5-10 mg total) by mouth 3 (three) times daily as needed for muscle spasms. 60 tablet 1   Fluocinolone Acetonide Scalp 0.01 % OIL Apply 1 application topically daily as needed for irritation.     fluticasone (FLONASE) 50 MCG/ACT nasal spray Place 1-2 sprays into both nostrils daily as needed for allergies or rhinitis. (Patient not taking: Reported on 10/12/2023)     HYDROcodone -acetaminophen  (NORCO/VICODIN) 5-325 MG tablet Take 1 tablet by mouth 3 (three) times daily as needed for moderate pain (sedation caution). 15 tablet 0   L-LYSINE PO Take 1 capsule by mouth daily.     Multiple Vitamins-Minerals (PRESERVISION AREDS 2 PO) Take 1 capsule by mouth in the morning and at bedtime.     naproxen  sodium (ALEVE ) 220 MG tablet Take 220 mg by mouth daily as needed (pain). (Patient not taking: Reported on 10/12/2023)     olopatadine (PATADAY) 0.1 % ophthalmic solution Place 1 drop into both eyes 2 (two) times daily as needed for allergies.     Omega-3 Fatty Acids (OMEGA 3 PO) Take 1 capsule by mouth every other day. (Patient not taking: Reported on 10/12/2023)     tolterodine  (DETROL  LA) 2 MG 24 hr capsule Take 1 capsule (2 mg total) by mouth daily. (Patient not taking: Reported on 10/12/2023) 30 capsule 5   zinc gluconate 50 MG tablet Take 50 mg by mouth daily as needed (cold symptoms).     No current facility-administered medications for this visit.    REVIEW OF SYSTEMS:   Constitutional: Denies fevers, chills or abnormal night sweats Eyes: Denies blurriness of vision, double vision or watery eyes Ears, nose, mouth, throat, and face: Denies mucositis or sore throat Respiratory: Denies cough, dyspnea or  wheezes Cardiovascular: Denies palpitation, chest discomfort or lower extremity swelling Gastrointestinal:  Denies nausea, heartburn or change in bowel habits Skin: Denies abnormal skin rashes Lymphatics: Denies new lymphadenopathy or easy bruising Neurological:Denies numbness, tingling or new weaknesses Behavioral/Psych: Mood is stable, no new changes  Breast: Denies any palpable lumps or discharge All other systems were reviewed with the patient and are negative.  PHYSICAL EXAMINATION: ECOG PERFORMANCE STATUS: 0 - Asymptomatic  Vitals:   12/13/23 1021  BP: 132/67  Pulse: 85  Resp: 17  Temp: 98 F (36.7 C)  SpO2: 95%   Filed Weights   12/13/23 1021  Weight: 190 lb 12.8 oz (86.5 kg)    GENERAL:alert, no distress and comfortable  LABORATORY DATA:  I have reviewed the data as listed Lab Results  Component Value Date   WBC 6.7 08/24/2023   HGB 12.2 08/24/2023   HCT 38.2 08/24/2023   MCV 85.7 08/24/2023   PLT 277 08/24/2023   Lab Results  Component Value Date   NA 140 08/24/2023   K 4.4 08/24/2023   CL 105 08/24/2023   CO2 30 08/24/2023    RADIOGRAPHIC STUDIES: I have personally reviewed the radiological reports and agreed with the findings in the report.  ASSESSMENT AND PLAN:  Malignant neoplasm of central portion of right breast in female, estrogen receptor positive (HCC) Post-Radiation Therapy Reports fatigue and skin soreness, with occasional sharp nerve pains. Skin appears to be healing well. -Continue current management and monitor symptoms.  Breast cancer Discussed starting letrozole  to reduce estrogen production. Anticipated side effects include potential hot flashes and vaginal dryness, bone density loss. -Start letrozole , to be picked up from [facility]. -Plan for follow-up visit in 3-4 months to assess tolerance to  medication.  Mild Osteopenia Recent bone density scan showed mild osteopenia. -Encouraged weightlifting exercises. -Start calcium and  vitamin D supplementation. -Continue monitoring with bone density scans every two years.  Survivorship Visit Scheduled for a survivorship visit with nurse practitioner Manuelita. -Plan to combine this visit with the follow-up for letrozole  tolerance. -Follow-up in 6 months if all goes well with letrozole .  All questions were answered. The patient knows to call the clinic with any problems, questions or concerns.    Amber Stalls, MD 12/13/23

## 2023-12-14 ENCOUNTER — Telehealth: Payer: Self-pay | Admitting: Family Medicine

## 2023-12-14 NOTE — Telephone Encounter (Signed)
 Copied from CRM [id number]. Topic: Clinical - Prescription Issue >> Dec 14, 2023  1:02 PM Macario HERO wrote: Reason for CRM: Patient stated she called [facility] pharmacy regarding her prescription cyclobenzaprine  and they said they were waiting for us  to send it over. I adv a refill request was submitted already.

## 2023-12-14 NOTE — Telephone Encounter (Signed)
 Spoke with Katie at [facility] and they do have the rx for Flexeril and it should be ready in the next hour. Reached out to patient and left voicemail per DPR advising patient of information.

## 2023-12-19 DIAGNOSIS — C50111 Malignant neoplasm of central portion of right female breast: Secondary | ICD-10-CM | POA: Diagnosis not present

## 2023-12-19 DIAGNOSIS — Z170 Estrogen receptor positive status [ER+]: Secondary | ICD-10-CM | POA: Diagnosis not present

## 2023-12-27 ENCOUNTER — Encounter: Payer: Self-pay | Admitting: Radiation Oncology

## 2023-12-28 ENCOUNTER — Encounter: Payer: Self-pay | Admitting: *Deleted

## 2023-12-28 NOTE — Progress Notes (Signed)
Radiation Oncology         [phone] ________________________________  Name: Dana Harding MRN: [id number]  Date: 12/29/2023  DOB: 04-Oct-1951  Follow-Up Visit Note  CC: Joaquim Nam, MD  Joaquim Nam, MD  No diagnosis found.  Diagnosis:   C50.111 Malignant neoplasm of central portion of right female breast   Staging on 2023-08-24: Malignant neoplasm of central portion of right breast in female, estrogen receptor positive (HCC) T=cT1a, N=cN0, M=cM0    Indication for treatment:  Curative  Interval Since Last Radiation:  1 month 5 days   Radiation treatment dates:   First Treatment Date: 2023-10-25 Last Treatment Date: 2023-11-22   Site/Dose/Technique/Mode:    Plan Name: Breast_R Site: Breast, Right Technique: 3D Mode: Photon Dose Per Fraction: 2.67 Gy Prescribed Dose (Delivered / Prescribed): 40.05 Gy / 40.05 Gy Prescribed Fxs (Delivered / Prescribed): 15 / 15   Plan Name: Breast_R_Bst Site: Breast, Right Technique: 3D Mode: Photon Dose Per Fraction: 2 Gy Prescribed Dose (Delivered / Prescribed): 10 Gy / 10 Gy Prescribed Fxs (Delivered / Prescribed): 5 / 5  Narrative:  The patient returns today for routine follow-up. She was last seen in office on 10-12-23 for her initial consult. Since then, she completed radiation treatment which she tolerates it well except feeling fatigued and experiencing skin itchiness. She presented for a follow up with Dr. Rachel Moulds on 12-13-23, where she endorsed fatigue, sourness and occasional sharp nerve pains in the treatment field. On 12-19-23, she present for a long-term post OP follow-up with Dr. Luisa Hart. At that time, she continued to complain of fatigue along with soreness and sharp pain of treatment field.    No other significant oncologic interval history since the patient was last seen.                              Of note: Recent DEXA scan done on 11-15-23 revealed mild osteopenia.   Allergies:  is allergic to latex,  aspirin, and prilosec [omeprazole].  Meds: Current Outpatient Medications  Medication Sig Dispense Refill   ALPRAZolam (XANAX) 0.5 MG tablet TAKE ONE-HALF TO ONE TABLET BY MOUTH DAILY AS NEEDED FOR ANXIETY 30 tablet 0   Ascorbic Acid (VITAMIN C) 1000 MG tablet Take 1,000 mg by mouth daily.     azelastine (ASTELIN) 0.1 % nasal spray Place 1-2 sprays into both nostrils 2 (two) times daily as needed for rhinitis. 30 mL 12   B Complex Vitamins (VITAMIN B COMPLEX PO) Take 1 tablet by mouth every other day.     carboxymethylcellulose (REFRESH PLUS) 0.5 % SOLN Place 1 drop into both eyes in the morning and at bedtime.     Cholecalciferol (VITAMIN D-3) 125 MCG (5000 UT) TABS Take 2,000 Units by mouth every other day.     clidinium-chlordiazePOXIDE (LIBRAX) 5-2.5 MG capsule Take 1 capsule by mouth 3 (three) times daily as needed (esophageal spasms). 60 capsule 1   cyclobenzaprine (FLEXERIL) 10 MG tablet Take 0.5-1 tablets (5-10 mg total) by mouth 3 (three) times daily as needed for muscle spasms. 60 tablet 1   Fluocinolone Acetonide Scalp 0.01 % OIL Apply 1 application topically daily as needed for irritation.     fluticasone (FLONASE) 50 MCG/ACT nasal spray Place 1-2 sprays into both nostrils daily as needed for allergies or rhinitis. (Patient not taking: Reported on 10/12/2023)     HYDROcodone-acetaminophen (NORCO/VICODIN) 5-325 MG tablet Take 1 tablet by mouth 3 (three) times  daily as needed for moderate pain (sedation caution). 15 tablet 0   L-LYSINE PO Take 1 capsule by mouth daily.     letrozole (FEMARA) 2.5 MG tablet Take 1 tablet (2.5 mg total) by mouth daily. 90 tablet 3   Multiple Vitamins-Minerals (PRESERVISION AREDS 2 PO) Take 1 capsule by mouth in the morning and at bedtime.     naproxen sodium (ALEVE) 220 MG tablet Take 220 mg by mouth daily as needed (pain). (Patient not taking: Reported on 10/12/2023)     olopatadine (PATADAY) 0.1 % ophthalmic solution Place 1 drop into both eyes 2 (two)  times daily as needed for allergies.     Omega-3 Fatty Acids (OMEGA 3 PO) Take 1 capsule by mouth every other day. (Patient not taking: Reported on 10/12/2023)     tolterodine (DETROL LA) 2 MG 24 hr capsule Take 1 capsule (2 mg total) by mouth daily. (Patient not taking: Reported on 10/12/2023) 30 capsule 5   zinc gluconate 50 MG tablet Take 50 mg by mouth daily as needed (cold symptoms).     No current facility-administered medications for this encounter.    Physical Findings: The patient is in no acute distress. Patient is alert and oriented.  vitals were not taken for this visit. .  No significant changes. Lungs are clear to auscultation bilaterally. Heart has regular rate and rhythm. No palpable cervical, supraclavicular, or axillary adenopathy. Abdomen soft, non-tender, normal bowel sounds.   Lab Findings: Lab Results  Component Value Date   WBC 6.7 08/24/2023   HGB 12.2 08/24/2023   HCT 38.2 08/24/2023   MCV 85.7 08/24/2023   PLT 277 08/24/2023    Radiographic Findings: No results found.  Impression: C50.111 Malignant neoplasm of central portion of right female breast   Staging on 2023-08-24: Malignant neoplasm of central portion of right breast in female, estrogen receptor positive (HCC) T=cT1a, N=cN0, M=cM0     The patient is recovering from the effects of radiation.  ***  Plan:  ***   *** minutes of total time was spent for this patient encounter, including preparation, face-to-face counseling with the patient and coordination of care, physical exam, and documentation of the encounter. ____________________________________  Billie Lade, PhD, MD  This document serves as a record of services personally performed by Antony Blackbird, MD. It was created on his behalf by Neena Rhymes, a trained medical scribe. The creation of this record is based on the scribe's personal observations and the provider's statements to them. This document has been checked and approved by the  attending provider.

## 2023-12-28 NOTE — Progress Notes (Signed)
  Radiation Oncology         [phone] ________________________________  Name: Aubre Marts MRN: [id number]  Date: 12/29/2023  DOB: 05/04/1951  End of Treatment Note  Diagnosis:   C50.111 Malignant neoplasm of central portion of right female breast   Staging on 2023-08-24: Malignant neoplasm of central portion of right breast in female, estrogen receptor positive (HCC) T=cT1a, N=cN0, M=cM0   Indication for treatment:  Curative        Radiation treatment dates:   First Treatment Date: 2023-10-25 Last Treatment Date: 2023-11-22  Site/Dose/Technique/Mode:   Plan Name: Breast_R Site: Breast, Right Technique: 3D Mode: Photon Dose Per Fraction: 2.67 Gy Prescribed Dose (Delivered / Prescribed): 40.05 Gy / 40.05 Gy Prescribed Fxs (Delivered / Prescribed): 15 / 15   Plan Name: Breast_R_Bst Site: Breast, Right Technique: 3D Mode: Photon Dose Per Fraction: 2 Gy Prescribed Dose (Delivered / Prescribed): 10 Gy / 10 Gy Prescribed Fxs (Delivered / Prescribed): 5 / 5   Narrative: The patient tolerated radiation treatment relatively well. Patient endoreses feeling fatigued and experiencing itchiness of treatment field. Physical exam showed hyperpigmentation but no skin breakdown.   Plan: The patient has completed radiation treatment. The patient will return to radiation oncology clinic for routine followup in one month. I advised them to call or return sooner if they have any questions or concerns related to their recovery or treatment.  -----------------------------------  Billie Lade, PhD, MD  This document serves as a record of services personally performed by Antony Blackbird, MD. It was created on his behalf by Herbie Saxon, a trained medical scribe. The creation of this record is based on the scribe's personal observations and the provider's statements to them. This document has been checked and approved by the attending provider.

## 2023-12-29 ENCOUNTER — Encounter: Payer: Self-pay | Admitting: Radiation Oncology

## 2023-12-29 ENCOUNTER — Ambulatory Visit
Admission: RE | Admit: 2023-12-29 | Discharge: 2023-12-29 | Disposition: A | Discharge status: Home / Self Care | Payer: Medicare Other | Source: Ambulatory Visit | Attending: Radiation Oncology | Admitting: Radiation Oncology

## 2023-12-29 VITALS — BP 144/75 | HR 80 | Temp 97.3°F | Resp 18 | Ht 67.0 in | Wt 191.0 lb

## 2023-12-29 DIAGNOSIS — Z923 Personal history of irradiation: Secondary | ICD-10-CM | POA: Insufficient documentation

## 2023-12-29 DIAGNOSIS — C50111 Malignant neoplasm of central portion of right female breast: Secondary | ICD-10-CM | POA: Insufficient documentation

## 2023-12-29 DIAGNOSIS — Z170 Estrogen receptor positive status [ER+]: Secondary | ICD-10-CM | POA: Diagnosis not present

## 2023-12-29 HISTORY — DX: Personal history of irradiation: Z92.3

## 2023-12-29 NOTE — Progress Notes (Signed)
Dana Harding is here today for follow up post radiation to the breast.   Breast Side:Right   They completed their radiation on: 11/22/23   Does the patient complain of any of the following: Post radiation skin issues: Skin has started to peel.  Breast Tenderness: Yes reports aching to breast. Rates at 3/10.  Breast Swelling: No Lymphadema: No Range of Motion limitations: Reports mild tightness to right axilla when lifting.  Fatigue post radiation: Yes Appetite good/fair/poor: Good  Additional comments if applicable:  Patient started taking letrozole three weeks ago.   BP (!) 144/75 (BP Location: Left Arm, Patient Position: Sitting, Cuff Size: Normal)   Pulse 80   Temp (!) 97.3 F (36.3 C)   Resp 18   Ht 5\' 7"  (1.702 m)   Wt 191 lb (86.6 kg)   SpO2 100%   BMI 29.91 kg/m

## 2024-01-03 ENCOUNTER — Other Ambulatory Visit (HOSPITAL_COMMUNITY)
Admission: RE | Admit: 2024-01-03 | Discharge: 2024-01-03 | Disposition: A | Discharge status: Home / Self Care | Payer: Self-pay | Source: Ambulatory Visit | Attending: Medical Genetics | Admitting: Medical Genetics

## 2024-01-05 DIAGNOSIS — R350 Frequency of micturition: Secondary | ICD-10-CM | POA: Diagnosis not present

## 2024-01-05 DIAGNOSIS — R3915 Urgency of urination: Secondary | ICD-10-CM | POA: Diagnosis not present

## 2024-01-06 ENCOUNTER — Other Ambulatory Visit (HOSPITAL_COMMUNITY): Payer: Self-pay

## 2024-01-06 ENCOUNTER — Telehealth: Payer: Self-pay | Admitting: Pharmacy Technician

## 2024-01-06 ENCOUNTER — Other Ambulatory Visit: Payer: Self-pay

## 2024-01-06 NOTE — Telephone Encounter (Signed)
Pharmacy Patient Advocate Encounter   Received notification from CoverMyMeds that prior authorization for Cyclobenzaprine HCl 10MG  tablets is required/requested.   Insurance verification completed.   The patient is insured through [employer] .   Per test claim: PA required; PA submitted to above mentioned insurance via CoverMyMeds Key/confirmation #/EOC [id number] Status is pending

## 2024-01-06 NOTE — Telephone Encounter (Addendum)
Pharmacy Patient Advocate Encounter  Received notification from [facility] that Prior Authorization for Cyclobenzaprine HCl 10MG  tablets  has been APPROVED from 01/06/24 to 12/05/98. Ran test claim, Copay is $1.00. This test claim was processed through [facility]- copay amounts may vary at other pharmacies due to pharmacy/plan contracts, or as the patient moves through the different stages of their insurance plan.   PA #/Case ID/Reference #: [id number]

## 2024-01-15 LAB — GENECONNECT MOLECULAR SCREEN: Genetic Analysis Overall Interpretation: NEGATIVE

## 2024-03-01 DIAGNOSIS — M25551 Pain in right hip: Secondary | ICD-10-CM | POA: Diagnosis not present

## 2024-03-01 DIAGNOSIS — M25552 Pain in left hip: Secondary | ICD-10-CM | POA: Diagnosis not present

## 2024-03-06 ENCOUNTER — Telehealth: Payer: Self-pay | Admitting: *Deleted

## 2024-03-06 NOTE — Telephone Encounter (Signed)
 Called pt to reschedule SCP visit to 4/9 at 0840. Pt made aware and verbalized understanding

## 2024-03-07 ENCOUNTER — Inpatient Hospital Stay: Payer: BLUE CROSS/BLUE SHIELD | Attending: Hematology and Oncology | Admitting: Adult Health

## 2024-03-08 ENCOUNTER — Other Ambulatory Visit: Payer: Self-pay | Admitting: Family Medicine

## 2024-03-08 NOTE — Addendum Note (Signed)
 Addended by: Wendie Agreste on: 03/08/2024 04:25 PM   Modules accepted: Orders

## 2024-03-08 NOTE — Telephone Encounter (Signed)
 Copied from CRM [id number]. Topic: Clinical - Medication Refill >> Mar 08, 2024  4:16 PM Saverio Danker wrote: Most Recent Primary Care Visit:  Provider: Joaquim Nam  Department: [facility]  Visit Type: PHYSICAL  Date: 09/16/2023  Medication: cyclobenzaprine (FLEXERIL) 10 MG tablet ( patient would like 15mg )  Has the patient contacted their pharmacy? Yes (Agent: If no, request that the patient contact the pharmacy for the refill. If patient does not wish to contact the pharmacy document the reason why and proceed with request.) (Agent: If yes, when and what did the pharmacy advise?)  Is this the correct pharmacy for this prescription? Yes If no, delete pharmacy and type the correct one.  This is the patient's preferred pharmacy:  [facility] [address], [place] - [address] Gwynn Burly [place] [address] Phone: [phone] Fax: [phone]   Has the prescription been filled recently? No  Is the patient out of the medication? Yes  Has the patient been seen for an appointment in the last year OR does the patient have an upcoming appointment? Yes  Can we respond through MyChart? Yes  Agent: Please be advised that Rx refills may take up to 3 business days. We ask that you follow-up with your pharmacy.

## 2024-03-08 NOTE — Telephone Encounter (Signed)
 Pt would like to increase dose from 10 to 15 mg

## 2024-03-08 NOTE — Telephone Encounter (Signed)
 Flexeril has 1 refill at this time.

## 2024-03-09 ENCOUNTER — Telehealth: Payer: Self-pay | Admitting: Adult Health

## 2024-03-09 NOTE — Telephone Encounter (Signed)
 Spoke with pt about rescheduled appt time an date

## 2024-03-09 NOTE — Telephone Encounter (Signed)
 Please get update from patient re: 15mg  dose.  It it usually taken as max 10mg  per dose.  Please let me know about her situation.  And please route back to me so I can work on the rx.  Thanks.

## 2024-03-12 NOTE — Telephone Encounter (Signed)
 Spoke with patient and she is taking the medication as prn and ts very rare that it 3 times. Its truly as needed and she just like to keep some on hand

## 2024-03-13 ENCOUNTER — Telehealth: Payer: Self-pay

## 2024-03-13 MED ORDER — CYCLOBENZAPRINE HCL 10 MG PO TABS: 5.0000 mg | ORAL_TABLET | Freq: Three times a day (TID) | ORAL | 1 refills | Status: AC | PRN

## 2024-03-13 NOTE — Addendum Note (Signed)
 Addended by: Joaquim Nam on: 03/13/2024 06:38 AM   Modules accepted: Orders

## 2024-03-13 NOTE — Telephone Encounter (Signed)
 Copied from CRM [id number]. Topic: Clinical - Medication Question >> Mar 13, 2024 11:19 AM Elle L wrote: Reason for CRM: The patient states Dr. Para March prescribed her Myrbetriq (mirabegron) previously but it was over $600 and she is requesting to see if a generic could be called in for her. She is requesting Oxybutynin 15 MG if that prescription does not have a generic. She would like it sent to [facility] and I have added this Pharmacy to her chart. All other prescriptions will continue going to [facility]. The patient's call back [phone] if needed.

## 2024-03-13 NOTE — Telephone Encounter (Signed)
 Noted.  Sent.

## 2024-03-14 ENCOUNTER — Encounter: Admitting: Adult Health

## 2024-03-14 MED ORDER — OXYBUTYNIN CHLORIDE ER 15 MG PO TB24: 15.0000 mg | ORAL_TABLET | Freq: Every day | ORAL | 1 refills | Status: DC

## 2024-03-14 NOTE — Telephone Encounter (Signed)
 Patient notified

## 2024-03-14 NOTE — Telephone Encounter (Signed)
 I sent the oxybutynin rx.  Let me know if that isn't helpful or tolerated.  Thanks .

## 2024-03-14 NOTE — Addendum Note (Signed)
 Addended by: Joaquim Nam on: 03/14/2024 02:28 PM   Modules accepted: Orders

## 2024-03-15 ENCOUNTER — Ambulatory Visit: Payer: Medicare Other | Admitting: Physical Therapy

## 2024-03-19 ENCOUNTER — Inpatient Hospital Stay: Attending: Hematology and Oncology | Admitting: Adult Health

## 2024-03-19 ENCOUNTER — Encounter: Payer: Self-pay | Admitting: Adult Health

## 2024-03-19 VITALS — BP 130/68 | HR 86 | Temp 97.8°F | Resp 18 | Ht 67.0 in | Wt 191.0 lb

## 2024-03-19 DIAGNOSIS — Z1732 Human epidermal growth factor receptor 2 negative status: Secondary | ICD-10-CM | POA: Diagnosis not present

## 2024-03-19 DIAGNOSIS — C50111 Malignant neoplasm of central portion of right female breast: Secondary | ICD-10-CM | POA: Insufficient documentation

## 2024-03-19 DIAGNOSIS — Z170 Estrogen receptor positive status [ER+]: Secondary | ICD-10-CM | POA: Diagnosis not present

## 2024-03-19 DIAGNOSIS — Z79811 Long term (current) use of aromatase inhibitors: Secondary | ICD-10-CM | POA: Diagnosis not present

## 2024-03-19 NOTE — Progress Notes (Signed)
 Faxed orders for mammogram to [facility] at [phone], received fax confirmation.

## 2024-03-19 NOTE — Progress Notes (Signed)
 SURVIVORSHIP VISIT:  BRIEF ONCOLOGIC HISTORY:  Oncology History  Malignant neoplasm of central portion of right breast in female, estrogen receptor positive (HCC)  08/10/2023 Mammogram   Mammogram showed architectural distortion in the right breast. Diagnostic mammo showed 0.4 cm central lucent architectural distortion in the right breast. Stereotactic biopsy is recommended.   08/18/2023 Pathology Results    1. Breast, right, needle core biopsy, mass retroareolar :       - INVASIVE DUCTAL CARCINOMA, SEE NOTE       - TUBULE FORMATION: SCORE 2       - NUCLEAR PLEOMORPHISM: SCORE 2       - MITOTIC COUNT: SCORE 1       - TOTAL SCORE: 5       - OVERALL GRADE: 1       - LYMPHOVASCULAR INVASION: NOT IDENTIFIED       - CANCER LENGTH: 0.5 CM       - CALCIFICATIONS: NOT IDENTIFIED   Results:  IMMUNOHISTOCHEMICAL AND MORPHOMETRIC ANALYSIS PERFORMED MANUALLY  The tumor cells are NEGATIVE for Her2 (1+).  Estrogen Receptor:  100%, POSITIVE, STRONG STAINING INTENSITY  Progesterone Receptor:  50%, POSITIVE, STRONG STAINING INTENSITY  Proliferation Marker Ki67:  5%    08/22/2023 Initial Diagnosis   Malignant neoplasm of central portion of right breast in female, estrogen receptor positive (HCC)   08/24/2023 Cancer Staging   Staging form: Breast, AJCC 8th Edition - Clinical stage from 08/24/2023: Stage IA (cT1a, cN0, cM0, G1, ER+, PR+, HER2-) - Signed by Murleen Arms, MD on 08/24/2023 Stage prefix: Initial diagnosis Histologic grading system: 3 grade system Laterality: Right Staged by: Pathologist and managing physician Stage used in treatment planning: Yes National guidelines used in treatment planning: Yes Type of national guideline used in treatment planning: NCCN    Genetic Testing   [facility] CancerNext-Expanded Panel+RNA was Negative. Report date is 09/01/2023.   The CancerNext-Expanded gene panel offered by [facility] and includes sequencing, rearrangement, and RNA analysis for the  following 71 genes: AIP, ALK, APC, ATM, AXIN2, BAP1, BARD1, BMPR1A, BRCA1, BRCA2, BRIP1, CDC73, CDH1, CDK4, CDKN1B, CDKN2A, CHEK2, CTNNA1, DICER1, FH, FLCN, KIF1B, LZTR1, MAX, MEN1, MET, MLH1, MSH2, MSH3, MSH6, MUTYH, NF1, NF2, NTHL1, PALB2, PHOX2B, PMS2, POT1, PRKAR1A, PTCH1, PTEN, RAD51C, RAD51D, RB1, RET, SDHA, SDHAF2, SDHB, SDHC, SDHD, SMAD4, SMARCA4, SMARCB1, SMARCE1, STK11, SUFU, TMEM127, TP53, TSC1, TSC2, and VHL (sequencing and deletion/duplication); EGFR, EGLN1, HOXB13, KIT, MITF, PDGFRA, POLD1, and POLE (sequencing only); EPCAM and GREM1 (deletion/duplication only).    10/25/2023 - 11/22/2023 Radiation Therapy   Plan Name: Breast_R Site: Breast, Right Technique: 3D Mode: Photon Dose Per Fraction: 2.67 Gy Prescribed Dose (Delivered / Prescribed): 40.05 Gy / 40.05 Gy Prescribed Fxs (Delivered / Prescribed): 15 / 15   Plan Name: Breast_R_Bst Site: Breast, Right Technique: 3D Mode: Photon Dose Per Fraction: 2 Gy Prescribed Dose (Delivered / Prescribed): 10 Gy / 10 Gy Prescribed Fxs (Delivered / Prescribed): 5 / 5   12/2023 -  Anti-estrogen oral therapy   Letrozole     INTERVAL HISTORY:  Ms. Nerio to review her survivorship care plan detailing her treatment course for breast cancer, as well as monitoring long-term side effects of that treatment, education regarding health maintenance, screening, and overall wellness and health promotion.     Overall, Ms. Goodgame reports feeling quite well.  This issue for her has been weight gain on the letrozole.  Otherwise she is tolerating it well.  She wants to know if we can prescribe any  appetite suppressants because she feels very hungry and she is gaining weight and is concerned about the weight gain.  Her goal is to keep her weight down.  REVIEW OF SYSTEMS:  Review of Systems  Constitutional:  Positive for unexpected weight change. Negative for appetite change, chills, fatigue and fever.  HENT:   Negative for hearing loss, lump/mass and  trouble swallowing.   Eyes:  Negative for eye problems and icterus.  Respiratory:  Negative for chest tightness, cough and shortness of breath.   Cardiovascular:  Negative for chest pain, leg swelling and palpitations.  Gastrointestinal:  Negative for abdominal distention, abdominal pain, constipation, diarrhea, nausea and vomiting.  Endocrine: Negative for hot flashes.  Genitourinary:  Negative for difficulty urinating.   Musculoskeletal:  Negative for arthralgias.  Skin:  Negative for itching and rash.  Neurological:  Negative for dizziness, extremity weakness, headaches and numbness.  Hematological:  Negative for adenopathy. Does not bruise/bleed easily.  Psychiatric/Behavioral:  Negative for depression. The patient is not nervous/anxious.   Breast: Denies any new nodularity, masses, tenderness, nipple changes, or nipple discharge.       PAST MEDICAL/SURGICAL HISTORY:  Past Medical History:  Diagnosis Date   Anxiety    Cancer (HCC) 2024   Right Breast Cancer   Cataract 2020   correcting with surgery   Dizziness and giddiness    intermittent   Esophageal spasm    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   bilateral articular L4-5; per MRI   Family history of malignant neoplasm of breast    Fitting and adjustment of gastric lap band    GERD (gastroesophageal reflux disease)    History of radiation therapy    Right breast- 10/25/23-11/22/23- Dr. Antony Blackbird   IBS (irritable bowel syndrome)    Inflammatory arthritis    H/o elevated ANA, treated with plaquenil   Lumbar foraminal stenosis 02/09/2007   mild; L4-5; per MRI   Mitral regurgitation 03/23/2007   mild   Obesity    Obstructive sleep apnea (adult) (pediatric)    prev used CPAP, resolved and off CPAP as of 2016   Tricuspid regurgitation 03/23/2007   mild to moderate   Past Surgical History:  Procedure Laterality Date   BLEPHAROPLASTY     BREAST LUMPECTOMY WITH RADIOACTIVE SEED LOCALIZATION Right 09/14/2023    Procedure: RIGHT BREAST LUMPECTOMY WITH RADIOACTIVE SEED LOCALIZATION;  Surgeon: Harriette Bouillon, MD;  Location: [facility] OR;  Service: General;  Laterality: Right;   BREAST REDUCTION SURGERY  2009   CATARACT EXTRACTION Left 01/01/2019   CATARACT EXTRACTION W/ INTRAOCULAR LENS IMPLANT Right 12/11/2018   CHOLECYSTECTOMY  1980's   COSMETIC SURGERY  2021   lower blepharoplasty   EYE SURGERY  2020   cataract surgery   INGUINAL HERNIA REPAIR Bilateral    x2   LAPAROSCOPIC GASTRIC BANDING  2007   removed   SHOULDER ARTHROSCOPY WITH ROTATOR CUFF REPAIR AND SUBACROMIAL DECOMPRESSION Left 02/22/2013   Procedure: LEFT SHOULDER ARTHROSCOPY WITH SUBACROMIAL DECOMPRESSION DISTAL CLAVICLE RESECTION with ROTATOR CUFF REPAIR  ;  Surgeon: Senaida Lange, MD;  Location: [facility] OR;  Service: Orthopedics;  Laterality: Left;   TONSILLECTOMY AND ADENOIDECTOMY  1960   tummy tuck  1996   UMBILICAL HERNIA REPAIR     As a child     ALLERGIES:  Allergies  Allergen Reactions   Latex Itching and Rash   Aspirin Other (See Comments)    Stomach pain   Prilosec [Omeprazole] Nausea Only     CURRENT MEDICATIONS:  Outpatient Encounter Medications as of 03/19/2024  Medication Sig   ALPRAZolam (XANAX) 0.5 MG tablet TAKE ONE-HALF TO ONE TABLET BY MOUTH DAILY AS NEEDED FOR ANXIETY   Ascorbic Acid (VITAMIN C) 1000 MG tablet Take 1,000 mg by mouth daily.   azelastine (ASTELIN) 0.1 % nasal spray Place 1-2 sprays into both nostrils 2 (two) times daily as needed for rhinitis.   B Complex Vitamins (VITAMIN B COMPLEX PO) Take 1 tablet by mouth every other day.   carboxymethylcellulose (REFRESH PLUS) 0.5 % SOLN Place 1 drop into both eyes in the morning and at bedtime.   Cholecalciferol (VITAMIN D-3) 125 MCG (5000 UT) TABS Take 2,000 Units by mouth every other day.   clidinium-chlordiazePOXIDE (LIBRAX) 5-2.5 MG capsule Take 1 capsule by mouth 3 (three) times daily as needed (esophageal spasms).   cyclobenzaprine (FLEXERIL) 10 MG  tablet Take 0.5-1 tablets (5-10 mg total) by mouth 3 (three) times daily as needed for muscle spasms.   Fluocinolone Acetonide Scalp 0.01 % OIL Apply 1 application topically daily as needed for irritation.   fluticasone (FLONASE) 50 MCG/ACT nasal spray Place 1-2 sprays into both nostrils daily as needed for allergies or rhinitis. (Patient not taking: Reported on 10/12/2023)   HYDROcodone-acetaminophen (NORCO/VICODIN) 5-325 MG tablet Take 1 tablet by mouth 3 (three) times daily as needed for moderate pain (sedation caution).   letrozole (FEMARA) 2.5 MG tablet Take 1 tablet (2.5 mg total) by mouth daily.   Multiple Vitamins-Minerals (PRESERVISION AREDS 2 PO) Take 1 capsule by mouth in the morning and at bedtime.   naproxen sodium (ALEVE) 220 MG tablet Take 220 mg by mouth daily as needed (pain). (Patient not taking: Reported on 10/12/2023)   olopatadine (PATADAY) 0.1 % ophthalmic solution Place 1 drop into both eyes 2 (two) times daily as needed for allergies.   Omega-3 Fatty Acids (OMEGA 3 PO) Take 1 capsule by mouth every other day. (Patient not taking: Reported on 10/12/2023)   oxybutynin (DITROPAN XL) 15 MG 24 hr tablet Take 1 tablet (15 mg total) by mouth at bedtime.   zinc gluconate 50 MG tablet Take 50 mg by mouth daily as needed (cold symptoms).   No facility-administered encounter medications on file as of 03/19/2024.     ONCOLOGIC FAMILY HISTORY:  Family History  Problem Relation Age of Onset   Breast cancer Mother 8       bilateral   Cancer Mother    Stroke Father        < 60   Heart disease Father    Kidney disease Father    Bladder Cancer Sister 69   Cancer Sister 13       Neuroendocrine tumor found incidentally in the small intestine   Colon cancer Paternal Uncle      SOCIAL HISTORY:  Social History   Socioeconomic History   Marital status: Single    Spouse name: Not on file   Number of children: 1   Years of education: Not on file   Highest education level:  Professional school degree (e.g., MD, DDS, DVM, JD)  Occupational History   Not on file  Tobacco Use   Smoking status: Former    Current packs/day: 0.00    Types: Cigarettes    Quit date: 12/06/1978    Years since quitting: 45.3   Smokeless tobacco: Never  Vaping Use   Vaping status: Never Used  Substance and Sexual Activity   Alcohol use: Yes    Alcohol/week: 2.0 standard drinks of alcohol  Types: 1 Glasses of wine, 1 Standard drinks or equivalent per week    Comment: Socially   Drug use: No   Sexual activity: Not Currently    Birth control/protection: Post-menopausal  Other Topics Concern   Not on file  Social History Narrative   Single   1 daughter-family physician   Undergrad at [employer] and law degree at [employer]; 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   Social Drivers of Health   Financial Resource Strain: Low Risk  (09/15/2023)   Overall Financial Resource Strain (CARDIA)    Difficulty of Paying Living Expenses: Not hard at all  Food Insecurity: No Food Insecurity (10/12/2023)   Hunger Vital Sign    Worried About Running Out of Food in the Last Year: Never true    Ran Out of Food in the Last Year: Never true  Transportation Needs: No Transportation Needs (10/12/2023)   PRAPARE - Administrator, Civil Service (Medical): No    Lack of Transportation (Non-Medical): No  Physical Activity: Insufficiently Active (09/15/2023)   Exercise Vital Sign    Days of Exercise per Week: 2 days    Minutes of Exercise per Session: 40 min  Stress: No Stress Concern Present (09/15/2023)   [employer] of Occupational Health - Occupational Stress Questionnaire    Feeling of Stress : Not at all  Social Connections: Socially Isolated (09/15/2023)   Social Connection and Isolation Panel [NHANES]    Frequency of Communication with Friends and Family: Once a week    Frequency of Social Gatherings with Friends and Family: Twice a week    Attends Religious Services: Never     Database administrator or Organizations: No    Attends Banker Meetings: Never    Marital Status: Divorced  Catering manager Violence: Not At Risk (10/12/2023)   Humiliation, Afraid, Rape, and Kick questionnaire    Fear of Current or Ex-Partner: No    Emotionally Abused: No    Physically Abused: No    Sexually Abused: No     OBSERVATIONS/OBJECTIVE:  BP 130/68 (BP Location: Right Arm, Patient Position: Sitting)   Pulse 86   Temp 97.8 F (36.6 C) (Temporal)   Resp 18   Ht 5\' 7"  (1.702 m)   Wt 191 lb (86.6 kg)   SpO2 99%   BMI 29.91 kg/m  GENERAL: Patient is a well appearing female in no acute distress HEENT:  Sclerae anicteric.  Oropharynx clear and moist. No ulcerations or evidence of oropharyngeal candidiasis. Neck is supple.  NODES:  No cervical, supraclavicular, or axillary lymphadenopathy palpated.  BREAST EXAM: Right breast status postlumpectomy and radiation no sign of local recurrence left breast is benign. LUNGS:  Clear to auscultation bilaterally.  No wheezes or rhonchi. HEART:  Regular rate and rhythm. No murmur appreciated. ABDOMEN:  Soft, nontender.  Positive, normoactive bowel sounds. No organomegaly palpated. MSK:  No focal spinal tenderness to palpation. Full range of motion bilaterally in the upper extremities. EXTREMITIES:  No peripheral edema.   SKIN:  Clear with no obvious rashes or skin changes. No nail dyscrasia. NEURO:  Nonfocal. Well oriented.  Appropriate affect.   LABORATORY DATA:  None for this visit.  DIAGNOSTIC IMAGING:  None for this visit.      ASSESSMENT AND PLAN:  Ms.. Pugsley is a pleasant 73 y.o. female with Stage IA right breast invasive ductal carcinoma, ER+/PR+/HER2-, diagnosed in 08/2023, treated with lumpectomy, adjuvant radiation therapy, and anti-estrogen therapy with Letrozole  beginning in 12/2023.  She presents to the Survivorship Clinic for our initial meeting and routine follow-up post-completion of treatment for breast  cancer.    1. Stage IA right breast cancer:  Ms. Placido is continuing to recover from definitive treatment for breast cancer. She will follow-up with her medical oncologist, Dr.  Arno Bibles in 6 months with history and physical exam per surveillance protocol.  She will continue her anti-estrogen therapy with Letrozole. Thus far, she is tolerating the Letrozole well, with minimal side effects. Her mammogram is due 08/2024; orders placed today.   Today, a comprehensive survivorship care plan and treatment summary was reviewed with the patient today detailing her breast cancer diagnosis, treatment course, potential late/long-term effects of treatment, appropriate follow-up care with recommendations for the future, and patient education resources.  A copy of this summary, along with a letter will be sent to the patient's primary care provider via mail/fax/In Basket message after today's visit.    2.  Weight gain: I gave her handouts on whole food plant-based eating diet, exercise, sleep, stress management, avoiding risky substances, social connectedness, from the [employer] of lifestyle medicine.  I cannot prescribe an appetite suppressant but offered to connect her with providers who could help her with her weight issues.  She noted she will going to see her primary care to discuss this further.  3. Bone health:  Given Ms. Sigley's age/history of breast cancer and her current treatment regimen including anti-estrogen therapy with Letrozole, she is at risk for bone demineralization.  Her last DEXA scan was 11/2023, which showed osteopenia with a T-score -1.3 in the right femur.  Repeat is recommended to occur in December 2026. She was given education on specific activities to promote bone health.  4. Cancer screening:  Due to Ms. Lunney's history and her age, she should receive screening for skin cancers, colon cancer, and gynecologic cancers.  The information and recommendations are listed on the patient's  comprehensive care plan/treatment summary and were reviewed in detail with the patient.    5. Health maintenance and wellness promotion: Ms. Catino was encouraged to consume 5-7 servings of fruits and vegetables per day. We reviewed the "Nutrition Rainbow" handout.  She was also encouraged to engage in moderate to vigorous exercise for 30 minutes per day most days of the week.  She was instructed to limit her alcohol consumption and continue to abstain from tobacco use.     6. Support services/counseling: It is not uncommon for this period of the patient's cancer care trajectory to be one of many emotions and stressors.   She was given information regarding our available services and encouraged to contact me with any questions or for help enrolling in any of our support group/programs.    Follow up instructions:    -Return to cancer center in 6 months for follow-up with Dr. Arno Bibles -Mammogram due in September 2025 -Bone density testing in December 2026 -She is welcome to return back to the Survivorship Clinic at any time; no additional follow-up needed at this time.  -Consider referral back to survivorship as a long-term survivor for continued surveillance  The patient was provided an opportunity to ask questions and all were answered. The patient agreed with the plan and demonstrated an understanding of the instructions.   Total encounter time:30 minutes*in face-to-face visit time, chart review, lab review, care coordination, order entry, and documentation of the encounter time.    Alwin Baars, NP 03/19/24 9:16 AM Medical Oncology and Hematology [facility] [address] [place], [place] [address] Tel. [phone]    Fax. [phone]  *Total Encounter Time as defined by the Centers for Medicare and Medicaid Services includes, in addition to the face-to-face time of a patient visit (documented in the note above) non-face-to-face time: obtaining and reviewing outside  history, ordering and reviewing medications, tests or procedures, care coordination (communications with other health care professionals or caregivers) and documentation in the medical record.

## 2024-03-20 ENCOUNTER — Telehealth: Payer: Self-pay | Admitting: Hematology and Oncology

## 2024-03-20 ENCOUNTER — Other Ambulatory Visit: Payer: Self-pay

## 2024-03-20 NOTE — Telephone Encounter (Signed)
 Spoke with patient confirming upcoming appointment

## 2024-03-22 MED ORDER — OXYBUTYNIN CHLORIDE ER 15 MG PO TB24: 15.0000 mg | ORAL_TABLET | Freq: Every day | ORAL | 1 refills | Status: AC

## 2024-03-25 NOTE — Telephone Encounter (Signed)
 It was sent by Dr. Crissie Dome on 03/22/24 to [facility].   Thanks.

## 2024-03-29 DIAGNOSIS — M25551 Pain in right hip: Secondary | ICD-10-CM | POA: Diagnosis not present

## 2024-03-29 DIAGNOSIS — M1612 Unilateral primary osteoarthritis, left hip: Secondary | ICD-10-CM | POA: Diagnosis not present

## 2024-04-03 ENCOUNTER — Ambulatory Visit (INDEPENDENT_AMBULATORY_CARE_PROVIDER_SITE_OTHER): Admitting: Family Medicine

## 2024-04-03 ENCOUNTER — Encounter: Payer: Self-pay | Admitting: Family Medicine

## 2024-04-03 VITALS — BP 138/82 | HR 93 | Temp 98.8°F | Ht 67.0 in | Wt 192.6 lb

## 2024-04-03 DIAGNOSIS — Z6830 Body mass index (BMI) 30.0-30.9, adult: Secondary | ICD-10-CM

## 2024-04-03 NOTE — Patient Instructions (Signed)
 Let me see about options in the meantime re: weight loss meds.  Thank you for your effort.  Take care.  Glad to see you.

## 2024-04-03 NOTE — Progress Notes (Unsigned)
 Patient is having hip surgery in Sept. discussed preop weight loss goals, in order to help with surgery/recovery.  Discussed options. She is working out, goal not to lose muscle mass.  She is doing weight training.  Discussed routine cautions with medications like Wegovy, no history of pancreatitis.  History of abdominal surgeries noted.  Discussed potential GI side effects.  Noted history of weight gain since cancer treatment, d/w pt.    She had OSA at higher weight.    Meds, vitals, and allergies reviewed.   ROS: Per HPI unless specifically indicated in ROS section   Nad Ncat Neck supple, no LA Rrr Ctab Abd soft.  Nontender. Skin well-perfused.

## 2024-04-04 DIAGNOSIS — Z6830 Body mass index (BMI) 30.0-30.9, adult: Secondary | ICD-10-CM | POA: Insufficient documentation

## 2024-04-04 MED ORDER — WEGOVY 0.25 MG/0.5ML ~~LOC~~ SOAJ: SUBCUTANEOUS | 3 refills | Status: DC

## 2024-04-04 NOTE — Assessment & Plan Note (Signed)
 Discussed options.  Will try to get [facility] covered.  See above regarding routine cautions.  Continue work on diet and exercise, specifically weight training.  I thanked her for her effort.

## 2024-04-05 ENCOUNTER — Telehealth: Payer: Self-pay | Admitting: Pharmacy Technician

## 2024-04-05 ENCOUNTER — Other Ambulatory Visit (HOSPITAL_COMMUNITY): Payer: Self-pay

## 2024-04-05 NOTE — Telephone Encounter (Signed)
 Pharmacy Patient Advocate Encounter   Received notification from CoverMyMeds that prior authorization for Wegovy  0.25MG /0.5ML auto-injectors is required/requested.   Insurance verification completed.   The patient is insured through [facility] [place] .   Per test claim: PA required; PA submitted to above mentioned insurance via CoverMyMeds Key/confirmation #/EOC [id number] Status is pending

## 2024-05-03 DIAGNOSIS — N3941 Urge incontinence: Secondary | ICD-10-CM | POA: Diagnosis not present

## 2024-05-03 DIAGNOSIS — M6281 Muscle weakness (generalized): Secondary | ICD-10-CM | POA: Diagnosis not present

## 2024-05-03 DIAGNOSIS — M6289 Other specified disorders of muscle: Secondary | ICD-10-CM | POA: Diagnosis not present

## 2024-05-16 DIAGNOSIS — H04123 Dry eye syndrome of bilateral lacrimal glands: Secondary | ICD-10-CM | POA: Diagnosis not present

## 2024-05-16 DIAGNOSIS — H353132 Nonexudative age-related macular degeneration, bilateral, intermediate dry stage: Secondary | ICD-10-CM | POA: Diagnosis not present

## 2024-05-16 DIAGNOSIS — Z961 Presence of intraocular lens: Secondary | ICD-10-CM | POA: Diagnosis not present

## 2024-05-30 ENCOUNTER — Telehealth: Payer: Self-pay | Admitting: *Deleted

## 2024-05-30 NOTE — Telephone Encounter (Signed)
 This RN returned VM to pt per her message requesting a prescription for Zofran  ODT due to ongoing nausea now becoming more with feeling that she needs to vomit.  She stated she feels above is due to use of letrozole .  Symptoms of general nausea started March 2025 - she has noted over the past 2 weeks that the nausea seems to escalate and I need to run to the bathroom and I gagged .  This RN discussed noted new prescription for Wegovy  with pt verifying that this was started approximately 1 month ago.  This RN discussed need to hold the letrozole  starting now and over the weekend.  The nausea may be exacerbated by the Wegovy  and need to evaluate symptoms while holding the letrozole .  Pt is to call this RN on Monday for follow up of symptoms.

## 2024-05-31 ENCOUNTER — Other Ambulatory Visit: Payer: Self-pay | Admitting: Family Medicine

## 2024-05-31 ENCOUNTER — Telehealth: Payer: Self-pay

## 2024-05-31 DIAGNOSIS — M25559 Pain in unspecified hip: Secondary | ICD-10-CM

## 2024-05-31 NOTE — Telephone Encounter (Signed)
 Left message for patient to return call to the office. In the event that the patient calls back please schedule her for a lab only appointment. We received a pre-operative clearance request and Dr. [name] is requesting labs to be done.

## 2024-05-31 NOTE — Telephone Encounter (Signed)
 Spoke with patient regarding increasing issues with nausea.  Dr. Loretha aware of the situation and would encourage patient to continue taking her letrozole , as the increased nausea is likely due to her GLP-1. Patient aware and agreeable to reach out to Dr. [name] for recommendations. Patient also noted increased fatigue and joint pain. Reviewed interventions to help with side effects. Patient agreeable to trying interventions and verbalized an understanding of the education. Patient knows to call back should she have any additional questions or concerns.

## 2024-06-01 ENCOUNTER — Other Ambulatory Visit (INDEPENDENT_AMBULATORY_CARE_PROVIDER_SITE_OTHER)

## 2024-06-01 DIAGNOSIS — M25559 Pain in unspecified hip: Secondary | ICD-10-CM

## 2024-06-01 LAB — COMPREHENSIVE METABOLIC PANEL WITH GFR
ALT: 15 U/L (ref 0–35)
AST: 21 U/L (ref 0–37)
Albumin: 4.4 g/dL (ref 3.5–5.2)
Alkaline Phosphatase: 81 U/L (ref 39–117)
BUN: 11 mg/dL (ref 6–23)
CO2: 28 meq/L (ref 19–32)
Calcium: 9.8 mg/dL (ref 8.4–10.5)
Chloride: 105 meq/L (ref 96–112)
Creatinine, Ser: 1.02 mg/dL (ref 0.40–1.20)
GFR: 55.33 mL/min — ABNORMAL LOW (ref >60.00)
Glucose, Bld: 84 mg/dL (ref 70–99)
Potassium: 4.3 meq/L (ref 3.5–5.1)
Sodium: 140 meq/L (ref 135–145)
Total Bilirubin: 0.5 mg/dL (ref 0.2–1.2)
Total Protein: 7.2 g/dL (ref 6.0–8.3)

## 2024-06-01 LAB — CBC WITH DIFFERENTIAL/PLATELET
Basophils Absolute: 0 10*3/uL (ref 0.0–0.1)
Basophils Relative: 1 % (ref 0.0–3.0)
Eosinophils Absolute: 0.2 10*3/uL (ref 0.0–0.7)
Eosinophils Relative: 3.3 % (ref 0.0–5.0)
HCT: 38.9 % (ref 36.0–46.0)
Hemoglobin: 12.6 g/dL (ref 12.0–15.0)
Lymphocytes Relative: 36.4 % (ref 12.0–46.0)
Lymphs Abs: 1.8 10*3/uL (ref 0.7–4.0)
MCHC: 32.4 g/dL (ref 30.0–36.0)
MCV: 82.3 fl (ref 78.0–100.0)
Monocytes Absolute: 0.5 10*3/uL (ref 0.1–1.0)
Monocytes Relative: 10 % (ref 3.0–12.0)
Neutro Abs: 2.5 10*3/uL (ref 1.4–7.7)
Neutrophils Relative %: 49.3 % (ref 43.0–77.0)
Platelets: 309 10*3/uL (ref 150.0–400.0)
RBC: 4.73 Mil/uL (ref 3.87–5.11)
RDW: 14.9 % (ref 11.5–15.5)
WBC: 5 10*3/uL (ref 4.0–10.5)

## 2024-06-03 ENCOUNTER — Ambulatory Visit: Payer: Self-pay | Admitting: Family Medicine

## 2024-06-04 ENCOUNTER — Other Ambulatory Visit: Payer: Self-pay | Admitting: Family Medicine

## 2024-06-05 ENCOUNTER — Telehealth: Payer: Self-pay | Admitting: Family Medicine

## 2024-06-05 MED ORDER — ONDANSETRON 8 MG PO TBDP: 8.0000 mg | ORAL_TABLET | Freq: Two times a day (BID) | ORAL | 1 refills | Status: AC | PRN

## 2024-06-05 NOTE — Telephone Encounter (Signed)
 Historical med, okay to use.  Rx sent.  Thanks.

## 2024-06-05 NOTE — Telephone Encounter (Signed)
 LOV:04/03/24 WNC:WNUYPWH SCHEDULED  LAST REFILL: ALPRAZolam  Oral Tablet 0.5 MG 11/15/23 30 tablets 0 refills

## 2024-06-05 NOTE — Telephone Encounter (Signed)
Patient came in for labs. 

## 2024-06-05 NOTE — Telephone Encounter (Signed)
 Copied from CRM [id number]. Topic: Clinical - Medication Refill >> Jun 05, 2024 12:04 PM Robinson H wrote: Medication: ondansetron  (ZOFRAN -ODT) 8 MG disintegrating tablet   Has the patient contacted their pharmacy? No, medication shows discontinued reached out to Oncologist and was told to reach out to primary (Agent: If no, request that the patient contact the pharmacy for the refill. If patient does not wish to contact the pharmacy document the reason why and proceed with request.) (Agent: If yes, when and what did the pharmacy advise?)  This is the patient's preferred pharmacy:  [facility] [address], [place] - [address] ANNA MULLIGAN [place] [address] Phone: [phone] Fax: [phone]    Is this the correct pharmacy for this prescription? Yes If no, delete pharmacy and type the correct one.   Has the prescription been filled recently? No  Is the patient out of the medication? No  Has the patient been seen for an appointment in the last year OR does the patient have an upcoming appointment? Yes  Can we respond through MyChart? Yes  Agent: Please be advised that Rx refills may take up to 3 business days. We ask that you follow-up with your pharmacy.

## 2024-06-05 NOTE — Addendum Note (Signed)
 Addended by: [name] ARLYSS RAMAN on: 06/05/2024 04:53 PM   Modules accepted: Orders

## 2024-06-06 DIAGNOSIS — M25561 Pain in right knee: Secondary | ICD-10-CM | POA: Diagnosis not present

## 2024-06-06 NOTE — Telephone Encounter (Signed)
 Patient notified

## 2024-06-28 DIAGNOSIS — H353132 Nonexudative age-related macular degeneration, bilateral, intermediate dry stage: Secondary | ICD-10-CM | POA: Diagnosis not present

## 2024-06-28 DIAGNOSIS — H43811 Vitreous degeneration, right eye: Secondary | ICD-10-CM | POA: Diagnosis not present

## 2024-06-28 DIAGNOSIS — Z961 Presence of intraocular lens: Secondary | ICD-10-CM | POA: Diagnosis not present

## 2024-06-28 DIAGNOSIS — H04123 Dry eye syndrome of bilateral lacrimal glands: Secondary | ICD-10-CM | POA: Diagnosis not present

## 2024-07-06 ENCOUNTER — Telehealth: Payer: Self-pay

## 2024-07-06 NOTE — Telephone Encounter (Signed)
 Patient was identified as falling into the [employer] [place] Measure - Diabetes.   Patient was: Attribution and/or data issue.  Validation/Investigation needed.  Explanation:  Patient does not have diagnosis of diabetes.

## 2024-07-23 ENCOUNTER — Ambulatory Visit: Payer: Self-pay

## 2024-07-23 NOTE — Telephone Encounter (Signed)
 FYI Only or Action Required?: Action required by provider: request for appointment.  Patient was last seen in primary care on 04/03/2024 by Dana Arlyss RAMAN, MD.  Called Nurse Triage reporting No chief complaint on file..  Symptoms began about a month ago.  Interventions attempted: Nothing.  Symptoms are: gradually worsening.  Triage Disposition: See PCP Within 2 Weeks  Patient/caregiver understands and will follow disposition?: YesCopied from [employer] #[id number]. Topic: Clinical - Medical Advice >> Jul 23, 2024  1:27 PM Dana Harding wrote: Reason for CRM: Patient has a spot on inside of the right ankle. It is now gotten bigger, raised, and itchy. Patient would like to know if Dr. [name] would like her to come in or be referred to somewhere else. Reason for Disposition  [1] Skin growth or mole AND [2] two sides do not look the same (Harding.e., asymmetric)  Answer Assessment - Initial Assessment Questions Area has become darker over last month. Spot has increased in size too. Now is raised and use to be smooth.     1. APPEARANCE of LESION: What does it look like?      bumpy 2. SIZE: How big is it? (e.g., inches, cm; or compare to size of pinhead, tip of pen, eraser, coin, pea, grape, ping pong ball)      eraser 3. COLOR: What color is it? Is there more than one color?     Very dark  4. SHAPE: What shape is it? (e.g., round, irregular)     roundish 5. RAISED: Does it stick up above the skin or is it flat? (e.g., raised or elevated)     yes 6. TENDER: Does it hurt when you touch it?  (Scale 1-10; or mild, moderate, severe)     denies 7. LOCATION: Where is it located?      Inside of right ankle 8. ONSET: When did it first appear?      4 weeks ago  9. NUMBER: Is there just one? or Are there others?     one 10. CAUSE: What do you think it is?       Not sure 11. OTHER SYMPTOMS: Do you have any other symptoms? (e.g., fever)       denies  Protocols used: Skin Lesion -  Moles or Growths-A-AH

## 2024-07-23 NOTE — Telephone Encounter (Signed)
 I have been out of the office unexpectedly and I plan to return to clinic Tuesday.  I am reponding to messages as quickily as I can in the menatime.    I would like to see patient tomorrow as scheduled.  Thanks.

## 2024-07-24 ENCOUNTER — Ambulatory Visit (INDEPENDENT_AMBULATORY_CARE_PROVIDER_SITE_OTHER): Admitting: Family Medicine

## 2024-07-24 ENCOUNTER — Encounter: Payer: Self-pay | Admitting: Family Medicine

## 2024-07-24 VITALS — BP 128/86 | HR 87 | Temp 98.5°F | Ht 67.0 in | Wt 179.6 lb

## 2024-07-24 DIAGNOSIS — L989 Disorder of the skin and subcutaneous tissue, unspecified: Secondary | ICD-10-CM

## 2024-07-24 NOTE — Progress Notes (Unsigned)
 She is off tirzepatide in the meantime.  She got that through a compounding clinic.    She has small area on the medial R lower leg for years, since early 23s.  Present for decades. Recently larger and darker.  It was prev lighter.  Slightly raised now.  No bleeding.  No ulceration.  5 x7 mm.  Doesn't blanch.    She has orthopedic evaluation pending.  Discussed.  Meds, vitals, and allergies reviewed.   ROS: Per HPI unless specifically indicated in ROS section   Nad Ncat She has small lesion on the medial R lower leg No ulceration.  5 x7 mm.  Doesn't blanch.  It is a slightly raised hyperpigmented area.

## 2024-07-24 NOTE — Patient Instructions (Addendum)
 This may be benign but I would check with dermatology.  [facility].  [address] [place], [place] [address], [place]   Phone: [phone] Let me know if you can't get an appointment.  Take care.  Glad to see you.

## 2024-07-25 DIAGNOSIS — L989 Disorder of the skin and subcutaneous tissue, unspecified: Secondary | ICD-10-CM | POA: Insufficient documentation

## 2024-07-25 NOTE — Assessment & Plan Note (Signed)
 I suspect this is a benign lesion but I would like dermatology input.  Referral placed.  It could be an irritated seborrheic keratosis.  It does not look typical for a basal or squamous cell.  It does not look typical for melanoma but given the evolution of the lesion I would like dermatology input.  Rationale discussed with patient.  No charge visit.

## 2024-07-26 ENCOUNTER — Telehealth: Payer: Self-pay | Admitting: Family Medicine

## 2024-07-26 NOTE — Telephone Encounter (Signed)
 Copied from CRM [id number]. Topic: Referral - Status >> Jul 26, 2024 10:15 AM Charolett L wrote: Reason for CRM: patient would like the status of referral [phone]

## 2024-07-26 NOTE — Telephone Encounter (Signed)
 Left voicemail for patient to return call to office.  If the patient calls back please advise that referral has been sent but please allow 7 business days for the office that she was referred to to reach out to her or she can try calling them at [phone]. But we have to allow them time to receive and enter referral in their system

## 2024-07-27 NOTE — Telephone Encounter (Signed)
 Left voicemail for patient to return call to office.

## 2024-07-31 ENCOUNTER — Encounter: Payer: Self-pay | Admitting: *Deleted

## 2024-07-31 NOTE — Telephone Encounter (Signed)
 Closing encounter. Referral department sent mychart message to patient

## 2024-09-02 ENCOUNTER — Other Ambulatory Visit: Payer: Self-pay | Admitting: Family Medicine

## 2024-09-03 NOTE — Telephone Encounter (Signed)
 Sent. Thanks.

## 2024-09-03 NOTE — Telephone Encounter (Signed)
 LOV 07/24/24 Skin lesion  Last refill 02/18/22 #60 w/ 1 refill

## 2024-09-10 DIAGNOSIS — M25561 Pain in right knee: Secondary | ICD-10-CM | POA: Diagnosis not present

## 2024-09-12 ENCOUNTER — Telehealth: Payer: Self-pay

## 2024-09-12 DIAGNOSIS — M25559 Pain in unspecified hip: Secondary | ICD-10-CM

## 2024-09-12 NOTE — Telephone Encounter (Signed)
 Copied from CRM #[id number]. Topic: Clinical - Request for Lab/Test Order >> Sep 11, 2024  1:48 PM Dana Harding wrote: Reason for CRM: Patient is requesting labs prior to her left hip replacement surgery. Stated that her provider at [facility] advised her to come to us  for labs.

## 2024-09-12 NOTE — Telephone Encounter (Signed)
 Please call ortho.  She had labs done on 06/01/24.  Make sure they have those and see if they need anything else.  If other labs are requested, please find out what they specifically want.  Thanks.

## 2024-09-13 DIAGNOSIS — D485 Neoplasm of uncertain behavior of skin: Secondary | ICD-10-CM | POA: Diagnosis not present

## 2024-09-13 DIAGNOSIS — L819 Disorder of pigmentation, unspecified: Secondary | ICD-10-CM | POA: Diagnosis not present

## 2024-09-13 DIAGNOSIS — L308 Other specified dermatitis: Secondary | ICD-10-CM | POA: Diagnosis not present

## 2024-09-13 NOTE — Telephone Encounter (Signed)
 Reached out to [facility] and spoke with Sonny who connected me to [place]. Per Elenor she needs a CBC and CMET. The labs have to be done within 90 days if surgery and since she rescheduled the one from June is outdated. Please place orders and let me know. So that I can call and get patient scheduled for lab appointment.

## 2024-09-13 NOTE — Telephone Encounter (Signed)
 Thanks.  I put in the follow up orders.

## 2024-09-13 NOTE — Telephone Encounter (Signed)
 Patient has been scheduled for a lab only appointment on 11/3

## 2024-09-13 NOTE — Addendum Note (Signed)
 Addended by: [name] ARLYSS RAMAN on: 09/13/2024 02:06 PM   Modules accepted: Orders

## 2024-09-14 ENCOUNTER — Telehealth: Payer: Self-pay | Admitting: Hematology and Oncology

## 2024-09-14 NOTE — Telephone Encounter (Signed)
 Patient appointment rescheduled from 09/19/2024 to 09/26/2024 due to provider being on call. I spoke to patient and she is aware of changes. Reena NOVAK - Scheduler rescheduled on my behalf due to me not having overbook access at this time.

## 2024-09-19 ENCOUNTER — Ambulatory Visit: Admitting: Hematology and Oncology

## 2024-09-19 ENCOUNTER — Other Ambulatory Visit (HOSPITAL_COMMUNITY): Payer: Self-pay

## 2024-09-19 ENCOUNTER — Telehealth: Payer: Self-pay

## 2024-09-19 DIAGNOSIS — R928 Other abnormal and inconclusive findings on diagnostic imaging of breast: Secondary | ICD-10-CM | POA: Diagnosis not present

## 2024-09-19 DIAGNOSIS — Z23 Encounter for immunization: Secondary | ICD-10-CM | POA: Diagnosis not present

## 2024-09-19 DIAGNOSIS — Z853 Personal history of malignant neoplasm of breast: Secondary | ICD-10-CM | POA: Diagnosis not present

## 2024-09-19 LAB — HM MAMMOGRAPHY

## 2024-09-19 NOTE — Telephone Encounter (Signed)
 Pharmacy Patient Advocate Encounter  Received notification from [employer] that Prior Authorization for Librax has been DENIED.  Full denial letter will be uploaded to the media tab. See denial reason below.     PA #/Case ID/Reference #: # [id number]

## 2024-09-19 NOTE — Telephone Encounter (Signed)
 Pharmacy Patient Advocate Encounter   Received notification from Onbase that prior authorization for Librax caps is required/requested.   Insurance verification completed.   The patient is insured through [facility].   Per test claim: PA required; PA submitted to above mentioned insurance via Latent Key/confirmation #/EOC [id number] Status is pending

## 2024-09-20 ENCOUNTER — Encounter: Payer: Self-pay | Admitting: Adult Health

## 2024-09-20 DIAGNOSIS — M94261 Chondromalacia, right knee: Secondary | ICD-10-CM | POA: Diagnosis not present

## 2024-09-23 ENCOUNTER — Ambulatory Visit: Payer: Self-pay | Admitting: Family Medicine

## 2024-09-25 ENCOUNTER — Telehealth: Payer: Self-pay

## 2024-09-25 NOTE — Telephone Encounter (Signed)
 Spoke with patient and confirmed appointment on 10/22

## 2024-09-26 ENCOUNTER — Inpatient Hospital Stay: Attending: Hematology and Oncology | Admitting: Hematology and Oncology

## 2024-09-26 VITALS — BP 130/53 | HR 81 | Temp 98.1°F | Resp 17 | Wt 171.9 lb

## 2024-09-26 DIAGNOSIS — R5383 Other fatigue: Secondary | ICD-10-CM | POA: Insufficient documentation

## 2024-09-26 DIAGNOSIS — S73191A Other sprain of right hip, initial encounter: Secondary | ICD-10-CM | POA: Insufficient documentation

## 2024-09-26 DIAGNOSIS — Z803 Family history of malignant neoplasm of breast: Secondary | ICD-10-CM | POA: Insufficient documentation

## 2024-09-26 DIAGNOSIS — C50111 Malignant neoplasm of central portion of right female breast: Secondary | ICD-10-CM | POA: Insufficient documentation

## 2024-09-26 DIAGNOSIS — Z79811 Long term (current) use of aromatase inhibitors: Secondary | ICD-10-CM | POA: Diagnosis not present

## 2024-09-26 DIAGNOSIS — M8580 Other specified disorders of bone density and structure, unspecified site: Secondary | ICD-10-CM | POA: Insufficient documentation

## 2024-09-26 DIAGNOSIS — Z87891 Personal history of nicotine dependence: Secondary | ICD-10-CM | POA: Diagnosis not present

## 2024-09-26 DIAGNOSIS — Z923 Personal history of irradiation: Secondary | ICD-10-CM | POA: Diagnosis not present

## 2024-09-26 DIAGNOSIS — Z800 Family history of malignant neoplasm of digestive organs: Secondary | ICD-10-CM | POA: Insufficient documentation

## 2024-09-26 DIAGNOSIS — Z170 Estrogen receptor positive status [ER+]: Secondary | ICD-10-CM | POA: Insufficient documentation

## 2024-09-26 DIAGNOSIS — Z8052 Family history of malignant neoplasm of bladder: Secondary | ICD-10-CM | POA: Diagnosis not present

## 2024-09-26 NOTE — Progress Notes (Signed)
 BRIEF ONCOLOGIC HISTORY:  Oncology History  Malignant neoplasm of central portion of right breast in female, estrogen receptor positive (HCC)  08/10/2023 Mammogram   Mammogram showed architectural distortion in the right breast. Diagnostic mammo showed 0.4 cm central lucent architectural distortion in the right breast. Stereotactic biopsy is recommended.   08/18/2023 Pathology Results    1. Breast, right, needle core biopsy, mass retroareolar :       - INVASIVE DUCTAL CARCINOMA, SEE NOTE       - TUBULE FORMATION: SCORE 2       - NUCLEAR PLEOMORPHISM: SCORE 2       - MITOTIC COUNT: SCORE 1       - TOTAL SCORE: 5       - OVERALL GRADE: 1       - LYMPHOVASCULAR INVASION: NOT IDENTIFIED       - CANCER LENGTH: 0.5 CM       - CALCIFICATIONS: NOT IDENTIFIED   Results:  IMMUNOHISTOCHEMICAL AND MORPHOMETRIC ANALYSIS PERFORMED MANUALLY  The tumor cells are NEGATIVE for Her2 (1+).  Estrogen Receptor:  100%, POSITIVE, STRONG STAINING INTENSITY  Progesterone Receptor:  50%, POSITIVE, STRONG STAINING INTENSITY  Proliferation Marker Ki67:  5%    08/24/2023 Cancer Staging   Staging form: Breast, AJCC 8th Edition - Clinical stage from 08/24/2023: Stage IA (cT1a, cN0, cM0, G1, ER+, PR+, HER2-) - Signed by Loretha Ash, MD on 08/24/2023 Stage prefix: Initial diagnosis Histologic grading system: 3 grade system Laterality: Right Staged by: Pathologist and managing physician Stage used in treatment planning: Yes National guidelines used in treatment planning: Yes Type of national guideline used in treatment planning: NCCN   09/14/2023 Surgery   Right breast lumpectomy: IDC, 1.4 cm, grade 1, margins negative, ER+, PR+, HER2 negative; 0 SLN biopsied   09/14/2023 Oncotype testing   23/9%    Genetic Testing   [facility] CancerNext-Expanded Panel+RNA was Negative. Report date is 09/01/2023.   The CancerNext-Expanded gene panel offered by [facility] and includes sequencing, rearrangement, and RNA analysis  for the following 71 genes: AIP, ALK, APC, ATM, AXIN2, BAP1, BARD1, BMPR1A, BRCA1, BRCA2, BRIP1, CDC73, CDH1, CDK4, CDKN1B, CDKN2A, CHEK2, CTNNA1, DICER1, FH, FLCN, KIF1B, LZTR1, MAX, MEN1, MET, MLH1, MSH2, MSH3, MSH6, MUTYH, NF1, NF2, NTHL1, PALB2, PHOX2B, PMS2, POT1, PRKAR1A, PTCH1, PTEN, RAD51C, RAD51D, RB1, RET, SDHA, SDHAF2, SDHB, SDHC, SDHD, SMAD4, SMARCA4, SMARCB1, SMARCE1, STK11, SUFU, TMEM127, TP53, TSC1, TSC2, and VHL (sequencing and deletion/duplication); EGFR, EGLN1, HOXB13, KIT, MITF, PDGFRA, POLD1, and POLE (sequencing only); EPCAM and GREM1 (deletion/duplication only).    09/14/2023 Cancer Staging   Staging form: Breast, AJCC 8th Edition - Pathologic stage from 09/14/2023: Stage IA (pT1c, pN0, cM0, G1, ER+, PR+, HER2-, Oncotype DX score: 23) - Signed by Crawford Morna Pickle, NP on 03/19/2024 Stage prefix: Initial diagnosis Multigene prognostic tests performed: Oncotype DX Recurrence score range: Greater than or equal to 11 Histologic grading system: 3 grade system   10/25/2023 - 11/22/2023 Radiation Therapy   Plan Name: Breast_R Site: Breast, Right Technique: 3D Mode: Photon Dose Per Fraction: 2.67 Gy Prescribed Dose (Delivered / Prescribed): 40.05 Gy / 40.05 Gy Prescribed Fxs (Delivered / Prescribed): 15 / 15   Plan Name: Breast_R_Bst Site: Breast, Right Technique: 3D Mode: Photon Dose Per Fraction: 2 Gy Prescribed Dose (Delivered / Prescribed): 10 Gy / 10 Gy Prescribed Fxs (Delivered / Prescribed): 5 / 5   12/2023 -  Anti-estrogen oral therapy   Letrozole      INTERVAL HISTORY:   Discussed the use  of AI scribe software for clinical note transcription with the patient, who gave verbal consent to proceed.  History of Present Illness Dana Harding is a 73 year old female with breast cancer on letrozole  who presents with fatigue.  She experiences significant fatigue, which she attributes to letrozole . The fatigue slows her down considerably, although she forces  herself to keep moving due to her responsibilities. She acknowledges insufficient sleep, which may contribute to her fatigue. Recently, she undertook a six-week trip to [facility] to care for her father, who was dying from chronic kidney disease and has since passed away.  She is scheduled for a hip replacement the week of Thanksgiving, postponed from last month due to her father's situation. The hip issue is due to a torn labrum, and given her age, a replacement is recommended over repair. She has been working on losing weight in preparation for the surgery.  Her social history includes being actively involved with her grandchildren, who are 58, and 69 years old. She enjoys spending time with them and attending their activities, such as horse shows. She recently moved a friend from [place]  to [place]  to another assisted living facility, adding to her responsibilities.  She has a history of mild osteopenia. Her last mammogram was normal, and she is due for her next one in a year.  Breast: Denies any new nodularity, masses, tenderness, nipple changes, or nipple discharge.       PAST MEDICAL/SURGICAL HISTORY:  Past Medical History:  Diagnosis Date   Anxiety    Cancer (HCC) 2024   Right Breast Cancer   Cataract 2020   correcting with surgery   Dizziness and giddiness    intermittent   Esophageal spasm    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   bilateral articular L4-5; per MRI   Family history of malignant neoplasm of breast    Fitting and adjustment of gastric lap band    GERD (gastroesophageal reflux disease)    History of radiation therapy    Right breast- 10/25/23-11/22/23- Dr. Lynwood Nasuti   IBS (irritable bowel syndrome)    Inflammatory arthritis    H/o elevated ANA, treated with plaquenil   Lumbar foraminal stenosis 02/09/2007   mild; L4-5; per MRI   Mitral regurgitation 03/23/2007   mild   Obesity    Obstructive sleep apnea (adult) (pediatric)    prev used CPAP,  resolved and off CPAP as of 2016   Tricuspid regurgitation 03/23/2007   mild to moderate   Past Surgical History:  Procedure Laterality Date   BLEPHAROPLASTY     BREAST LUMPECTOMY WITH RADIOACTIVE SEED LOCALIZATION Right 09/14/2023   Procedure: RIGHT BREAST LUMPECTOMY WITH RADIOACTIVE SEED LOCALIZATION;  Surgeon: Vanderbilt Ned, MD;  Location: [facility] OR;  Service: General;  Laterality: Right;   BREAST REDUCTION SURGERY  2009   CATARACT EXTRACTION Left 01/01/2019   CATARACT EXTRACTION W/ INTRAOCULAR LENS IMPLANT Right 12/11/2018   CHOLECYSTECTOMY  1980's   COSMETIC SURGERY  2021   lower blepharoplasty   EYE SURGERY  2020   cataract surgery   INGUINAL HERNIA REPAIR Bilateral    x2   LAPAROSCOPIC GASTRIC BANDING  2007   removed   SHOULDER ARTHROSCOPY WITH ROTATOR CUFF REPAIR AND SUBACROMIAL DECOMPRESSION Left 02/22/2013   Procedure: LEFT SHOULDER ARTHROSCOPY WITH SUBACROMIAL DECOMPRESSION DISTAL CLAVICLE RESECTION with ROTATOR CUFF REPAIR  ;  Surgeon: Franky CHRISTELLA Pointer, MD;  Location: [facility] OR;  Service: Orthopedics;  Laterality: Left;   TONSILLECTOMY AND ADENOIDECTOMY  1960   tummy  tuck  1996   UMBILICAL HERNIA REPAIR     As a child     ALLERGIES:  Allergies  Allergen Reactions   Latex Itching and Rash   Aspirin Other (See Comments)    Stomach pain   Prilosec [Omeprazole ] Nausea Only     CURRENT MEDICATIONS:  Outpatient Encounter Medications as of 09/26/2024  Medication Sig   ALPRAZolam  (XANAX ) 0.5 MG tablet TAKE HALF TO ONE TABLET BY MOUTH DAILY AS NEEDED FOR ANXIETY   Ascorbic Acid (VITAMIN C) 1000 MG tablet Take 1,000 mg by mouth daily.   azelastine  (ASTELIN ) 0.1 % nasal spray Place 1-2 sprays into both nostrils 2 (two) times daily as needed for rhinitis.   B Complex Vitamins (VITAMIN B COMPLEX PO) Take 1 tablet by mouth every other day.   carboxymethylcellulose (REFRESH PLUS) 0.5 % SOLN Place 1 drop into both eyes in the morning and at bedtime.   Cholecalciferol (VITAMIN D-3)  125 MCG (5000 UT) TABS Take 2,000 Units by mouth every other day.   clidinium-chlordiazePOXIDE  (LIBRAX) 5-2.5 MG capsule TAKE ONE CAPSULE BY MOUTH THREE TIMES DAILY AS NEEDED   cyclobenzaprine  (FLEXERIL ) 10 MG tablet Take 0.5-1 tablets (5-10 mg total) by mouth 3 (three) times daily as needed for muscle spasms.   Fluocinolone Acetonide Scalp 0.01 % OIL Apply 1 application topically daily as needed for irritation.   fluticasone (FLONASE) 50 MCG/ACT nasal spray Place 1-2 sprays into both nostrils daily as needed for allergies or rhinitis.   HYDROcodone -acetaminophen  (NORCO/VICODIN) 5-325 MG tablet Take 1 tablet by mouth 3 (three) times daily as needed for moderate pain (sedation caution).   letrozole  (FEMARA ) 2.5 MG tablet Take 1 tablet (2.5 mg total) by mouth daily.   Multiple Vitamins-Minerals (PRESERVISION AREDS 2 PO) Take 1 capsule by mouth in the morning and at bedtime.   naproxen  sodium (ALEVE ) 220 MG tablet Take 220 mg by mouth daily as needed (pain).   Omega-3 Fatty Acids (OMEGA 3 PO) Take 1 capsule by mouth every other day.   ondansetron  (ZOFRAN -ODT) 8 MG disintegrating tablet Take 1 tablet (8 mg total) by mouth 2 (two) times daily as needed for nausea or vomiting.   oxybutynin  (DITROPAN  XL) 15 MG 24 hr tablet Take 1 tablet (15 mg total) by mouth at bedtime.   zinc gluconate 50 MG tablet Take 50 mg by mouth daily as needed (cold symptoms).   No facility-administered encounter medications on file as of 09/26/2024.     ONCOLOGIC FAMILY HISTORY:  Family History  Problem Relation Age of Onset   Breast cancer Mother 40       bilateral   Cancer Mother    Stroke Father        < 60   Heart disease Father    Kidney disease Father    Bladder Cancer Sister 15   Cancer Sister 40       Neuroendocrine tumor found incidentally in the small intestine   Colon cancer Paternal Uncle      SOCIAL HISTORY:  Social History   Socioeconomic History   Marital status: Single    Spouse name: Not on  file   Number of children: 1   Years of education: Not on file   Highest education level: Professional school degree (e.g., MD, DDS, DVM, JD)  Occupational History   Not on file  Tobacco Use   Smoking status: Former    Types: Cigarettes   Smokeless tobacco: Never   Tobacco comments:    Quit in  1980  Vaping Use   Vaping status: Never Used  Substance and Sexual Activity   Alcohol use: Yes    Alcohol/week: 2.0 standard drinks of alcohol    Types: 1 Glasses of wine, 1 Standard drinks or equivalent per week    Comment: Socially   Drug use: No   Sexual activity: Not Currently    Birth control/protection: Post-menopausal  Other Topics Concern   Not on file  Social History Narrative   Single   1 daughter-family physician   Undergrad at [employer] and law degree at [employer]; 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   Social Drivers of Health   Financial Resource Strain: Low Risk  (09/15/2023)   Overall Financial Resource Strain (CARDIA)    Difficulty of Paying Living Expenses: Not hard at all  Food Insecurity: No Food Insecurity (10/12/2023)   Hunger Vital Sign    Worried About Running Out of Food in the Last Year: Never true    Ran Out of Food in the Last Year: Never true  Transportation Needs: No Transportation Needs (10/12/2023)   PRAPARE - Administrator, Civil Service (Medical): No    Lack of Transportation (Non-Medical): No  Physical Activity: Insufficiently Active (09/15/2023)   Exercise Vital Sign    Days of Exercise per Week: 2 days    Minutes of Exercise per Session: 40 min  Stress: No Stress Concern Present (09/15/2023)   [employer] of Occupational Health - Occupational Stress Questionnaire    Feeling of Stress : Not at all  Social Connections: Socially Isolated (09/15/2023)   Social Connection and Isolation Panel    Frequency of Communication with Friends and Family: Once a week    Frequency of Social Gatherings with Friends and Family: Twice a week     Attends Religious Services: Never    Database administrator or Organizations: No    Attends Banker Meetings: Never    Marital Status: Divorced  Catering manager Violence: Not At Risk (10/12/2023)   Humiliation, Afraid, Rape, and Kick questionnaire    Fear of Current or Ex-Partner: No    Emotionally Abused: No    Physically Abused: No    Sexually Abused: No     OBSERVATIONS/OBJECTIVE:  BP (!) 130/53 (BP Location: Left Arm, Patient Position: Sitting)   Pulse 81   Temp 98.1 F (36.7 C) (Temporal)   Resp 17   Wt 171 lb 14.4 oz (78 kg)   SpO2 100%   BMI 26.92 kg/m   GENERAL: Patient is a well appearing female in no acute distress Bilateral breasts inspected and palpated. Excellent cosmesis. No palpable breast masses. No regional adenopathy CTA bilaterally RRR No LE edema.  LABORATORY DATA:  None for this visit.  DIAGNOSTIC IMAGING:  None for this visit.      ASSESSMENT AND PLAN:  Ms.. Stukes is a pleasant 73 y.o. female with Stage IA right breast invasive ductal carcinoma, ER+/PR+/HER2-, diagnosed in 08/2023, treated with lumpectomy, adjuvant radiation therapy, and anti-estrogen therapy with Letrozole  beginning in 12/2023.    Assessment and Plan Assessment & Plan Breast cancer on letrozole  therapy Managed with letrozole , tolerated well with fatigue as a side effect. Recent mammogram normal. - Continue letrozole  therapy. - Schedule next mammogram in one year. - Coordinate follow-up with breast surgeon Dr. Ananias in spring.  Fatigue secondary to letrozole  and psychosocial stressors Fatigue impacts daily activities, linked to letrozole  and psychosocial stressors. Sleep insufficiency may contribute. - Encourage adequate  rest and stress management.   Right hip labral tear, scheduled for hip replacement Scheduled for hip replacement due to age-related considerations and future replacement likelihood. - Proceed with hip replacement surgery as  scheduled.  Osteopenia Mild osteopenia, not a major concern. Previous scan showed very mild osteopenia. - Encourage walking and weight-bearing exercises. - Ensure adequate vitamin D intake.  Time spent: 30 min  *Total Encounter Time as defined by the Centers for Medicare and Medicaid Services includes, in addition to the face-to-face time of a patient visit (documented in the note above) non-face-to-face time: obtaining and reviewing outside history, ordering and reviewing medications, tests or procedures, care coordination (communications with other health care professionals or caregivers) and documentation in the medical record.

## 2024-09-27 ENCOUNTER — Telehealth: Payer: Self-pay

## 2024-09-27 NOTE — Telephone Encounter (Signed)
 Received faxed surgical clearance form from [facility] of [facility]- [place], [address]. Pt is scheduled on 10/30/24 for L THA with Dr Dempsey Lawn.   Pt has pre-op lab visit on 10/10/23 at 9:30.  [Placed form in Dr Elfredia box on desk pending lab results. Then need to be faxed, with results, to [facility] at [phone], attn:  Kirke Amel.]

## 2024-09-30 ENCOUNTER — Ambulatory Visit: Payer: Self-pay | Admitting: Family Medicine

## 2024-09-30 NOTE — Telephone Encounter (Signed)
 Will await her lab visit on 10/09/24.  Thanks.

## 2024-10-03 DIAGNOSIS — H43811 Vitreous degeneration, right eye: Secondary | ICD-10-CM | POA: Diagnosis not present

## 2024-10-08 ENCOUNTER — Other Ambulatory Visit

## 2024-10-09 ENCOUNTER — Other Ambulatory Visit

## 2024-10-10 DIAGNOSIS — H43811 Vitreous degeneration, right eye: Secondary | ICD-10-CM | POA: Diagnosis not present

## 2024-10-11 ENCOUNTER — Other Ambulatory Visit (INDEPENDENT_AMBULATORY_CARE_PROVIDER_SITE_OTHER)

## 2024-10-11 ENCOUNTER — Ambulatory Visit (INDEPENDENT_AMBULATORY_CARE_PROVIDER_SITE_OTHER): Admitting: Family Medicine

## 2024-10-11 ENCOUNTER — Encounter: Payer: Self-pay | Admitting: Family Medicine

## 2024-10-11 VITALS — BP 136/82 | HR 80 | Temp 98.9°F | Ht 67.0 in | Wt 174.1 lb

## 2024-10-11 DIAGNOSIS — R4586 Emotional lability: Secondary | ICD-10-CM

## 2024-10-11 DIAGNOSIS — Z23 Encounter for immunization: Secondary | ICD-10-CM | POA: Diagnosis not present

## 2024-10-11 DIAGNOSIS — M25559 Pain in unspecified hip: Secondary | ICD-10-CM | POA: Diagnosis not present

## 2024-10-11 DIAGNOSIS — M722 Plantar fascial fibromatosis: Secondary | ICD-10-CM

## 2024-10-11 LAB — CBC WITH DIFFERENTIAL/PLATELET
Basophils Absolute: 0.1 K/uL (ref 0.0–0.1)
Basophils Relative: 0.8 % (ref 0.0–3.0)
Eosinophils Absolute: 0.1 K/uL (ref 0.0–0.7)
Eosinophils Relative: 1.8 % (ref 0.0–5.0)
HCT: 39 % (ref 36.0–46.0)
Hemoglobin: 12.7 g/dL (ref 12.0–15.0)
Lymphocytes Relative: 27.5 % (ref 12.0–46.0)
Lymphs Abs: 1.8 K/uL (ref 0.7–4.0)
MCHC: 32.6 g/dL (ref 30.0–36.0)
MCV: 82.8 fl (ref 78.0–100.0)
Monocytes Absolute: 0.5 K/uL (ref 0.1–1.0)
Monocytes Relative: 7.6 % (ref 3.0–12.0)
Neutro Abs: 4.1 K/uL (ref 1.4–7.7)
Neutrophils Relative %: 62.3 % (ref 43.0–77.0)
Platelets: 329 K/uL (ref 150.0–400.0)
RBC: 4.7 Mil/uL (ref 3.87–5.11)
RDW: 15.7 % — ABNORMAL HIGH (ref 11.5–15.5)
WBC: 6.6 K/uL (ref 4.0–10.5)

## 2024-10-11 LAB — COMPREHENSIVE METABOLIC PANEL WITH GFR
ALT: 12 U/L (ref 0–35)
AST: 20 U/L (ref 0–37)
Albumin: 4.3 g/dL (ref 3.5–5.2)
Alkaline Phosphatase: 74 U/L (ref 39–117)
BUN: 15 mg/dL (ref 6–23)
CO2: 28 meq/L (ref 19–32)
Calcium: 9.7 mg/dL (ref 8.4–10.5)
Chloride: 103 meq/L (ref 96–112)
Creatinine, Ser: 1.13 mg/dL (ref 0.40–1.20)
GFR: 48.81 mL/min — ABNORMAL LOW (ref >60.00)
Glucose, Bld: 101 mg/dL — ABNORMAL HIGH (ref 70–99)
Potassium: 3.7 meq/L (ref 3.5–5.1)
Sodium: 139 meq/L (ref 135–145)
Total Bilirubin: 0.6 mg/dL (ref 0.2–1.2)
Total Protein: 7.4 g/dL (ref 6.0–8.3)

## 2024-10-11 MED ORDER — ESCITALOPRAM OXALATE 10 MG PO TABS: 10.0000 mg | ORAL_TABLET | Freq: Every day | ORAL | 1 refills | Status: DC

## 2024-10-11 NOTE — Patient Instructions (Addendum)
 Let me know if you want to set up counseling.   Reasonable to start lexapro if not improving and feeling better.    Likely plantar fasciitis.   Stretch prior to getting up or out of bed.  Use a frozen water bottle under your arch, rolling it back and forth.   Don't go barefoot.  Update me as needed.   Take care.  Glad to see you.  We'll update you about your labs.

## 2024-10-11 NOTE — Progress Notes (Signed)
 She had labs done today.  Will get a high dose flu shot today. Hip surgery pending. No CP.  Not SOB.  She is limited by hip pain but not by CP or SOB.    She has L plantar pain, esp with first step in the AM, more pain barefoot.  No trauma.  Pain started last week.  No pain laying down.  No R foot pain.    Mood d/w pt.  Her father died- she travelled to [place]  to help him at the end of his life.  He was 18 and died from ESRD.  She then had to help another friend who was sick.  She is exhausted.  She is tearful and frustrated.  Grief d/w pt.  More irritable and this is atypical for patient.  No SI/HI.  Sleep d/w pt, at baseline.    Rare naproxen  use, nsaid cautions d/w pt.    Meds, vitals, and allergies reviewed.   ROS: Per HPI unless specifically indicated in ROS section   Nad Ncat Tearful but regains composure. Neck supple no LA Rrr Ctab  Abdomen soft. Speech and judgment intact. Left foot with soreness at the origin of the plantar fascia.  Medial lateral malleolus are nontender.  Achilles not tender.  Able to bear weight.  No bruising or erythema.  32 minutes were devoted to patient care in this encounter (this includes time spent reviewing the patient's file/history, interviewing and examining the patient, counseling/reviewing plan with patient).

## 2024-10-14 ENCOUNTER — Ambulatory Visit: Payer: Self-pay | Admitting: Family Medicine

## 2024-10-14 DIAGNOSIS — M722 Plantar fascial fibromatosis: Secondary | ICD-10-CM | POA: Insufficient documentation

## 2024-10-14 DIAGNOSIS — R4586 Emotional lability: Secondary | ICD-10-CM | POA: Insufficient documentation

## 2024-10-14 NOTE — Assessment & Plan Note (Signed)
 Assuming her labs are unremarkable then she would appear to be appropriately low risk for surgery.  See notes on labs.

## 2024-10-14 NOTE — Assessment & Plan Note (Addendum)
 Grief discussed with patient.  Support offered. She can let me know if she wants to set up counseling.   Reasonable to start lexapro if not improving and feeling better.  Routine cautions given to patient.

## 2024-10-14 NOTE — Assessment & Plan Note (Signed)
 Likely plantar fasciitis.   Stretch prior to getting up or out of bed.  Use a frozen water bottle under the arch, rolling it back and forth.   Don't go barefoot.  Update me as needed.

## 2024-10-30 DIAGNOSIS — M1612 Unilateral primary osteoarthritis, left hip: Secondary | ICD-10-CM | POA: Diagnosis not present

## 2024-11-15 ENCOUNTER — Ambulatory Visit: Payer: Self-pay | Admitting: *Deleted

## 2024-11-15 ENCOUNTER — Other Ambulatory Visit: Payer: Self-pay

## 2024-11-15 ENCOUNTER — Emergency Department (HOSPITAL_COMMUNITY)

## 2024-11-15 ENCOUNTER — Encounter (HOSPITAL_COMMUNITY): Payer: Self-pay

## 2024-11-15 ENCOUNTER — Emergency Department (HOSPITAL_COMMUNITY)
Admission: EM | Admit: 2024-11-15 | Discharge: 2024-11-15 | Disposition: A | Discharge status: Home / Self Care | Source: Ambulatory Visit | Attending: Emergency Medicine | Admitting: Emergency Medicine

## 2024-11-15 DIAGNOSIS — K529 Noninfective gastroenteritis and colitis, unspecified: Secondary | ICD-10-CM | POA: Insufficient documentation

## 2024-11-15 DIAGNOSIS — Z853 Personal history of malignant neoplasm of breast: Secondary | ICD-10-CM | POA: Insufficient documentation

## 2024-11-15 DIAGNOSIS — D72829 Elevated white blood cell count, unspecified: Secondary | ICD-10-CM | POA: Diagnosis not present

## 2024-11-15 DIAGNOSIS — Z91040 Latex allergy status: Secondary | ICD-10-CM | POA: Insufficient documentation

## 2024-11-15 DIAGNOSIS — K625 Hemorrhage of anus and rectum: Secondary | ICD-10-CM | POA: Diagnosis present

## 2024-11-15 DIAGNOSIS — R109 Unspecified abdominal pain: Secondary | ICD-10-CM | POA: Diagnosis not present

## 2024-11-15 DIAGNOSIS — R9431 Abnormal electrocardiogram [ECG] [EKG]: Secondary | ICD-10-CM | POA: Diagnosis not present

## 2024-11-15 DIAGNOSIS — Z9049 Acquired absence of other specified parts of digestive tract: Secondary | ICD-10-CM | POA: Diagnosis not present

## 2024-11-15 LAB — URINALYSIS, ROUTINE W REFLEX MICROSCOPIC
Bilirubin Urine: NEGATIVE
Glucose, UA: NEGATIVE mg/dL
Ketones, ur: 5 mg/dL — AB
Nitrite: NEGATIVE
Protein, ur: NEGATIVE mg/dL
Specific Gravity, Urine: 1.005 (ref 1.005–1.030)
pH: 6 (ref 5.0–8.0)

## 2024-11-15 LAB — PROTIME-INR
INR: 1 (ref 0.8–1.2)
Prothrombin Time: 13.8 s (ref 11.4–15.2)

## 2024-11-15 LAB — CBC
HCT: 38.6 % (ref 36.0–46.0)
Hemoglobin: 12.8 g/dL (ref 12.0–15.0)
MCH: 27.2 pg (ref 26.0–34.0)
MCHC: 33.2 g/dL (ref 30.0–36.0)
MCV: 82 fL (ref 80.0–100.0)
Platelets: 483 K/uL — ABNORMAL HIGH (ref 150–400)
RBC: 4.71 MIL/uL (ref 3.87–5.11)
RDW: 14.4 % (ref 11.5–15.5)
WBC: 12.4 K/uL — ABNORMAL HIGH (ref 4.0–10.5)
nRBC: 0 % (ref 0.0–0.2)

## 2024-11-15 LAB — I-STAT CG4 LACTIC ACID, ED: Lactic Acid, Venous: 1.9 mmol/L (ref 0.5–1.9)

## 2024-11-15 LAB — COMPREHENSIVE METABOLIC PANEL WITH GFR
ALT: 30 U/L (ref 0–44)
AST: 24 U/L (ref 15–41)
Albumin: 4.3 g/dL (ref 3.5–5.0)
Alkaline Phosphatase: 160 U/L — ABNORMAL HIGH (ref 38–126)
Anion gap: 14 (ref 5–15)
BUN: 11 mg/dL (ref 8–23)
CO2: 23 mmol/L (ref 22–32)
Calcium: 10.2 mg/dL (ref 8.9–10.3)
Chloride: 100 mmol/L (ref 98–111)
Creatinine, Ser: 0.9 mg/dL (ref 0.44–1.00)
GFR, Estimated: >60 mL/min (ref >60)
Glucose, Bld: 107 mg/dL — ABNORMAL HIGH (ref 70–99)
Potassium: 4.1 mmol/L (ref 3.5–5.1)
Sodium: 137 mmol/L (ref 135–145)
Total Bilirubin: 0.7 mg/dL (ref 0.0–1.2)
Total Protein: 7.7 g/dL (ref 6.5–8.1)

## 2024-11-15 LAB — RESP PANEL BY RT-PCR (RSV, FLU A&B, COVID)  RVPGX2
Influenza A by PCR: NEGATIVE
Influenza B by PCR: NEGATIVE
Resp Syncytial Virus by PCR: NEGATIVE
SARS Coronavirus 2 by RT PCR: NEGATIVE

## 2024-11-15 LAB — TYPE AND SCREEN
ABO/RH(D): A POS
Antibody Screen: NEGATIVE

## 2024-11-15 LAB — LIPASE, BLOOD: Lipase: 13 U/L (ref 11–51)

## 2024-11-15 LAB — POC OCCULT BLOOD, ED: Fecal Occult Bld: POSITIVE — AB

## 2024-11-15 MED ORDER — IOHEXOL 300 MG/ML  SOLN
100.0000 mL | Freq: Once | INTRAMUSCULAR | Status: AC | PRN
  Administered 2024-11-15: 100 mL via INTRAVENOUS

## 2024-11-15 MED ORDER — ACETAMINOPHEN 500 MG PO TABS
1000.0000 mg | ORAL_TABLET | Freq: Once | ORAL | Status: AC
  Administered 2024-11-15: 1000 mg via ORAL
  Filled 2024-11-15: qty 2

## 2024-11-15 MED ORDER — LACTATED RINGERS IV SOLN: INTRAVENOUS | Status: DC

## 2024-11-15 MED ORDER — ONDANSETRON HCL 4 MG/2ML IJ SOLN
4.0000 mg | Freq: Once | INTRAMUSCULAR | Status: AC
  Administered 2024-11-15: 4 mg via INTRAVENOUS
  Filled 2024-11-15: qty 2

## 2024-11-15 MED ORDER — LACTATED RINGERS IV BOLUS
1000.0000 mL | Freq: Once | INTRAVENOUS | Status: AC
  Administered 2024-11-15: 1000 mL via INTRAVENOUS

## 2024-11-15 MED ORDER — PANTOPRAZOLE SODIUM 40 MG IV SOLR
40.0000 mg | Freq: Once | INTRAVENOUS | Status: AC
  Administered 2024-11-15: 40 mg via INTRAVENOUS
  Filled 2024-11-15: qty 10

## 2024-11-15 MED ORDER — ONDANSETRON 4 MG PO TBDP: 4.0000 mg | ORAL_TABLET | Freq: Once | ORAL | Status: DC | PRN

## 2024-11-15 MED ORDER — METRONIDAZOLE 500 MG/100ML IV SOLN
500.0000 mg | Freq: Once | INTRAVENOUS | Status: AC
  Administered 2024-11-15: 500 mg via INTRAVENOUS
  Filled 2024-11-15: qty 100

## 2024-11-15 MED ORDER — SODIUM CHLORIDE 0.9 % IV SOLN
2.0000 g | Freq: Once | INTRAVENOUS | Status: AC
  Administered 2024-11-15: 2 g via INTRAVENOUS
  Filled 2024-11-15: qty 20

## 2024-11-15 MED ORDER — METRONIDAZOLE 500 MG PO TABS: 500.0000 mg | ORAL_TABLET | Freq: Four times a day (QID) | ORAL | 0 refills | Status: DC

## 2024-11-15 MED ORDER — CIPROFLOXACIN HCL 500 MG PO TABS: 500.0000 mg | ORAL_TABLET | Freq: Two times a day (BID) | ORAL | 0 refills | Status: DC

## 2024-11-15 NOTE — ED Notes (Signed)
 US  PIV placed L forearm 20g 1.88

## 2024-11-15 NOTE — Telephone Encounter (Signed)
 FYI Only or Action Required?: FYI only for provider: ED advised.  Patient was last seen in primary care on 10/11/2024 by [name] Arlyss RAMAN, MD.  Called Nurse Triage reporting Abdominal Pain.  Symptoms began yesterday. Last night   Interventions attempted: Nothing.  Symptoms are: rapidly worsening.  Triage Disposition: Go to ED Now (Notify PCP)  Patient/caregiver understands and will follow disposition?: Yes    Instructed patient to go to ED for evaluation and to call 911 if worsening sx or driver needed.  Patient denies dizziness no vomiting. Reports still having abdominal pain but not as severe as last night. S/p hip replacement surgery 2 weeks ago .             Copied from CRM #[id number]. Topic: Clinical - Red Word Triage >> Nov 15, 2024  9:07 AM Rea ORN wrote: Red Word that prompted transfer to Nurse Triage: stomach pain and bloody loose stool that began last night Reason for Disposition  [1] MODERATE rectal bleeding (e.g., small blood clots, passing blood without stool, or toilet water turns red) AND [2] more than once a day  Answer Assessment - Initial Assessment Questions Recommended ED and call 911 if no one to drive.       1. APPEARANCE of BLOOD: What color is it? Is it passed separately, on the surface of the stool, or mixed in with the stool?      Mixed in stool and blood clots noted in commode 2. AMOUNT: How much blood was passed?      Commode was full of blood at one time 3. FREQUENCY: How many times has blood been passed with the stools?      More than couple of times since last night  4. ONSET: When was the blood first seen in the stools? (Days or weeks)      Last night  5. DIARRHEA: Is there also some diarrhea? If Yes, ask: How many diarrhea stools in the past 24 hours?      Yes started as constipation and ended  up with loose stool and clots  6. CONSTIPATION: Do you have constipation? If Yes, ask: How bad is it?     Yes  7.  RECURRENT SYMPTOMS: Have you had blood in your stools before? If Yes, ask: When was the last time? and What happened that time?      no 8. BLOOD THINNERS: Do you take any blood thinners? (e.g., aspirin, clopidogrel / Plavix, coumadin, heparin). Notes: Other strong blood thinners include: Arixtra (fondaparinux), Eliquis (apixaban), Pradaxa (dabigatran), and Xarelto (rivaroxaban).     Taking baby ASA x 2 weeks since hip surgery  9. OTHER SYMPTOMS: Do you have any other symptoms?  (e.g., abdomen pain, vomiting, dizziness, fever)     Abdominal pain persists and constant but not as bad as last night. Blood in stool and commode more than 1 time with clots noted. No N/V at this time. Last night reports severe abdominal pain with breaking out into sweat and nausea but no vomiting.  10. PREGNANCY: Is there any chance you are pregnant? When was your last menstrual period?       na  Protocols used: Rectal Bleeding-A-AH

## 2024-11-15 NOTE — Discharge Instructions (Addendum)
 You were evaluated in the emergency room for abdominal pain and bloody stools.  You were found to have colitis.  A prescription for antibiotics was sent into your pharmacy.  Please be sure to complete the full course of antibiotics.  I would recommend following up with your primary care doctor.  You may still notice intermittent bleeding over the next few days.  If you experience any new or worsening symptoms including persistent vomiting and worsening fevers please return to the emergency room.

## 2024-11-15 NOTE — ED Provider Notes (Signed)
 [place] [facility]ERG[facility] DEPARTMENT AT [facility] Provider Note   CSN: [id number] Arrival date & time: 11/15/24  1248     Patient presents with: Abdominal Pain and Rectal Bleeding   Dana Harding is a 73 y.o. female status post left THA on ASA twice daily presents with complaints of abdominal pain with associated rectal bleeding that started last night.  Reports bleeding is described as  comes and red.  Has had nausea as well without vomiting.  No history of GI bleed.  Last colonoscopy was a few years ago and reportedly unremarkable.  No urinary or vaginal complaints.    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 Lynwood Nasuti   IBS (irritable bowel syndrome)    Inflammatory arthritis    H/o elevated ANA, treated with plaquenil   Lumbar foraminal stenosis 02/09/2007   mild; L4-5; per MRI   Mitral regurgitation 03/23/2007   mild   Obesity    Obstructive sleep apnea (adult) (pediatric)    prev used CPAP, resolved and off CPAP as of 2016   Tricuspid regurgitation 03/23/2007   mild to moderate       Prior to Admission medications  Medication Sig Start Date End Date Taking? Authorizing Provider  ciprofloxacin (CIPRO) 500 MG tablet Take 1 tablet (500 mg total) by mouth every 12 (twelve) hours. 11/15/24  Yes Donnajean Lynwood DEL, PA-C  metroNIDAZOLE (FLAGYL) 500 MG tablet Take 1 tablet (500 mg total) by mouth 4 (four)  times daily. 11/15/24  Yes Donnajean Lynwood DEL, PA-C  ALPRAZolam  (XANAX ) 0.5 MG tablet TAKE HALF TO ONE TABLET BY MOUTH DAILY AS NEEDED FOR ANXIETY Patient taking differently: As needede 06/06/24   [name] Arlyss RAMAN, MD  Ascorbic Acid (VITAMIN C) 1000 MG tablet Take 1,000 mg by mouth daily.    [provider]  azelastine  (ASTELIN ) 0.1 % nasal spray Place 1-2 sprays into both nostrils 2 (two) times daily as needed for rhinitis. 01/14/20   [name] Arlyss RAMAN, MD  B Complex Vitamins (VITAMIN B COMPLEX PO) Take 1 tablet by mouth every other day.    [provider]  carboxymethylcellulose (REFRESH PLUS) 0.5 % SOLN Place 1 drop into both eyes in the morning and at bedtime.    [provider]  Cholecalciferol (VITAMIN D-3) 125 MCG (5000 UT) TABS Take 2,000 Units by mouth every other day.    [provider]  clidinium-chlordiazePOXIDE  (LIBRAX) 5-2.5 MG capsule TAKE ONE CAPSULE BY MOUTH THREE TIMES DAILY AS NEEDED 09/03/24   [name] Arlyss RAMAN, MD  cyclobenzaprine  (FLEXERIL ) 10 MG tablet Take 0.5-1 tablets (5-10 mg total) by mouth 3 (three) times daily as needed for muscle spasms. 03/13/24   [name] Arlyss RAMAN, MD  escitalopram  (LEXAPRO ) 10 MG tablet Take 1 tablet (10 mg total) by mouth daily. 10/11/24   [name] Arlyss RAMAN, MD  Fluocinolone Acetonide Scalp 0.01 % OIL Apply  1 application topically daily as needed for irritation. 04/30/20   [provider]  fluticasone (FLONASE) 50 MCG/ACT nasal spray Place 1-2 sprays into both nostrils daily as needed for allergies or rhinitis.    [provider]  HYDROcodone -acetaminophen  (NORCO/VICODIN) 5-325 MG tablet Take 1 tablet by mouth 3 (three) times daily as needed for moderate pain (sedation caution). 02/18/22   [name] Arlyss RAMAN, MD  letrozole  (FEMARA ) 2.5 MG tablet Take 1 tablet (2.5 mg total) by mouth daily. 12/13/23   Iruku, Praveena, MD  Multiple Vitamins-Minerals (PRESERVISION AREDS 2 PO) Take 1 capsule by mouth in the morning and  at bedtime.    [provider]  naproxen  sodium (ALEVE ) 220 MG tablet Take 220 mg by mouth daily as needed (pain).    [provider]  Omega-3 Fatty Acids (OMEGA 3 PO) Take 1 capsule by mouth every other day.    [provider]  ondansetron  (ZOFRAN -ODT) 8 MG disintegrating tablet Take 1 tablet (8 mg total) by mouth 2 (two) times daily as needed for nausea or vomiting. 06/05/24   [name] Arlyss RAMAN, MD  oxybutynin  (DITROPAN  XL) 15 MG 24 hr tablet Take 1 tablet (15 mg total) by mouth at bedtime. 03/22/24   Rilla Baller, MD  zinc gluconate 50 MG tablet Take 50 mg by mouth daily as needed (cold symptoms).    [provider]    Allergies: Latex, Aspirin, and Prilosec [omeprazole ]    Review of Systems  Gastrointestinal:  Positive for abdominal pain and hematochezia.    Updated Vital Signs BP (!) 152/79 (BP Location: Right Arm)   Pulse 93   Temp 98 F (36.7 C)   Resp 18   SpO2 100%   Physical Exam Vitals and nursing note reviewed.  Constitutional:      General: She is not in acute distress.    Appearance: She is well-developed.  HENT:     Head: Normocephalic and atraumatic.  Eyes:     Conjunctiva/sclera: Conjunctivae normal.  Cardiovascular:     Rate and Rhythm: Normal rate and regular rhythm.     Heart sounds: No murmur heard. Pulmonary:     Effort: Pulmonary effort is normal. No respiratory distress.     Breath sounds: Normal breath sounds.  Abdominal:     Palpations: Abdomen is soft.     Tenderness: There is abdominal tenderness.     Comments: Mild generalized abdominal tenderness, soft nondistended  Musculoskeletal:        General: No swelling.     Cervical back: Neck supple.  Skin:    General: Skin is warm and dry.     Capillary Refill: Capillary refill takes less than 2 seconds.  Neurological:     Mental Status: She is alert.  Psychiatric:        Mood and Affect: Mood normal.     (all labs ordered are listed, but only abnormal  results are displayed) Labs Reviewed  COMPREHENSIVE METABOLIC PANEL WITH GFR - Abnormal; Notable for the following components:      Result Value   Glucose, Bld 107 (*)    Alkaline Phosphatase 160 (*)    All other components within normal limits  CBC - Abnormal; Notable for the following components:   WBC 12.4 (*)    Platelets 483 (*)    All other components within normal limits  URINALYSIS, ROUTINE W REFLEX MICROSCOPIC - Abnormal; Notable for the following components:   Hgb urine dipstick MODERATE (*)    Ketones, ur  5 (*)    Leukocytes,Ua TRACE (*)    Bacteria, UA RARE (*)    All other components within normal limits  POC OCCULT BLOOD, ED - Abnormal; Notable for the following components:   Fecal Occult Bld POSITIVE (*)    All other components within normal limits  RESP PANEL BY RT-PCR (RSV, FLU A&B, COVID)  RVPGX2  CULTURE, BLOOD (ROUTINE X 2)  CULTURE, BLOOD (ROUTINE X 2)  LIPASE, BLOOD  PROTIME-INR  I-STAT CG4 LACTIC ACID, ED  TYPE AND SCREEN  ABO/RH    EKG: EKG Interpretation Date/Time:  Thursday November 15 2024 14:46:17 EST Ventricular Rate:  94 PR Interval:  150 QRS Duration:  82 QT Interval:  383 QTC Calculation: 479 R Axis:   20  Text Interpretation: Sinus rhythm Confirmed by Elnor Savant ([id number]) on 11/15/2024 3:29:00 PM  Radiology: CT ABDOMEN PELVIS W CONTRAST Result Date: 11/15/2024 EXAM: CT ABDOMEN AND PELVIS WITH CONTRAST 11/15/2024 05:09:31 PM TECHNIQUE: CT of the abdomen and pelvis was performed with the administration of 100 mL of iohexol  (OMNIPAQUE ) 300 MG/ML solution. Multiplanar reformatted images are provided for review. Automated exposure control, iterative reconstruction, and/or weight-based adjustment of the mA/kV was utilized to reduce the radiation dose to as low as reasonably achievable. COMPARISON: 05/19/2023 CLINICAL HISTORY: Abdominal pain, acute, nonlocalized. FINDINGS: LOWER CHEST: No acute abnormality. LIVER: The liver is unremarkable.  GALLBLADDER AND BILE DUCTS: Status post cholecystectomy. No biliary ductal dilatation. SPLEEN: No acute abnormality. PANCREAS: No acute abnormality. ADRENAL GLANDS: Stable 14 mm left adrenal nodule (image 22), previously characterized as a benign adrenal adenoma. KIDNEYS, URETERS AND BLADDER: No stones in the kidneys or ureters. No hydronephrosis. No perinephric or periureteral stranding. Urinary bladder is unremarkable. GI AND BOWEL: Postsurgical changes at the GE junction. Long segment wall thickening involving the descending colon with mild pericolonic stranding (image 35), suggesting infectious/inflammatory colitis. No pneumatosis or free air. There is no bowel obstruction. PERITONEUM AND RETROPERITONEUM: No ascites. No free air. VASCULATURE: Aorta is normal in caliber. Mild atherosclerotic calcifications of the aorta. LYMPH NODES: No lymphadenopathy. REPRODUCTIVE ORGANS: Calcified left adnexal lesion, unchanged, favoring benign etiology a broad ligament or pedunculated fibroid. BONES AND SOFT TISSUES: Left hip arthroplasty. No acute osseous abnormality. No focal soft tissue abnormality. IMPRESSION: 1. Infectious/inflammatory colitis involving the descending colon. 2. No pneumatosis or free intraperitoneal air. Electronically signed by: Pinkie Pebbles MD 11/15/2024 05:25 PM EST RP Workstation: [id number]     Procedures   Medications Ordered in the ED  lactated ringers  infusion ( Intravenous New Bag/Given 11/15/24 1437)  pantoprazole (PROTONIX) injection 40 mg (40 mg Intravenous Given 11/15/24 1404)  ondansetron  (ZOFRAN ) injection 4 mg (4 mg Intravenous Given 11/15/24 1404)  acetaminophen  (TYLENOL ) tablet 1,000 mg (1,000 mg Oral Given 11/15/24 1446)  lactated ringers  bolus 1,000 mL (1,000 mLs Intravenous New Bag/Given 11/15/24 1438)  cefTRIAXone  (ROCEPHIN ) 2 g in sodium chloride  0.9 % 100 mL IVPB (0 g Intravenous Stopped 11/15/24 1539)  metroNIDAZOLE (FLAGYL) IVPB 500 mg (500 mg Intravenous New  Bag/Given 11/15/24 1609)  iohexol  (OMNIPAQUE ) 300 MG/ML solution 100 mL (100 mLs Intravenous Contrast Given 11/15/24 1655)    Clinical Course as of 11/15/24 1814  Thu Nov 15, 2024  1350 Patient status post THA on ASA evaluated for generalized abdominal pain with nausea and rectal bleeding that started last night, described as crimson.  Upon arrival patient is tachycardic otherwise hemodynamically stable.  Will obtain routine labs and CT imaging. [JT]  1351 CBC(!) Leukocytosis of 12.4, hemoglobin stable [JT]  1359 Rectal temp 100.5.  With tachycardia and leukocytosis will add on lactic and blood cultures and cover empirically with broad-spectrum antibiotics. [JT]  1426 POC occult blood, ED(!) Positive [JT]  1757 Urinalysis, Routine w reflex microscopic -Urine, Clean Catch(!) Rare bacteria, 0-5 WBCs with trace leukocytes and negative nitrites.  No urinary symptoms. [JT]  1758 Resp panel by RT-PCR (RSV, Flu A&B, Covid) Anterior Nasal Swab [JT]  1758 Resp panel by RT-PCR (RSV, Flu A&B, Covid) Anterior Nasal Swab Negative [JT]  1758 CT ABDOMEN PELVIS W CONTRAST Infectious/inflammatory colitis involving the descending colon [JT]  1802 Workup consistent with infectious colitis.  Prescription for Cipro and Flagyl sent into pharmacy.  Patient discharged home on strict return precautions and close follow-up with PCP.  Discussed patient with Dr. Patt, agreed with plan. [JT]    Clinical Course User Index [JT] Donnajean Lynwood DEL, PA-C                                 Medical Decision Making Amount and/or Complexity of Data Reviewed Labs: ordered.  Risk Prescription drug management.   This patient presents to the ED with chief complaint(s) of abdominal pain .  The complaint involves an extensive differential diagnosis and also carries with it a high risk of complications and morbidity.   Pertinent past medical history as listed in HPI  The differential diagnosis includes  Diverticulitis, GI  bleed, gastroenteritis, AAA, appendicitis, cholecystitis Additional history obtained: Additional history obtained from family Records reviewed Care Everywhere/External Records  Disposition:   Patient will be discharged home. The patient has been appropriately medically screened and/or stabilized in the ED. I have low suspicion for any other emergent medical condition which would require further screening, evaluation or treatment in the ED or require inpatient management. At time of discharge the patient is hemodynamically stable and in no acute distress. I have discussed work-up results and diagnosis with patient and answered all questions. Patient is agreeable with discharge plan. We discussed strict return precautions for returning to the emergency department and they verbalized understanding.     Social Determinants of Health:   none  This note was dictated with voice recognition software.  Despite best efforts at proofreading, errors may have occurred which can change the documentation meaning.       Final diagnoses:  Colitis    ED Discharge Orders          Ordered    ciprofloxacin (CIPRO) 500 MG tablet  Every 12 hours        11/15/24 1812    metroNIDAZOLE (FLAGYL) 500 MG tablet  4 times daily        11/15/24 1812               Donnajean Lynwood DEL, PA-C 11/15/24 1814    Elnor Jayson LABOR, DO 11/16/24 1513

## 2024-11-15 NOTE — Telephone Encounter (Signed)
Agree with ER.  Thanks.  

## 2024-11-15 NOTE — ED Triage Notes (Signed)
 Pt presents with lower abd that started yesterday, has been having episodes of loose and hard stools. Pt reports large blot clot in stool that was dark red, and has now been loose bloody stool that is a lighter red in color. Pt has been nauseous.

## 2024-11-20 LAB — CULTURE, BLOOD (ROUTINE X 2)
Culture: NO GROWTH
Culture: NO GROWTH

## 2024-12-07 ENCOUNTER — Other Ambulatory Visit: Payer: Self-pay | Admitting: Hematology and Oncology

## 2024-12-14 ENCOUNTER — Ambulatory Visit (INDEPENDENT_AMBULATORY_CARE_PROVIDER_SITE_OTHER)

## 2024-12-14 VITALS — BP 118/80 | HR 73 | Ht 67.0 in | Wt 158.8 lb

## 2024-12-14 DIAGNOSIS — Z0000 Encounter for general adult medical examination without abnormal findings: Secondary | ICD-10-CM

## 2024-12-14 NOTE — Patient Instructions (Addendum)
 Dana Harding,  Thank you for taking the time for your Medicare Wellness Visit. I appreciate your continued commitment to your health goals. Please review the care plan we discussed, and feel free to reach out if I can assist you further.  Please note that Annual Wellness Visits do not include a physical exam. Some assessments may be limited, especially if the visit was conducted virtually. If needed, we may recommend an in-person follow-up with your provider.  Ongoing Care Seeing your primary care provider every 3 to 6 months helps us  monitor your health and provide consistent, personalized care.   Referrals If a referral was made during today's visit and you haven't received any updates within two weeks, please contact the referred provider directly to check on the status.  Recommended Screenings:  Health Maintenance  Topic Date Due   COVID-19 Vaccine (11 - Pfizer risk 2025-26 season) 03/20/2025   Breast Cancer Screening  09/19/2025   Osteoporosis screening with Bone Density Scan  11/14/2025   Medicare Annual Wellness Visit  12/14/2025   DTaP/Tdap/Td vaccine (3 - Td or Tdap) 01/02/2029   Colon Cancer Screening  03/08/2033   Pneumococcal Vaccine for age over 23  Completed   Flu Shot  Completed   Hepatitis C Screening  Completed   Zoster (Shingles) Vaccine  Completed   Meningitis B Vaccine  Aged Out       12/14/2024   12:51 PM  Advanced Directives  Does Patient Have a Medical Advance Directive? Yes  Type of Estate Agent of [place];Living will  Does patient want to make changes to medical advance directive? Yes (Inpatient - patient requests chaplain consult to change a medical advance directive)  Copy of Healthcare Power of Attorney in Chart? No - copy requested    Vision: Annual vision screenings are recommended for early detection of glaucoma, cataracts, and diabetic retinopathy. These exams can also reveal signs of chronic conditions such as diabetes and  high blood pressure.  Dental: Annual dental screenings help detect early signs of oral cancer, gum disease, and other conditions linked to overall health, including heart disease and diabetes.

## 2024-12-14 NOTE — Progress Notes (Signed)
 "  Chief Complaint  Patient presents with   Medicare Wellness     Subjective:  Please attest and cosign this visit due to patients primary care provider not being in the office at the time the visit was completed.  (Pt of Dr Arlyss RAMAN. [name])   Dana Harding is a 74 y.o. female who presents for a Medicare Annual Wellness Visit.  Visit info / Clinical Intake: Medicare Wellness Visit Type:: Subsequent Annual Wellness Visit Persons participating in visit and providing information:: patient Medicare Wellness Visit Mode:: In-person (required for WTM) Interpreter Needed?: No Pre-visit prep was completed: yes AWV questionnaire completed by patient prior to visit?: yes Date:: 12/13/24 Living arrangements:: (!) lives alone Patient's Overall Health Status Rating: very good Typical amount of pain: some Does pain affect daily life?: no Are you currently prescribed opioids?: (!) yes  Dietary Habits and Nutritional Risks How many meals a day?: 2 Eats fruit and vegetables daily?: yes Most meals are obtained by: preparing own meals; eating out In the last 2 weeks, have you had any of the following?: none Diabetic:: no  Functional Status Activities of Daily Living (to include ambulation/medication): Independent Ambulation: Independent with device- listed below Home Assistive Devices/Equipment: Johna Finder (specify Type); Eyeglasses Medication Administration: Independent Home Management (perform basic housework or laundry): Independent Manage your own finances?: yes Primary transportation is: driving Concerns about vision?: no *vision screening is required for WTM* Concerns about hearing?: no  Fall Screening Falls in the past year?: 0 Number of falls in past year: 0 Was there an injury with Fall?: 0 Fall Risk Category Calculator: 0 Patient Fall Risk Level: Low Fall Risk  Fall Risk Patient at Risk for Falls Due to: No Fall Risks Fall risk Follow up: Falls evaluation completed; Falls  prevention discussed  Home and Transportation Safety: All rugs have non-skid backing?: yes All stairs or steps have railings?: yes Grab bars in the bathtub or shower?: (!) no Have non-skid surface in bathtub or shower?: yes Good home lighting?: yes Regular seat belt use?: yes Hospital stays in the last year:: no  Cognitive Assessment Difficulty concentrating, remembering, or making decisions? : no Will 6CIT or Mini Cog be Completed: yes What year is it?: 0 points What month is it?: 0 points Give patient an address phrase to remember (5 components): [address] [place], [place] About what time is it?: 0 points Count backwards from 20 to 1: 0 points Say the months of the year in reverse: 0 points Repeat the address phrase from earlier: 2 points (27) 6 CIT Score: 2 points  Advance Directives (For Healthcare) Does Patient Have a Medical Advance Directive?: Yes Does patient want to make changes to medical advance directive?: Yes (Inpatient - patient requests chaplain consult to change a medical advance directive) Type of Advance Directive: Healthcare Power of [place]; Living will Copy of Healthcare Power of Attorney in Chart?: No - copy requested Copy of Living Will in Chart?: No - copy requested Would patient like information on creating a medical advance directive?: No - Patient declined  Reviewed/Updated  Reviewed/Updated: Reviewed All (Medical, Surgical, Family, Medications, Allergies, Care Teams, Patient Goals)    Allergies (verified) Latex, Aspirin, and Prilosec [omeprazole ]   Current Medications (verified) Outpatient Encounter Medications as of 12/14/2024  Medication Sig   ALPRAZolam  (XANAX ) 0.5 MG tablet TAKE HALF TO ONE TABLET BY MOUTH DAILY AS NEEDED FOR ANXIETY (Patient taking differently: As needede)   Ascorbic Acid (VITAMIN C) 1000 MG tablet Take 1,000 mg by mouth  daily.   azelastine  (ASTELIN ) 0.1 % nasal spray Place 1-2 sprays into both nostrils 2 (two) times daily  as needed for rhinitis.   B Complex Vitamins (VITAMIN B COMPLEX PO) Take 1 tablet by mouth every other day.   carboxymethylcellulose (REFRESH PLUS) 0.5 % SOLN Place 1 drop into both eyes in the morning and at bedtime.   Cholecalciferol (VITAMIN D-3) 125 MCG (5000 UT) TABS Take 2,000 Units by mouth every other day.   clidinium-chlordiazePOXIDE  (LIBRAX) 5-2.5 MG capsule TAKE ONE CAPSULE BY MOUTH THREE TIMES DAILY AS NEEDED   cyclobenzaprine  (FLEXERIL ) 10 MG tablet Take 0.5-1 tablets (5-10 mg total) by mouth 3 (three) times daily as needed for muscle spasms.   Fluocinolone Acetonide Scalp 0.01 % OIL Apply 1 application topically daily as needed for irritation.   fluticasone (FLONASE) 50 MCG/ACT nasal spray Place 1-2 sprays into both nostrils daily as needed for allergies or rhinitis.   HYDROcodone -acetaminophen  (NORCO/VICODIN) 5-325 MG tablet Take 1 tablet by mouth 3 (three) times daily as needed for moderate pain (sedation caution).   letrozole  (FEMARA ) 2.5 MG tablet Take 1 tablet (2.5 mg total) by mouth daily.   methocarbamol (ROBAXIN) 500 MG tablet Take 500 mg by mouth 4 (four) times daily.   Multiple Vitamins-Minerals (PRESERVISION AREDS 2 PO) Take 1 capsule by mouth in the morning and at bedtime.   naproxen  sodium (ALEVE ) 220 MG tablet Take 220 mg by mouth daily as needed (pain).   Omega-3 Fatty Acids (OMEGA 3 PO) Take 1 capsule by mouth every other day.   ondansetron  (ZOFRAN -ODT) 8 MG disintegrating tablet Take 1 tablet (8 mg total) by mouth 2 (two) times daily as needed for nausea or vomiting.   oxybutynin  (DITROPAN  XL) 15 MG 24 hr tablet Take 1 tablet (15 mg total) by mouth at bedtime.   traMADol (ULTRAM) 50 MG tablet Take 50 mg by mouth every 6 (six) hours as needed for moderate pain (pain score 4-6).   zinc gluconate 50 MG tablet Take 50 mg by mouth daily as needed (cold symptoms).   ciprofloxacin  (CIPRO ) 500 MG tablet Take 1 tablet (500 mg total) by mouth every 12 (twelve) hours.    escitalopram  (LEXAPRO ) 10 MG tablet Take 1 tablet (10 mg total) by mouth daily. (Patient not taking: Reported on 12/14/2024)   metroNIDAZOLE  (FLAGYL ) 500 MG tablet Take 1 tablet (500 mg total) by mouth 4 (four) times daily. (Patient not taking: Reported on 12/14/2024)   No facility-administered encounter medications on file as of 12/14/2024.    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 Lynwood Nasuti   IBS (irritable bowel syndrome)    Inflammatory arthritis    H/o elevated ANA, treated with plaquenil   Lumbar foraminal stenosis 02/09/2007   mild; L4-5; per MRI   Mitral regurgitation 03/23/2007   mild   Obesity    Obstructive sleep apnea (adult) (pediatric)    prev used CPAP, resolved and off CPAP as of 2016  Tricuspid regurgitation 03/23/2007   mild to moderate   Past Surgical History:  Procedure Laterality Date   BLEPHAROPLASTY     BREAST LUMPECTOMY WITH RADIOACTIVE SEED LOCALIZATION Right 09/14/2023   Procedure: RIGHT BREAST LUMPECTOMY WITH RADIOACTIVE SEED LOCALIZATION;  Surgeon: Vanderbilt Ned, MD;  Location: [facility] OR;  Service: General;  Laterality: Right;   BREAST REDUCTION SURGERY  2009   CATARACT EXTRACTION Left 01/01/2019   CATARACT EXTRACTION W/ INTRAOCULAR LENS IMPLANT Right 12/11/2018   CHOLECYSTECTOMY  1980's   COSMETIC SURGERY  2021   lower blepharoplasty   EYE SURGERY  2020   cataract surgery   INGUINAL HERNIA REPAIR Bilateral    x2   LAPAROSCOPIC GASTRIC BANDING  2007   removed   SHOULDER ARTHROSCOPY WITH  ROTATOR CUFF REPAIR AND SUBACROMIAL DECOMPRESSION Left 02/22/2013   Procedure: LEFT SHOULDER ARTHROSCOPY WITH SUBACROMIAL DECOMPRESSION DISTAL CLAVICLE RESECTION with ROTATOR CUFF REPAIR  ;  Surgeon: Franky CHRISTELLA Pointer, MD;  Location: [facility] OR;  Service: Orthopedics;  Laterality: Left;   TONSILLECTOMY AND ADENOIDECTOMY  1960   tummy tuck  1996   UMBILICAL HERNIA REPAIR     As a child   Family History  Problem Relation Age of Onset   Breast cancer Mother 79       bilateral   Cancer Mother    Stroke Father        < 60   Heart disease Father    Kidney disease Father    Bladder Cancer Sister 7   Cancer Sister 66       Neuroendocrine tumor found incidentally in the small intestine   Colon cancer Paternal Uncle    Social History   Occupational History   Not on file  Tobacco Use   Smoking status: Former    Current packs/day: 0.00    Average packs/day: 1 pack/day for 4.0 years (4.0 ttl pk-yrs)    Types: Cigarettes    Start date: 12/06/1968    Quit date: 12/06/1972    Years since quitting: 52.0   Smokeless tobacco: Never   Tobacco comments:    Quit in 1980  Vaping Use   Vaping status: Never Used  Substance and Sexual Activity   Alcohol use: Yes    Alcohol/week: 2.0 standard drinks of alcohol    Types: 1 Glasses of wine, 1 Standard drinks or equivalent per week    Comment: Socially   Drug use: No   Sexual activity: Not Currently    Birth control/protection: Post-menopausal   Tobacco Counseling Counseling given: Yes Tobacco comments: Quit in 1980  SDOH Screenings   Food Insecurity: No Food Insecurity (12/14/2024)  Housing: Unknown (12/14/2024)  Transportation Needs: No Transportation Needs (12/14/2024)  Utilities: Not At Risk (12/14/2024)  Alcohol Screen: Low Risk (12/13/2024)  Depression (PHQ2-9): Low Risk (12/14/2024)  Financial Resource Strain: Low Risk (12/13/2024)  Physical Activity: Insufficiently Active (12/14/2024)  Social Connections: Socially Isolated (12/14/2024)  Stress: No Stress  Concern Present (12/14/2024)  Tobacco Use: Medium Risk (12/14/2024)  Health Literacy: Adequate Health Literacy (12/14/2024)   See flowsheets for full screening details  Depression Screen PHQ 2 & 9 Depression Scale- Over the past 2 weeks, how often have you been bothered by any of the following problems? Little interest or pleasure in doing things: 0 Feeling down, depressed, or hopeless (PHQ Adolescent also includes...irritable): 0 PHQ-2 Total Score: 0 Trouble falling or staying asleep, or sleeping too much: 0 Feeling tired or having little energy: 0 Poor appetite or overeating (PHQ Adolescent also includes...weight  loss): 0 Feeling bad about yourself - or that you are a failure or have let yourself or your family down: 0 Trouble concentrating on things, such as reading the newspaper or watching television (PHQ Adolescent also includes...like school work): 0 Moving or speaking so slowly that other people could have noticed. Or the opposite - being so fidgety or restless that you have been moving around a lot more than usual: 3 (recovering from rt hip surgery) Thoughts that you would be better off dead, or of hurting yourself in some way: 0 PHQ-9 Total Score: 3 If you checked off any problems, how difficult have these problems made it for you to do your work, take care of things at home, or get along with other people?: Not difficult at all  Depression Treatment Depression Interventions/Treatment : EYV7-0 Score <4 Follow-up Not Indicated     Goals Addressed               This Visit's Progress     Patient Stated (pt-stated)        Patient stated she plans to continue recovering from left hip surgery             Objective:    Today's Vitals   12/14/24 1249  BP: 118/80  Pulse: 73  SpO2: 94%  Weight: 158 lb 12.8 oz (72 kg)  Height: 5' 7 (1.702 m)   Body mass index is 24.87 kg/m.  Hearing/Vision screen Hearing Screening - Comments:: Denies hearing difficulties   Vision  Screening - Comments:: Wears rx glasses - up to date with routine eye exams with Charlie Glendia Gaudy Immunizations and Health Maintenance Health Maintenance  Topic Date Due   COVID-19 Vaccine (11 - Pfizer risk 2025-26 season) 03/20/2025   Mammogram  09/19/2025   Bone Density Scan  11/14/2025   Medicare Annual Wellness (AWV)  12/14/2025   DTaP/Tdap/Td (3 - Td or Tdap) 01/02/2029   Colonoscopy  03/08/2033   Pneumococcal Vaccine: 50+ Years  Completed   Influenza Vaccine  Completed   Hepatitis C Screening  Completed   Zoster Vaccines- Shingrix  Completed   Meningococcal B Vaccine  Aged Out        Assessment/Plan:  This is a routine wellness examination for Nykiah.  Patient Care Team: [name] Arlyss RAMAN, MD as PCP - General Dasher, Lynwood, MD (Surgery) Kristie Lamprey, MD as Consulting Physician (Gastroenterology) Gaudy, Charlie Glendia, MD as Consulting Physician (Ophthalmology) Loretha Ash, MD as Consulting Physician (Hematology and Oncology) Vanderbilt Ned, MD as Consulting Physician (General Surgery) Shannon Lynwood, MD as Consulting Physician (Radiation Oncology)  I have personally reviewed and noted the following in the patients chart:   Medical and social history Use of alcohol, tobacco or illicit drugs  Current medications and supplements including opioid prescriptions. Functional ability and status Nutritional status Physical activity Advanced directives List of other physicians Hospitalizations, surgeries, and ER visits in previous 12 months Vitals Screenings to include cognitive, depression, and falls Referrals and appointments  No orders of the defined types were placed in this encounter.  In addition, I have reviewed and discussed with patient certain preventive protocols, quality metrics, and best practice recommendations. A written personalized care plan for preventive services as well as general preventive health recommendations were provided to  patient.   Verdie CHRISTELLA Saba, CMA   12/14/2024   Return in 1 year (on 12/14/2025).  After Visit Summary: (In Person-Declined) Patient declined AVS at this time.  Nurse Notes: scheduled 2027 AWV appt. "

## 2025-01-11 ENCOUNTER — Ambulatory Visit: Admitting: Family Medicine

## 2025-01-11 ENCOUNTER — Encounter: Payer: Self-pay | Admitting: Family Medicine

## 2025-01-11 VITALS — BP 120/82 | HR 88 | Temp 98.1°F | Ht 66.5 in | Wt 158.4 lb

## 2025-01-11 DIAGNOSIS — N644 Mastodynia: Secondary | ICD-10-CM

## 2025-01-11 DIAGNOSIS — E785 Hyperlipidemia, unspecified: Secondary | ICD-10-CM

## 2025-01-11 DIAGNOSIS — M8588 Other specified disorders of bone density and structure, other site: Secondary | ICD-10-CM

## 2025-01-11 DIAGNOSIS — M8580 Other specified disorders of bone density and structure, unspecified site: Secondary | ICD-10-CM

## 2025-01-11 LAB — VITAMIN D 25 HYDROXY (VIT D DEFICIENCY, FRACTURES): VITD: 31.8 ng/mL (ref 30.00–100.00)

## 2025-01-11 LAB — CBC WITH DIFFERENTIAL/PLATELET
Basophils Absolute: 0 10*3/uL (ref 0.0–0.1)
Basophils Relative: 0.8 % (ref 0.0–3.0)
Eosinophils Absolute: 0.2 10*3/uL (ref 0.0–0.7)
Eosinophils Relative: 3.1 % (ref 0.0–5.0)
HCT: 36.5 % (ref 36.0–46.0)
Hemoglobin: 11.8 g/dL — ABNORMAL LOW (ref 12.0–15.0)
Lymphocytes Relative: 40.7 % (ref 12.0–46.0)
Lymphs Abs: 2 10*3/uL (ref 0.7–4.0)
MCHC: 32.3 g/dL (ref 30.0–36.0)
MCV: 82.5 fl (ref 78.0–100.0)
Monocytes Absolute: 0.3 10*3/uL (ref 0.1–1.0)
Monocytes Relative: 5.6 % (ref 3.0–12.0)
Neutro Abs: 2.5 10*3/uL (ref 1.4–7.7)
Neutrophils Relative %: 49.8 % (ref 43.0–77.0)
Platelets: 316 10*3/uL (ref 150.0–400.0)
RBC: 4.42 Mil/uL (ref 3.87–5.11)
RDW: 16.1 % — ABNORMAL HIGH (ref 11.5–15.5)
WBC: 4.9 10*3/uL (ref 4.0–10.5)

## 2025-01-11 LAB — LIPID PANEL
Cholesterol: 241 mg/dL — ABNORMAL HIGH (ref 28–200)
HDL: 74.8 mg/dL
LDL Cholesterol: 148 mg/dL — ABNORMAL HIGH (ref 10–99)
NonHDL: 166.2
Total CHOL/HDL Ratio: 3
Triglycerides: 90 mg/dL (ref 10.0–149.0)
VLDL: 18 mg/dL (ref 0.0–40.0)

## 2025-01-11 LAB — COMPREHENSIVE METABOLIC PANEL WITH GFR
ALT: 15 U/L (ref 3–35)
AST: 19 U/L (ref 5–37)
Albumin: 4.3 g/dL (ref 3.5–5.2)
Alkaline Phosphatase: 90 U/L (ref 39–117)
BUN: 12 mg/dL (ref 6–23)
CO2: 30 meq/L (ref 19–32)
Calcium: 9.9 mg/dL (ref 8.4–10.5)
Chloride: 106 meq/L (ref 96–112)
Creatinine, Ser: 0.95 mg/dL (ref 0.40–1.20)
GFR: 60 mL/min — ABNORMAL LOW
Glucose, Bld: 91 mg/dL (ref 70–99)
Potassium: 4.8 meq/L (ref 3.5–5.1)
Sodium: 142 meq/L (ref 135–145)
Total Bilirubin: 0.5 mg/dL (ref 0.2–1.2)
Total Protein: 6.8 g/dL (ref 6.0–8.3)

## 2025-01-11 LAB — TSH: TSH: 1.03 u[IU]/mL (ref 0.35–5.50)

## 2025-01-11 MED ORDER — AZELASTINE HCL 0.1 % NA SOLN: 1.0000 | Freq: Two times a day (BID) | NASAL | 3 refills | Status: AC | PRN

## 2025-01-11 MED ORDER — ALPRAZOLAM 0.5 MG PO TABS: 0.2500 mg | ORAL_TABLET | Freq: Every day | ORAL | Status: AC | PRN

## 2025-01-11 NOTE — Progress Notes (Unsigned)
 Weight is lower.  She had colitis and had sig joint pain.   No more blood in stool.  Done with abx.  She improved in the meantime.  No abd pain.  No fevers.  No black stools.   She didn't need to fill lexapro  and mood improved in the meantime.    D/w pt about flonase and astelin .  Still has rhinitis in spite of flonase.  D/w pt about restart astelin .    Her hip is improving, doing well.  She had ortho f/u.  Some aches in the cold weather.  She is better than preop status.    She has some R breast soreness.  Started about a few weeks ago.  Locally sore to the touch.  No mass felt.  Ttp medially and on upper portion of the breast.  No discharge redness or bruising.  Still on letrozole .  She has hot flashes from that med.    H/o HLD.  Recheck labs pending.   Osteopenia, low fx risk. Vit D pending.   Flu 2025 Shingles 2020 PNA-20 done at office visit 07/2021 Tetanus 2020 COVID-vaccine up-to-date. Colonoscopy 2024 Breast cancer screening 2025 Bone density test done 2024. Advance directive discussed with patient.  Daughter (Dr. Joeann) designated if patient were incapacitated.  Meds, vitals, and allergies reviewed.   ROS: Per HPI unless specifically indicated in ROS section

## 2025-01-11 NOTE — Patient Instructions (Addendum)
 Go to the lab on the way out.   If you have mychart we'll likely use that to update you.    I'll update oncology.  Let me know if you can't get set up with the follow up mammogram.  Take care.  Glad to see you.  If you have more symptoms then please let me know.

## 2025-12-16 ENCOUNTER — Ambulatory Visit
